# Patient Record
Sex: Female | Born: 1960 | Race: White | Hispanic: No | Marital: Married | Smoking: Never smoker
Health system: Southern US, Community
[De-identification: ages and names within clinical notes are randomized; demographics above are authoritative.]

## PROBLEM LIST (undated history)

## (undated) DIAGNOSIS — Z800 Family history of malignant neoplasm of digestive organs: Secondary | ICD-10-CM

## (undated) DIAGNOSIS — Z8042 Family history of malignant neoplasm of prostate: Secondary | ICD-10-CM

## (undated) DIAGNOSIS — S82899A Other fracture of unspecified lower leg, initial encounter for closed fracture: Secondary | ICD-10-CM

## (undated) DIAGNOSIS — E785 Hyperlipidemia, unspecified: Secondary | ICD-10-CM

## (undated) DIAGNOSIS — I517 Cardiomegaly: Secondary | ICD-10-CM

## (undated) DIAGNOSIS — I700 Atherosclerosis of aorta: Secondary | ICD-10-CM

## (undated) DIAGNOSIS — Z923 Personal history of irradiation: Secondary | ICD-10-CM

## (undated) DIAGNOSIS — K429 Umbilical hernia without obstruction or gangrene: Secondary | ICD-10-CM

## (undated) DIAGNOSIS — E669 Obesity, unspecified: Secondary | ICD-10-CM

## (undated) DIAGNOSIS — Z808 Family history of malignant neoplasm of other organs or systems: Secondary | ICD-10-CM

## (undated) DIAGNOSIS — K4090 Unilateral inguinal hernia, without obstruction or gangrene, not specified as recurrent: Secondary | ICD-10-CM

## (undated) DIAGNOSIS — G709 Myoneural disorder, unspecified: Secondary | ICD-10-CM

## (undated) DIAGNOSIS — Z803 Family history of malignant neoplasm of breast: Secondary | ICD-10-CM

## (undated) DIAGNOSIS — Z9989 Dependence on other enabling machines and devices: Secondary | ICD-10-CM

## (undated) DIAGNOSIS — Z8371 Family history of colonic polyps: Secondary | ICD-10-CM

## (undated) DIAGNOSIS — F419 Anxiety disorder, unspecified: Secondary | ICD-10-CM

## (undated) DIAGNOSIS — G629 Polyneuropathy, unspecified: Secondary | ICD-10-CM

## (undated) DIAGNOSIS — Z9221 Personal history of antineoplastic chemotherapy: Secondary | ICD-10-CM

## (undated) DIAGNOSIS — I10 Essential (primary) hypertension: Secondary | ICD-10-CM

## (undated) DIAGNOSIS — C541 Malignant neoplasm of endometrium: Secondary | ICD-10-CM

## (undated) DIAGNOSIS — D649 Anemia, unspecified: Secondary | ICD-10-CM

## (undated) DIAGNOSIS — Z8719 Personal history of other diseases of the digestive system: Secondary | ICD-10-CM

## (undated) DIAGNOSIS — Z83719 Family history of colon polyps, unspecified: Secondary | ICD-10-CM

## (undated) HISTORY — DX: Family history of malignant neoplasm of other organs or systems: Z80.8

## (undated) HISTORY — DX: Obesity, unspecified: E66.9

## (undated) HISTORY — DX: Personal history of irradiation: Z92.3

## (undated) HISTORY — DX: Family history of colon polyps, unspecified: Z83.719

## (undated) HISTORY — PX: CHOLECYSTECTOMY: SHX55

## (undated) HISTORY — PX: ANKLE FRACTURE SURGERY: SHX122

## (undated) HISTORY — DX: Family history of malignant neoplasm of breast: Z80.3

## (undated) HISTORY — DX: Hyperlipidemia, unspecified: E78.5

## (undated) HISTORY — DX: Family history of malignant neoplasm of digestive organs: Z80.0

## (undated) HISTORY — DX: Myoneural disorder, unspecified: G70.9

## (undated) HISTORY — DX: Family history of malignant neoplasm of prostate: Z80.42

## (undated) HISTORY — PX: REDUCTION MAMMAPLASTY: SUR839

## (undated) HISTORY — PX: TONSILLECTOMY: SUR1361

## (undated) HISTORY — DX: Family history of colonic polyps: Z83.71

---

## 1997-11-02 ENCOUNTER — Encounter: Payer: Self-pay | Admitting: Specialist

## 1997-11-02 ENCOUNTER — Inpatient Hospital Stay (HOSPITAL_COMMUNITY): Admission: EM | Admit: 1997-11-02 | Discharge: 1997-11-03 | Payer: Self-pay | Admitting: Emergency Medicine

## 1997-11-02 ENCOUNTER — Encounter: Payer: Self-pay | Admitting: Emergency Medicine

## 1998-06-07 ENCOUNTER — Emergency Department (HOSPITAL_COMMUNITY): Admission: EM | Admit: 1998-06-07 | Discharge: 1998-06-07 | Payer: Self-pay | Admitting: Emergency Medicine

## 2002-07-08 ENCOUNTER — Emergency Department (HOSPITAL_COMMUNITY): Admission: EM | Admit: 2002-07-08 | Discharge: 2002-07-09 | Payer: Self-pay | Admitting: Emergency Medicine

## 2004-05-07 ENCOUNTER — Other Ambulatory Visit: Admission: RE | Admit: 2004-05-07 | Discharge: 2004-05-07 | Payer: Self-pay | Admitting: Gynecology

## 2005-08-07 ENCOUNTER — Other Ambulatory Visit: Admission: RE | Admit: 2005-08-07 | Discharge: 2005-08-07 | Payer: Self-pay | Admitting: Gynecology

## 2007-02-21 ENCOUNTER — Encounter: Admission: RE | Admit: 2007-02-21 | Discharge: 2007-02-21 | Payer: Self-pay | Admitting: Gynecology

## 2009-04-23 ENCOUNTER — Ambulatory Visit: Payer: Self-pay | Admitting: Oncology

## 2009-04-24 ENCOUNTER — Ambulatory Visit (HOSPITAL_COMMUNITY): Admission: RE | Admit: 2009-04-24 | Discharge: 2009-04-24 | Payer: Self-pay | Admitting: Oncology

## 2009-04-24 LAB — COMPREHENSIVE METABOLIC PANEL
AST: 21 U/L (ref 0–37)
Albumin: 3.8 g/dL (ref 3.5–5.2)
Alkaline Phosphatase: 56 U/L (ref 39–117)
Calcium: 9.8 mg/dL (ref 8.4–10.5)
Chloride: 102 mEq/L (ref 96–112)
Potassium: 4.3 mEq/L (ref 3.5–5.3)
Sodium: 138 mEq/L (ref 135–145)
Total Protein: 7.3 g/dL (ref 6.0–8.3)

## 2009-04-24 LAB — CBC WITH DIFFERENTIAL/PLATELET
Basophils Absolute: 0 10*3/uL (ref 0.0–0.1)
EOS%: 0.6 % (ref 0.0–7.0)
Eosinophils Absolute: 0.1 10*3/uL (ref 0.0–0.5)
HCT: 43.2 % (ref 34.8–46.6)
HGB: 14.3 g/dL (ref 11.6–15.9)
MCH: 28.6 pg (ref 25.1–34.0)
MCV: 86.6 fL (ref 79.5–101.0)
MONO%: 7.3 % (ref 0.0–14.0)
NEUT#: 10 10*3/uL — ABNORMAL HIGH (ref 1.5–6.5)
NEUT%: 76.1 % (ref 38.4–76.8)
RDW: 16.6 % — ABNORMAL HIGH (ref 11.2–14.5)
lymph#: 2.1 10*3/uL (ref 0.9–3.3)

## 2009-04-24 LAB — MORPHOLOGY: PLT EST: ADEQUATE

## 2009-06-20 ENCOUNTER — Ambulatory Visit: Payer: Self-pay | Admitting: Oncology

## 2009-06-24 LAB — CBC WITH DIFFERENTIAL/PLATELET
BASO%: 0.5 % (ref 0.0–2.0)
HCT: 41.3 % (ref 34.8–46.6)
LYMPH%: 17.4 % (ref 14.0–49.7)
MCH: 28.8 pg (ref 25.1–34.0)
MCHC: 33.6 g/dL (ref 31.5–36.0)
MCV: 85.8 fL (ref 79.5–101.0)
MONO%: 7.9 % (ref 0.0–14.0)
NEUT%: 73.1 % (ref 38.4–76.8)
Platelets: 301 10*3/uL (ref 145–400)
RBC: 4.81 10*6/uL (ref 3.70–5.45)
WBC: 13 10*3/uL — ABNORMAL HIGH (ref 3.9–10.3)

## 2009-06-24 LAB — MORPHOLOGY: RBC Comments: NORMAL

## 2009-06-24 LAB — CHCC SMEAR

## 2009-08-26 ENCOUNTER — Ambulatory Visit: Payer: Self-pay | Admitting: Oncology

## 2009-08-28 LAB — CBC WITH DIFFERENTIAL/PLATELET
BASO%: 0.3 % (ref 0.0–2.0)
Basophils Absolute: 0 10*3/uL (ref 0.0–0.1)
EOS%: 1.1 % (ref 0.0–7.0)
Eosinophils Absolute: 0.1 10*3/uL (ref 0.0–0.5)
HCT: 43.3 % (ref 34.8–46.6)
HGB: 14.4 g/dL (ref 11.6–15.9)
LYMPH%: 15.5 % (ref 14.0–49.7)
MCH: 28.9 pg (ref 25.1–34.0)
MCHC: 33.3 g/dL (ref 31.5–36.0)
MCV: 86.9 fL (ref 79.5–101.0)
MONO#: 1.1 10*3/uL — ABNORMAL HIGH (ref 0.1–0.9)
MONO%: 8.9 % (ref 0.0–14.0)
NEUT#: 9 10*3/uL — ABNORMAL HIGH (ref 1.5–6.5)
NEUT%: 74.2 % (ref 38.4–76.8)
Platelets: 278 10*3/uL (ref 145–400)
RBC: 4.98 10*6/uL (ref 3.70–5.45)
RDW: 17.2 % — ABNORMAL HIGH (ref 11.2–14.5)
WBC: 12.2 10*3/uL — ABNORMAL HIGH (ref 3.9–10.3)
lymph#: 1.9 10*3/uL (ref 0.9–3.3)

## 2009-08-28 LAB — COMPREHENSIVE METABOLIC PANEL
ALT: 16 U/L (ref 0–35)
AST: 15 U/L (ref 0–37)
Albumin: 3.5 g/dL (ref 3.5–5.2)
Alkaline Phosphatase: 55 U/L (ref 39–117)
BUN: 13 mg/dL (ref 6–23)
CO2: 31 mEq/L (ref 19–32)
Calcium: 9.1 mg/dL (ref 8.4–10.5)
Chloride: 102 mEq/L (ref 96–112)
Creatinine, Ser: 0.8 mg/dL (ref 0.40–1.20)
Glucose, Bld: 107 mg/dL — ABNORMAL HIGH (ref 70–99)
Potassium: 4.3 mEq/L (ref 3.5–5.3)
Sodium: 140 mEq/L (ref 135–145)
Total Bilirubin: 0.6 mg/dL (ref 0.3–1.2)
Total Protein: 7.1 g/dL (ref 6.0–8.3)

## 2009-08-28 LAB — CHCC SMEAR

## 2013-09-12 ENCOUNTER — Encounter (HOSPITAL_COMMUNITY): Payer: Self-pay | Admitting: Emergency Medicine

## 2013-09-12 ENCOUNTER — Emergency Department (HOSPITAL_COMMUNITY)
Admission: EM | Admit: 2013-09-12 | Discharge: 2013-09-12 | Disposition: A | Discharge status: Home / Self Care | Payer: Commercial Indemnity | Attending: Emergency Medicine | Admitting: Emergency Medicine

## 2013-09-12 ENCOUNTER — Emergency Department (HOSPITAL_COMMUNITY): Payer: Commercial Indemnity

## 2013-09-12 DIAGNOSIS — R5381 Other malaise: Secondary | ICD-10-CM | POA: Insufficient documentation

## 2013-09-12 DIAGNOSIS — R404 Transient alteration of awareness: Secondary | ICD-10-CM | POA: Insufficient documentation

## 2013-09-12 DIAGNOSIS — I10 Essential (primary) hypertension: Secondary | ICD-10-CM | POA: Diagnosis not present

## 2013-09-12 DIAGNOSIS — R55 Syncope and collapse: Secondary | ICD-10-CM | POA: Diagnosis not present

## 2013-09-12 DIAGNOSIS — Z79899 Other long term (current) drug therapy: Secondary | ICD-10-CM | POA: Insufficient documentation

## 2013-09-12 DIAGNOSIS — R5383 Other fatigue: Secondary | ICD-10-CM | POA: Diagnosis not present

## 2013-09-12 DIAGNOSIS — R110 Nausea: Secondary | ICD-10-CM | POA: Insufficient documentation

## 2013-09-12 DIAGNOSIS — F411 Generalized anxiety disorder: Secondary | ICD-10-CM | POA: Diagnosis not present

## 2013-09-12 HISTORY — DX: Anxiety disorder, unspecified: F41.9

## 2013-09-12 HISTORY — DX: Essential (primary) hypertension: I10

## 2013-09-12 LAB — CBC WITH DIFFERENTIAL/PLATELET
BASOS ABS: 0 10*3/uL (ref 0.0–0.1)
BASOS PCT: 0 % (ref 0–1)
EOS PCT: 1 % (ref 0–5)
Eosinophils Absolute: 0.1 10*3/uL (ref 0.0–0.7)
HCT: 50.9 % — ABNORMAL HIGH (ref 36.0–46.0)
Hemoglobin: 17.2 g/dL — ABNORMAL HIGH (ref 12.0–15.0)
LYMPHS PCT: 12 % (ref 12–46)
Lymphs Abs: 1.7 10*3/uL (ref 0.7–4.0)
MCH: 32.8 pg (ref 26.0–34.0)
MCHC: 33.8 g/dL (ref 30.0–36.0)
MCV: 97.1 fL (ref 78.0–100.0)
MONO ABS: 0.9 10*3/uL (ref 0.1–1.0)
Monocytes Relative: 6 % (ref 3–12)
Neutro Abs: 11.3 10*3/uL — ABNORMAL HIGH (ref 1.7–7.7)
Neutrophils Relative %: 81 % — ABNORMAL HIGH (ref 43–77)
PLATELETS: 274 10*3/uL (ref 150–400)
RBC: 5.24 MIL/uL — ABNORMAL HIGH (ref 3.87–5.11)
RDW: 13.8 % (ref 11.5–15.5)
WBC: 13.9 10*3/uL — ABNORMAL HIGH (ref 4.0–10.5)

## 2013-09-12 LAB — COMPREHENSIVE METABOLIC PANEL
ALBUMIN: 3.9 g/dL (ref 3.5–5.2)
ALT: 23 U/L (ref 0–35)
AST: 22 U/L (ref 0–37)
Alkaline Phosphatase: 73 U/L (ref 39–117)
Anion gap: 17 — ABNORMAL HIGH (ref 5–15)
BUN: 13 mg/dL (ref 6–23)
CO2: 23 meq/L (ref 19–32)
CREATININE: 0.77 mg/dL (ref 0.50–1.10)
Calcium: 9.5 mg/dL (ref 8.4–10.5)
Chloride: 101 mEq/L (ref 96–112)
GFR calc Af Amer: >90 mL/min (ref >90)
GFR calc non Af Amer: >90 mL/min (ref >90)
Glucose, Bld: 83 mg/dL (ref 70–99)
Potassium: 4.3 mEq/L (ref 3.7–5.3)
SODIUM: 141 meq/L (ref 137–147)
Total Bilirubin: 0.7 mg/dL (ref 0.3–1.2)
Total Protein: 7.5 g/dL (ref 6.0–8.3)

## 2013-09-12 MED ORDER — SODIUM CHLORIDE 0.9 % IV BOLUS (SEPSIS)
1000.0000 mL | Freq: Once | INTRAVENOUS | Status: AC
  Administered 2013-09-12: 1000 mL via INTRAVENOUS

## 2013-09-12 NOTE — ED Notes (Signed)
MD at bedside. 

## 2013-09-12 NOTE — ED Notes (Signed)
Pt presents to department for evaluation of hypertension, dizziness, fatigue and syncope. States she felt bad all weekend and passed out in shower yesterday. Also reports severe anxiety. Pt is alert and oriented x4. Denies pain at the time. Respirations unlabored.

## 2013-09-12 NOTE — Discharge Instructions (Signed)
Syncope °Syncope is a medical term for fainting or passing out. This means you lose consciousness and drop to the ground. People are generally unconscious for less than 5 minutes. You may have some muscle twitches for up to 15 seconds before waking up and returning to normal. Syncope occurs more often in older adults, but it can happen to anyone. While most causes of syncope are not dangerous, syncope can be a sign of a serious medical problem. It is important to seek medical care.  °CAUSES  °Syncope is caused by a sudden drop in blood flow to the brain. The specific cause is often not determined. Factors that can bring on syncope include: °· Taking medicines that lower blood pressure. °· Sudden changes in posture, such as standing up quickly. °· Taking more medicine than prescribed. °· Standing in one place for too long. °· Seizure disorders. °· Dehydration and excessive exposure to heat. °· Low blood sugar (hypoglycemia). °· Straining to have a bowel movement. °· Heart disease, irregular heartbeat, or other circulatory problems. °· Fear, emotional distress, seeing blood, or severe pain. °SYMPTOMS  °Right before fainting, you may: °· Feel dizzy or light-headed. °· Feel nauseous. °· See all white or all black in your field of vision. °· Have cold, clammy skin. °DIAGNOSIS  °Your health care provider will ask about your symptoms, perform a physical exam, and perform an electrocardiogram (ECG) to record the electrical activity of your heart. Your health care provider may also perform other heart or blood tests to determine the cause of your syncope which may include: °· Transthoracic echocardiogram (TTE). During echocardiography, sound waves are used to evaluate how blood flows through your heart. °· Transesophageal echocardiogram (TEE). °· Cardiac monitoring. This allows your health care provider to monitor your heart rate and rhythm in real time. °· Holter monitor. This is a portable device that records your  heartbeat and can help diagnose heart arrhythmias. It allows your health care provider to track your heart activity for several days, if needed. °· Stress tests by exercise or by giving medicine that makes the heart beat faster. °TREATMENT  °In most cases, no treatment is needed. Depending on the cause of your syncope, your health care provider may recommend changing or stopping some of your medicines. °HOME CARE INSTRUCTIONS °· Have someone stay with you until you feel stable. °· Do not drive, use machinery, or play sports until your health care provider says it is okay. °· Keep all follow-up appointments as directed by your health care provider. °· Lie down right away if you start feeling like you might faint. Breathe deeply and steadily. Wait until all the symptoms have passed. °· Drink enough fluids to keep your urine clear or pale yellow. °· If you are taking blood pressure or heart medicine, get up slowly and take several minutes to sit and then stand. This can reduce dizziness. °SEEK IMMEDIATE MEDICAL CARE IF:  °· You have a severe headache. °· You have unusual pain in the chest, abdomen, or back. °· You are bleeding from your mouth or rectum, or you have black or tarry stool. °· You have an irregular or very fast heartbeat. °· You have pain with breathing. °· You have repeated fainting or seizure-like jerking during an episode. °· You faint when sitting or lying down. °· You have confusion. °· You have trouble walking. °· You have severe weakness. °· You have vision problems. °If you fainted, call your local emergency services (911 in [place] Do not drive   yourself to the hospital.  MAKE SURE YOU:  Understand these instructions.  Will watch your condition.  Will get help right away if you are not doing well or get worse. Document Released: 12/29/2004 Document Revised: 01/03/2013 Document Reviewed: 02/27/2011 [facility] Patient Information 2015 [place], [place]. This information is not intended to replace  advice given to you by your health care provider. Make sure you discuss any questions you have with your health care provider.   Emergency Department Resource Guide 1) Find a Doctor and Pay Out of Pocket Although you won't have to find out who is covered by your insurance plan, it is a good idea to ask around and get recommendations. You will then need to call the office and see if the doctor you have chosen will accept you as a new patient and what types of options they offer for patients who are self-pay. Some doctors offer discounts or will set up payment plans for their patients who do not have insurance, but you will need to ask so you aren't surprised when you get to your appointment.  2) Contact Your Local Health Department Not all health departments have doctors that can see patients for sick visits, but many do, so it is worth a call to see if yours does. If you don't know where your local health department is, you can check in your phone book. The CDC also has a tool to help you locate your state's health department, and many state websites also have listings of all of their local health departments.  3) Find a [facility] If your illness is not likely to be very severe or complicated, you may want to try a walk in clinic. These are popping up all over the country in pharmacies, drugstores, and shopping centers. They're usually staffed by nurse practitioners or physician assistants that have been trained to treat common illnesses and complaints. They're usually fairly quick and inexpensive. However, if you have serious medical issues or chronic medical problems, these are probably not your best option.  No Primary Care Doctor: - Call Health Connect at  [phone] - they can help you locate a primary care doctor that  accepts your insurance, provides certain services, etc. - Physician Referral Service- [phone]  Chronic Pain Problems: Organization         Address  Phone    Notes  [facility]  [phone] Patients need to be referred by their primary care doctor.   Medication Assistance: Organization         Address  Phone   Notes  [facility] Medication [facility] [address] [place], [place] [address] [phone] --Must be a resident of [facility] -- Must have NO insurance coverage whatsoever (no Medicaid/ Medicare, etc.) -- The pt. MUST have a primary care doctor that directs their care regularly and follows them in the community   [facility]  [phone]   [employer]  [phone]    Agencies that provide inexpensive medical care: Organization         Address  Phone   Notes  [place]  [phone]   [facility]    [phone]   [facility] [place], [place] [address] [phone]   [place] [address] [place] [phone]   [facility]    [phone]   [facility]    [phone])  [place]  [place] [address], [place] Phone:  [phone], Fax:  [phone] Hours of Operation:  9 am - 6 pm, M-F.  Also accepts Medicaid/Medicare and self-pay.  [facility] [place], [address], [place] Phone: [phone], Fax: [phone]. Hours of Operation:  8:30 am - 5:30 pm, M-F.  Also accepts Medicaid and self-pay.  [facility] [place] [address], [place] Phone: [phone]   [place], [place], [place] [phone], Ext. [phone] Mondays & Thursdays: 7-9 AM.  First 15 patients are seen on a first come, first serve basis.    [place] Providers:  Organization         Address  Phone   Notes  [facility] [address], [place] [phone] Also accepts self-pay patients.  [facility]  [address], [place]  [phone]   [place], [address], [place] [phone]   [facility] [address] [place] [phone]   Lucianne Lei [address], [place]   [phone] Only accepts [place] [facility] [place] patients after they have their name applied to their card.   Self-Pay (no insurance) in [facility]:  Organization         Address  Phone   Notes  Sickle Cell Patients, [facility] [place] [phone]   [facility] [place] [phone]   [facility] [place]  [place], [place], [place] [phone]   [facility]/Dr. Osei-Bonsu  [address] [place] or [address], [place] [phone] Phone number for both [place] and [employer] locations is the same.  Urgent [facility] and [facility] [address] [place] [phone]   [facility] [address] [place] or [address] [phone]   [facility] [address] [place] [phone], phone; [phone], fax Sees patients 1st and 3rd Saturday of every month.  Must not qualify for public or private insurance (i.e. Medicaid, Medicare, [place] [employer] Choice, Veterans' Benefits)  Household income should be no more than 200% of the poverty level The clinic cannot treat you if you are pregnant or think you are pregnant  Sexually transmitted diseases are not treated at the clinic.

## 2013-09-12 NOTE — ED Provider Notes (Signed)
CSN: [id number]     Arrival date & time 09/12/13  0906 History   First MD Initiated Contact with Patient 09/12/13 [phone]     Chief Complaint  Patient presents with  . Hypertension  . Loss of Consciousness     (Consider location/radiation/quality/duration/timing/severity/associated sxs/prior Treatment) Patient is a 53 y.o. female presenting with syncope.  Loss of Consciousness Episode history:  Single Most recent episode:  Yesterday Duration: very brief. Timing:  Constant Progression:  Unchanged Chronicity:  New Context comment:  Showering Witnessed: no   Relieved by:  Nothing Worsened by:  Nothing tried Ineffective treatments:  None tried Associated symptoms: anxiety and nausea   Associated symptoms: no chest pain, no difficulty breathing, no dizziness, no fever, no focal weakness, no headaches, no seizures, no shortness of breath and no vomiting     Past Medical History  Diagnosis Date  . Anxiety   . Hypertension    History reviewed. No pertinent past surgical history. No family history on file. History  Substance Use Topics  . Smoking status: Never Smoker   . Smokeless tobacco: Not on file  . Alcohol Use: No   OB History   Grav Para Term Preterm Abortions TAB SAB Ect Mult Living                 Review of Systems  Constitutional: Negative for fever and chills.  HENT: Negative for congestion, rhinorrhea and sore throat.   Eyes: Negative for photophobia and visual disturbance.  Respiratory: Negative for cough and shortness of breath.   Cardiovascular: Positive for syncope. Negative for chest pain and leg swelling.  Gastrointestinal: Positive for nausea. Negative for vomiting, abdominal pain, diarrhea and constipation.  Endocrine: Negative for polyphagia and polyuria.  Genitourinary: Negative for dysuria, flank pain, vaginal bleeding, vaginal discharge and enuresis.  Musculoskeletal: Negative for back pain and gait problem.  Skin: Negative for color change and rash.   Neurological: Negative for dizziness, focal weakness, seizures, syncope, light-headedness, numbness and headaches.  Hematological: Negative for adenopathy. Does not bruise/bleed easily.  All other systems reviewed and are negative.     Allergies  Review of patient's allergies indicates no known allergies.  Home Medications   Prior to Admission medications   Medication Sig Start Date End Date Taking? Authorizing Provider  ALPRAZolam Duanne Moron) 0.5 MG tablet Take 0.25 mg by mouth daily as needed for anxiety.   Yes Historical Provider, MD  citalopram (CELEXA) 40 MG tablet Take 20 mg by mouth 2 (two) times daily.   Yes Historical Provider, MD   BP 142/99  Pulse 77  Temp(Src) 99.1 F (37.3 C) (Oral)  Resp 20  SpO2 95%  LMP 08/29/2013 Physical Exam  Vitals reviewed. Constitutional: She is oriented to person, place, and time. She appears well-developed and well-nourished.  HENT:  Head: Normocephalic and atraumatic.  Right Ear: External ear normal.  Left Ear: External ear normal.  Eyes: Conjunctivae and EOM are normal. Pupils are equal, round, and reactive to light.  Neck: Normal range of motion. Neck supple.  Cardiovascular: Normal rate, regular rhythm, normal heart sounds and intact distal pulses.   Pulmonary/Chest: Effort normal and breath sounds normal.  Abdominal: Soft. Bowel sounds are normal. There is no tenderness.  Musculoskeletal: Normal range of motion.  Neurological: She is alert and oriented to person, place, and time. She has normal strength and normal reflexes. No cranial nerve deficit or sensory deficit. Coordination normal. GCS eye subscore is 4. GCS verbal subscore is 5. GCS motor subscore is  6.  Skin: Skin is warm and dry.    ED Course  Procedures (including critical care time) Labs Review Labs Reviewed  CBC WITH DIFFERENTIAL - Abnormal; Notable for the following:    WBC 13.9 (*)    RBC 5.24 (*)    Hemoglobin 17.2 (*)    HCT 50.9 (*)    Neutrophils Relative  % 81 (*)    Neutro Abs 11.3 (*)    All other components within normal limits  COMPREHENSIVE METABOLIC PANEL - Abnormal; Notable for the following:    Anion gap 17 (*)    All other components within normal limits    Imaging Review Dg Chest 2 View  09/12/2013   CLINICAL DATA:  Hypertension.  Loss of consciousness.  EXAM: CHEST  2 VIEW  COMPARISON:  None.  FINDINGS: Heart size and mediastinal contours are within normal limits. Both lungs are clear. Visualized skeletal structures are unremarkable.  IMPRESSION: Negative exam.   Electronically Signed   By: Inge Rise M.D.   On: 09/12/2013 10:41     EKG Interpretation   Date/Time:  Tuesday September 12 2013 09:22:49 EDT Ventricular Rate:  90 PR Interval:  124 QRS Duration: 82 QT Interval:  366 QTC Calculation: 447 R Axis:   67 Text Interpretation:  Normal sinus rhythm Possible Left atrial enlargement  Borderline ECG No old tracing to compare Confirmed by Debby Freiberg  [phone]) on 09/12/2013 10:56:34 AM      MDM   Final diagnoses:  Malaise  Syncope and collapse    53 y.o. female  with pertinent PMH of HTN, anxiety presents with syncopal episode yesterday and malaise x 1 week.  Syncope brief, associated with antecedent lightheadedness in shower, and pt lowered herself to the ground, no fall.  No fevers, vomiting, however pt had one episode of diarrhea last week.  No family history of sudden death.    Labs and imaging as above reviewed. CXR unremarkable, ECG unremarkable.  Given history and exam without orthostatic symptoms, likely vasovagal syncope. Doubt cardiogenic or neurogenic etiology given history and physical exam. Patient was given referrals to both her primary care and a cardiologist for Holter monitoring. She's given standard syncope return precautions, voiced understanding and agreed to followup  1. Malaise   2. Syncope and collapse         Debby Freiberg, MD 09/12/13 1058

## 2016-05-19 ENCOUNTER — Inpatient Hospital Stay (HOSPITAL_COMMUNITY)
Admission: AD | Admit: 2016-05-19 | Discharge: 2016-05-19 | Disposition: A | Discharge status: Home / Self Care | Payer: Commercial Indemnity | Source: Ambulatory Visit | Attending: Family Medicine | Admitting: Family Medicine

## 2016-05-19 ENCOUNTER — Encounter (HOSPITAL_COMMUNITY): Payer: Self-pay

## 2016-05-19 DIAGNOSIS — I10 Essential (primary) hypertension: Secondary | ICD-10-CM | POA: Insufficient documentation

## 2016-05-19 DIAGNOSIS — N939 Abnormal uterine and vaginal bleeding, unspecified: Secondary | ICD-10-CM

## 2016-05-19 DIAGNOSIS — D649 Anemia, unspecified: Secondary | ICD-10-CM | POA: Insufficient documentation

## 2016-05-19 DIAGNOSIS — N938 Other specified abnormal uterine and vaginal bleeding: Secondary | ICD-10-CM | POA: Diagnosis not present

## 2016-05-19 DIAGNOSIS — Z9049 Acquired absence of other specified parts of digestive tract: Secondary | ICD-10-CM | POA: Insufficient documentation

## 2016-05-19 DIAGNOSIS — F419 Anxiety disorder, unspecified: Secondary | ICD-10-CM | POA: Diagnosis not present

## 2016-05-19 LAB — WET PREP, GENITAL
Clue Cells Wet Prep HPF POC: NONE SEEN
Sperm: NONE SEEN
Trich, Wet Prep: NONE SEEN
Yeast Wet Prep HPF POC: NONE SEEN

## 2016-05-19 LAB — CBC
HCT: 24.6 % — ABNORMAL LOW (ref 36.0–46.0)
HEMOGLOBIN: 7.2 g/dL — AB (ref 12.0–15.0)
MCH: 19.9 pg — ABNORMAL LOW (ref 26.0–34.0)
MCHC: 29.3 g/dL — ABNORMAL LOW (ref 30.0–36.0)
MCV: 68.1 fL — AB (ref 78.0–100.0)
Platelets: 331 10*3/uL (ref 150–400)
RBC: 3.61 MIL/uL — AB (ref 3.87–5.11)
RDW: 21.1 % — AB (ref 11.5–15.5)
WBC: 10.3 10*3/uL (ref 4.0–10.5)

## 2016-05-19 MED ORDER — SODIUM CHLORIDE 0.9 % IV SOLN
INTRAVENOUS | Status: DC
  Administered 2016-05-19: 75 mL/h via INTRAVENOUS

## 2016-05-19 MED ORDER — SODIUM CHLORIDE 0.9 % IV SOLN
510.0000 mg | Freq: Once | INTRAVENOUS | Status: AC
  Administered 2016-05-19: 510 mg via INTRAVENOUS
  Filled 2016-05-19: qty 17

## 2016-05-19 MED ORDER — MEGESTROL ACETATE 40 MG PO TABS: ORAL_TABLET | ORAL | 0 refills | Status: DC

## 2016-05-19 NOTE — Discharge Instructions (Signed)

## 2016-05-19 NOTE — Progress Notes (Signed)
56 y/o presents to triage for dizziness, some nausea, and vaginal bleeding. Recently seen in the ER in [place] Sunday for syncopy episode and was given IV bolus infusion.

## 2016-05-19 NOTE — MAU Provider Note (Signed)
History     CSN: [id number]  Arrival date and time: 05/19/16 1924   First Provider Initiated Contact with Patient 05/19/16 2014      Chief Complaint  Patient presents with  . Vaginal Bleeding   Ms. Misty [name] is a 56 yo G2P2002 non-pregnant GYN presenting with complaints of heavy vaginal bleeding with clots since Sunday.  She had a syncopal episode Sunday.  She was seen at an ED in [place].  She was told she was dehydrated, given IVFs and sent home.  She has soaked "multiple washcloths" every hour since Sunday. She also reports light-headedness and dizziness today. She reports having irregular cycles that are just as heavy as this one, but Sunday was the heaviest she has ever had.  She states the ED MD told her to get a pelvic u/s scheduled once she returned to [place]. She is establishing care at [facility] and has an appt scheduled for 05/27/2016.  She experiences occasional nausea and decreased appetite since Sunday.  She reports having "high BPs when she goes to the doctor, but her BP always comes back down to normal." L:ast IC was 3 wks ago.   Vaginal Bleeding  The patient's primary symptoms include vaginal bleeding. This is a chronic problem. The current episode started in the past 7 days. Episode frequency: every hour. The problem has been unchanged. The patient is experiencing no pain. She is not pregnant. Associated symptoms include nausea. The vaginal discharge was bloody. The vaginal bleeding is heavier than menses. She has been passing clots. She has not been passing tissue. Nothing aggravates the symptoms. She has tried nothing for the symptoms. She is sexually active (Last IC 3 wks ago). No, her partner does not have an STD. She uses nothing for contraception. Her menstrual history has been irregular. Her past medical history is significant for an abdominal surgery (cholecystectomy "decades ago"). (Tonsillectomy "decades ago")     Past Medical History:  Diagnosis Date  . Anxiety   .  Hypertension     History reviewed. No pertinent surgical history.  History reviewed. No pertinent family history.  Social History  Substance Use Topics  . Smoking status: Never Smoker  . Smokeless tobacco: Never Used  . Alcohol use No    Allergies: No Known Allergies  Prescriptions Prior to Admission  Medication Sig Dispense Refill Last Dose  . ALPRAZolam (XANAX) 0.5 MG tablet Take 0.25 mg by mouth daily as needed for anxiety.   09/12/2013 at Unknown time  . citalopram (CELEXA) 40 MG tablet Take 20 mg by mouth 2 (two) times daily.   09/12/2013 at Unknown time    Review of Systems  Constitutional: Positive for appetite change (decreased since Sunday).  HENT: Negative.   Eyes: Negative.   Respiratory: Negative.   Cardiovascular: Negative.   Gastrointestinal: Positive for nausea.  Endocrine: Negative.   Genitourinary: Positive for vaginal bleeding (heavy and passing clots).  Musculoskeletal: Negative.   Skin: Negative.   Allergic/Immunologic: Negative.   Neurological: Positive for syncope (Sunday, none now).  Psychiatric/Behavioral: Negative.    Physical Exam   Blood pressure (!) 157/98, pulse 82, temperature 98.5 F (36.9 C), temperature source Oral, resp. rate 20, last menstrual period 05/16/2016, SpO2 100 %.  Physical Exam  Constitutional: She is oriented to person, place, and time. She appears well-developed and well-nourished.  obesity  HENT:  Head: Normocephalic.  Eyes: Pupils are equal, round, and reactive to light.  Neck: Normal range of motion.  Cardiovascular: Normal rate, regular  rhythm, normal heart sounds and intact distal pulses.   Respiratory: Effort normal and breath sounds normal.  GI: Soft. Bowel sounds are normal.  Genitourinary:  Genitourinary Comments: Difficult assessment of uterus d/t body habitus, no adnexal tenderness, no CMT or friability  Musculoskeletal: Normal range of motion.  Neurological: She is alert and oriented to person, place, and  time. She has normal reflexes.  Skin: Skin is warm and dry.  Psychiatric: She has a normal mood and affect. Her behavior is normal. Judgment and thought content normal.   Results for orders placed or performed during the hospital encounter of 05/19/16 (from the past 24 hour(s))  CBC     Status: Abnormal   Collection Time: 05/19/16  8:17 PM  Result Value Ref Range   WBC 10.3 4.0 - 10.5 K/uL   RBC 3.61 (L) 3.87 - 5.11 MIL/uL   Hemoglobin 7.2 (L) 12.0 - 15.0 g/dL   HCT 24.6 (L) 36.0 - 46.0 %   MCV 68.1 (L) 78.0 - 100.0 fL   MCH 19.9 (L) 26.0 - 34.0 pg   MCHC 29.3 (L) 30.0 - 36.0 g/dL   RDW 21.1 (H) 11.5 - 15.5 %   Platelets 331 150 - 400 K/uL  Wet prep, genital     Status: Abnormal   Collection Time: 05/19/16  8:35 PM  Result Value Ref Range   Yeast Wet Prep HPF POC NONE SEEN NONE SEEN   Trich, Wet Prep NONE SEEN NONE SEEN   Clue Cells Wet Prep HPF POC NONE SEEN NONE SEEN   WBC, Wet Prep HPF POC FEW (A) NONE SEEN   Sperm NONE SEEN    MAU Course  Procedures  MDM CBC Wet prep GC/CT Feraheme 510 mg IVPB NS 0.9% at 75 ml/hr until iron infusion complete  Assessment and Plan  56 yo G2P2002 AUB Anemia  * Care assumed by Donaciano Eva, MD  Laury Deep MSN, CNM 05/19/2016, 9:09 PM   Patient tolerated feraheme infusion well.   OK to D/C home- follow up in office  [place] until seen in office.  Jacquiline Doe, MD 05/20/16 1:03 PM

## 2016-05-19 NOTE — MAU Note (Signed)
Pt states that she started having some vaginal bleeding about 3 days ago. States she was recently seen in an ER in [place] on Sunday for a syncopal episode. States she was told she was dehydrated and that was the reasoning for passing out. States today she feels lightheaded and dizzy. States she has soaked through multiple washcloths today. Pt denies pain, occasional nausea.

## 2016-05-19 NOTE — Progress Notes (Addendum)
Provider at bs assessing pt. Wet prep, GC and pelvic exam done.   2112: Provider aware of resulted labs.   2130: IV insertion attempted twice. Both times vein blew. Another nurse notified for assistance.   2240: Discharge instructions given with pt understanding. Pt left via wheelchair to car with husband.

## 2016-05-20 LAB — GC/CHLAMYDIA PROBE AMP (~~LOC~~) NOT AT ARMC
CHLAMYDIA, DNA PROBE: NEGATIVE
NEISSERIA GONORRHEA: NEGATIVE

## 2016-06-17 ENCOUNTER — Encounter: Payer: Self-pay | Admitting: Gynecologic Oncology

## 2016-06-17 ENCOUNTER — Ambulatory Visit: Payer: Managed Care, Other (non HMO) | Attending: Gynecologic Oncology | Admitting: Gynecologic Oncology

## 2016-06-17 VITALS — BP 155/85 | HR 66 | Temp 98.5°F | Resp 20 | Ht 64.06 in | Wt 298.3 lb

## 2016-06-17 DIAGNOSIS — C541 Malignant neoplasm of endometrium: Secondary | ICD-10-CM | POA: Diagnosis present

## 2016-06-17 DIAGNOSIS — F419 Anxiety disorder, unspecified: Secondary | ICD-10-CM | POA: Insufficient documentation

## 2016-06-17 DIAGNOSIS — D500 Iron deficiency anemia secondary to blood loss (chronic): Secondary | ICD-10-CM | POA: Diagnosis not present

## 2016-06-17 DIAGNOSIS — Z79899 Other long term (current) drug therapy: Secondary | ICD-10-CM | POA: Insufficient documentation

## 2016-06-17 DIAGNOSIS — I10 Essential (primary) hypertension: Secondary | ICD-10-CM | POA: Insufficient documentation

## 2016-06-17 DIAGNOSIS — Z803 Family history of malignant neoplasm of breast: Secondary | ICD-10-CM | POA: Insufficient documentation

## 2016-06-17 DIAGNOSIS — Z6841 Body Mass Index (BMI) 40.0 and over, adult: Secondary | ICD-10-CM | POA: Diagnosis not present

## 2016-06-17 NOTE — Progress Notes (Signed)
Consult Note: Gyn-Onc  Consult was requested by Dr. Ulanda Edison for the evaluation of Misty [name] 56 y.o. female  CC:  Chief Complaint  Patient presents with  . Endometrial Cancer    Assessment/Plan:  Misty [name]  is a 56 y.o.  year old with grade 2 endometrial cancer and morbid obesity (BMI 52).   A detailed discussion was held with the patient and her family with regard to to her endometrial cancer diagnosis. We discussed the standard management options for uterine cancer which includes surgery followed possibly by adjuvant therapy depending on the results of surgery. The options for surgical management include a hysterectomy and removal of the tubes and ovaries possibly with removal of pelvic and para-aortic lymph nodes.If feasible, a minimally invasive approach including a robotic hysterectomy or laparoscopic hysterectomy have benefits including shorter hospital stay, recovery time and better wound healing than with open surgery. The patient has been counseled about these surgical options and the risks of surgery in general including infection, bleeding, damage to surrounding structures (including bowel, bladder, ureters, nerves or vessels), and the postoperative risks of PE/ DVT, and lymphedema. I extensively reviewed the additional risks of robotic hysterectomy including possible need for conversion to open laparotomy.  I discussed positioning during surgery of trendelenberg and risks of minor facial swelling and care we take in preoperative positioning.  After counseling and consideration of her options, she desires to proceed with robotic assisted total hysterectomy with bilateral sapingo-oophorectomy and SLN biopsy. I discussed that risks are higher in patients who have extreme morbid obesity.  She will be seen by anesthesia for preoperative clearance and discussion of postoperative pain management.  She was given the opportunity to ask questions, which were answered to her  satisfaction, and she is agreement with the above mentioned plan of care.  She has chronic anemia from persistent blood loss. Recommend continuing iron replacement until the time of surgery. Now that her bleeding has improved she should have improved Hx prior to surgery.   HPI: Misty [name] is a 56 year old woman who is seen in consultation at the request of Dr Ulanda Edison for grade 2 endometrial cancer.  The patient reports abnormal uterine bleeding for approximately 6 years (2-4 bleeding episodes per year). She had a particularly heavy bleeding episode on 05/21/16 and was seen in the ED. Hb was 7.2g/dL. She was started on Megace. Her bleeding improved.  [place] on 05/29/16 showed a uterus with 9x6x5.7cm dimensions and an endometrial stripe of 21mm.  Endometrial pipelle biopsy on 06/05/16 showed a grade 2 endometrial cancer.  Current Meds:  Outpatient Encounter Prescriptions as of 06/17/2016  Medication Sig  . ALPRAZolam (XANAX) 0.5 MG tablet Take 0.25 mg by mouth daily as needed for anxiety.  . busPIRone (BUSPAR) 15 MG tablet Take 15 mg by mouth 2 (two) times daily.  Marland Kitchen escitalopram (LEXAPRO) 20 MG tablet Take 20 mg by mouth daily.  . IRON PO Take 65 mg by mouth daily.  . medroxyPROGESTERone (PROVERA) 10 MG tablet Take 10 mg by mouth daily.  . megestrol (MEGACE) 40 MG tablet Take 80 mg by mouth twice daily x 3 days, then 40 mg by mouth twice daily until seen on 05/27/2016  . [DISCONTINUED] citalopram (CELEXA) 40 MG tablet Take 20 mg by mouth 2 (two) times daily.   No facility-administered encounter medications on file as of 06/17/2016.     Allergy: No Known Allergies  Social Hx:   Social History   Social History  .  Marital status: Married    Spouse name: N/A  . Number of children: N/A  . Years of education: N/A   Occupational History  . Not on file.   Social History Main Topics  . Smoking status: Never Smoker  . Smokeless tobacco: Never Used  . Alcohol use No  . Drug use: No  .  Sexual activity: Not on file   Other Topics Concern  . Not on file   Social History Narrative  . No narrative on file    Past Surgical Hx: History reviewed. No pertinent surgical history.  Past Medical Hx:  Past Medical History:  Diagnosis Date  . Anxiety   . Hypertension     Past Gynecological History:  SVD x 2 No LMP recorded.  Family Hx: History reviewed. No pertinent family history.  Patient's oldest and youngest sister have history of premenopausal breast cancer (she is one of 8 siblings).  Review of Systems:  Constitutional  Feels well,    ENT Normal appearing ears and nares bilaterally Skin/Breast  No rash, sores, jaundice, itching, dryness Cardiovascular  No chest pain, shortness of breath, or edema  Pulmonary  No cough or wheeze.  Gastro Intestinal  No nausea, vomitting, or diarrhoea. No bright red blood per rectum, no abdominal pain, change in bowel movement, or constipation.  Genito Urinary  No frequency, urgency, dysuria, + heavy vaginal bleeding. Musculo Skeletal  No myalgia, arthralgia, joint swelling or pain  Neurologic  No weakness, numbness, change in gait,  Psychology  No depression, anxiety, insomnia.   labratory assessment: CBC    Component Value Date/Time   WBC 10.3 05/19/2016 2017   RBC 3.61 (L) 05/19/2016 2017   HGB 7.2 (L) 05/19/2016 2017   HGB 14.4 08/28/2009 1342   HCT 24.6 (L) 05/19/2016 2017   HCT 43.3 08/28/2009 1342   PLT 331 05/19/2016 2017   PLT 278 08/28/2009 1342   MCV 68.1 (L) 05/19/2016 2017   MCV 86.9 08/28/2009 1342   MCH 19.9 (L) 05/19/2016 2017   MCHC 29.3 (L) 05/19/2016 2017   RDW 21.1 (H) 05/19/2016 2017   RDW 17.2 (H) 08/28/2009 1342   LYMPHSABS 1.7 09/12/2013 0940   LYMPHSABS 1.9 08/28/2009 1342   MONOABS 0.9 09/12/2013 0940   MONOABS 1.1 (H) 08/28/2009 1342   EOSABS 0.1 09/12/2013 0940   EOSABS 0.1 08/28/2009 1342   BASOSABS 0.0 09/12/2013 0940   BASOSABS 0.0 08/28/2009 1342    Vitals:  Blood  pressure (!) 155/85, pulse 66, temperature 98.5 F (36.9 C), resp. rate 20, height 5' 4.06" (1.627 m), weight 298 lb 4.8 oz (135.3 kg).  Physical Exam: WD in NAD Neck  Supple NROM, without any enlargements.  Lymph Node Survey No cervical supraclavicular or inguinal adenopathy Cardiovascular  Pulse normal rate, regularity and rhythm. S1 and S2 normal.  Lungs  Clear to auscultation bilateraly, without wheezes/crackles/rhonchi. Good air movement.  Skin  No rash/lesions/breakdown  Psychiatry  Alert and oriented to person, place, and time  Abdomen  Normoactive bowel sounds, abdomen soft, non-tender and obese with pannus without vidence of hernia.  Back No CVA tenderness Genito Urinary  Vulva/vagina: Normal external female genitalia.  No lesions. No discharge or bleeding.  Bladder/urethra:  No lesions or masses, well supported bladder  Vagina: normal, some prolaps  Cervix: Normal appearing, no lesions.  Uterus:  Small, mobile, no parametrial involvement or nodularity.  Adnexa: no discretely palpable masses. Rectal  deferred Extremities  No bilateral cyanosis, clubbing or edema.   Everitt Amber  Chrys Racer, MD  06/17/2016, 12:26 PM

## 2016-06-17 NOTE — Patient Instructions (Signed)
Preparing for your Surgery  Plan for surgery on June 25, 2016 with Dr. Everitt Amber at [facility] will be scheduled for a robotic assisted total hysterectomy, bilateral salpingo-oophorectomy, sentinel lymph node biopsy.  Pre-operative Testing -You will receive a phone call from presurgical testing at [facility] to arrange for a pre-operative testing appointment before your surgery.  This appointment normally occurs one to two weeks before your scheduled surgery.   -Bring your insurance card, copy of an advanced directive if applicable, medication list  -At that visit, you will be asked to sign a consent for a possible blood transfusion in case a transfusion becomes necessary during surgery.  The need for a blood transfusion is rare but having consent is a necessary part of your care.     -You should not be taking blood thinners or aspirin at least ten days prior to surgery unless instructed by your surgeon.  Day Before Surgery at [place] will be asked to take in a light diet the day before surgery.  Avoid carbonated beverages.  You will be advised to have nothing to eat or drink after midnight the evening before.     Eat a light diet the day before surgery.  Examples including soups, broths, toast, yogurt, mashed potatoes.  Things to avoid include carbonated beverages (fizzy beverages), raw fruits and raw vegetables, or beans.    If your bowels are filled with gas, your surgeon will have difficulty visualizing your pelvic organs which increases your surgical risks.  Your role in recovery Your role is to become active as soon as directed by your doctor, while still giving yourself time to heal.  Rest when you feel tired. You will be asked to do the following in order to speed your recovery:  - Cough and breathe deeply. This helps toclear and expand your lungs and can prevent pneumonia. You may be given a spirometer to practice deep breathing. A staff  member will show you how to use the spirometer. - Do mild physical activity. Walking or moving your legs help your circulation and body functions return to normal. A staff member will help you when you try to walk and will provide you with simple exercises. Do not try to get up or walk alone the first time. - Actively manage your pain. Managing your pain lets you move in comfort. We will ask you to rate your pain on a scale of zero to 10. It is your responsibility to tell your doctor or nurse where and how much you hurt so your pain can be treated.  Special Considerations -If you are diabetic, you may be placed on insulin after surgery to have closer control over your blood sugars to promote healing and recovery.  This does not mean that you will be discharged on insulin.  If applicable, your oral antidiabetics will be resumed when you are tolerating a solid diet.  -Your final pathology results from surgery should be available by the Friday after surgery and the results will be relayed to you when available.   Blood Transfusion Information WHAT IS A BLOOD TRANSFUSION? A transfusion is the replacement of blood or some of its parts. Blood is made up of multiple cells which provide different functions.  Red blood cells carry oxygen and are used for blood loss replacement.  White blood cells fight against infection.  Platelets control bleeding.  Plasma helps clot blood.  Other blood products are available for specialized needs, such as hemophilia  or other clotting disorders. BEFORE THE TRANSFUSION  Who gives blood for transfusions?   You may be able to donate blood to be used at a later date on yourself (autologous donation).  Relatives can be asked to donate blood. This is generally not any safer than if you have received blood from a stranger. The same precautions are taken to ensure safety when a relative's blood is donated.  Healthy volunteers who are fully evaluated to make sure their  blood is safe. This is blood bank blood. Transfusion therapy is the safest it has ever been in the practice of medicine. Before blood is taken from a donor, a complete history is taken to make sure that person has no history of diseases nor engages in risky social behavior (examples are intravenous drug use or sexual activity with multiple partners). The donor's travel history is screened to minimize risk of transmitting infections, such as malaria. The donated blood is tested for signs of infectious diseases, such as HIV and hepatitis. The blood is then tested to be sure it is compatible with you in order to minimize the chance of a transfusion reaction. If you or a relative donates blood, this is often done in anticipation of surgery and is not appropriate for emergency situations. It takes many days to process the donated blood. RISKS AND COMPLICATIONS Although transfusion therapy is very safe and saves many lives, the main dangers of transfusion include:   Getting an infectious disease.  Developing a transfusion reaction. This is an allergic reaction to something in the blood you were given. Every precaution is taken to prevent this. The decision to have a blood transfusion has been considered carefully by your caregiver before blood is given. Blood is not given unless the benefits outweigh the risks.

## 2016-06-22 NOTE — Patient Instructions (Addendum)
Misty [name]  06/22/2016   Your procedure is scheduled on: 06-25-16  Report to [facility] Main  Entrance Take [place]  Elevators to 3rd floor to  [place] at 12;15 PM.    Call this number if you have problems the morning of surgery [phone]    Remember: ONLY 1 PERSON MAY GO WITH YOU TO SHORT STAY TO GET  READY MORNING OF [place].  Do not eat food or drink liquids :After Midnight. You may have a Clear Liquid Diet from Midnight until 8:15 AM. After 8:15 AM, nothing by mouth.     CLEAR LIQUID DIET   Foods Allowed                                                                     Foods Excluded  Coffee and tea, regular and decaf                             liquids that you cannot  Plain Jell-O in any flavor                                             see through such as: Fruit ices (not with fruit pulp)                                     milk, soups, orange juice  Iced Popsicles                                    All solid food Carbonated beverages, regular and diet                                    Cranberry, grape and apple juices Sports drinks like Gatorade Lightly seasoned clear broth or consume(fat free) Sugar, honey syrup  Sample Menu Breakfast                                Lunch                                     Supper Cranberry juice                    Beef broth                            Chicken broth Jell-O                                     Grape juice  Apple juice Coffee or tea                        Jell-O                                      Popsicle                                                Coffee or tea                        Coffee or tea  _____________________________________________________________________     Take these medicines the morning of surgery with A SIP OF WATER: Buspirone (Buspar, Escitalopram (Lexapro) and Xanax as needed.                                You may not  have any metal on your body including hair pins and              piercings  Do not wear jewelry, make-up, lotions, powders or perfumes, deodorant             Do not wear nail polish.  Do not shave  48 hours prior to surgery.  .   Do not bring valuables to the hospital. [place].  Contacts, dentures or bridgework may not be worn into surgery.  Leave suitcase in the car. After surgery it may be brought to your room.      Please read over the following fact sheets you were given: _____________________________________________________________________             [facility] - Preparing for Surgery Before surgery, you can play an important role.  Because skin is not sterile, your skin needs to be as free of germs as possible.  You can reduce the number of germs on your skin by washing with CHG (chlorahexidine gluconate) soap before surgery.  CHG is an antiseptic cleaner which kills germs and bonds with the skin to continue killing germs even after washing. Please DO NOT use if you have an allergy to CHG or antibacterial soaps.  If your skin becomes reddened/irritated stop using the CHG and inform your nurse when you arrive at Short Stay. Do not shave (including legs and underarms) for at least 48 hours prior to the first CHG shower.  You may shave your face/neck. Please follow these instructions carefully:  1.  Shower with CHG Soap the night before surgery and the  morning of Surgery.  2.  If you choose to wash your hair, wash your hair first as usual with your  normal  shampoo.  3.  After you shampoo, rinse your hair and body thoroughly to remove the  shampoo.                           4.  Use CHG as you would any other liquid soap.  You can apply chg directly  to the skin and wash  Gently with a scrungie or clean washcloth.  5.  Apply the CHG Soap to your body ONLY FROM THE NECK DOWN.   Do not use on face/ open                            Wound or open sores. Avoid contact with eyes, ears mouth and genitals (private parts).                       Wash face,  Genitals (private parts) with your normal soap.             6.  Wash thoroughly, paying special attention to the area where your surgery  will be performed.  7.  Thoroughly rinse your body with warm water from the neck down.  8.  DO NOT shower/wash with your normal soap after using and rinsing off  the CHG Soap.                9.  Pat yourself dry with a clean towel.            10.  Wear clean pajamas.            11.  Place clean sheets on your bed the night of your first shower and do not  sleep with pets. Day of Surgery : Do not apply any lotions/deodorants the morning of surgery.  Please wear clean clothes to the hospital/surgery center.  FAILURE TO FOLLOW THESE INSTRUCTIONS MAY RESULT IN THE CANCELLATION OF YOUR SURGERY PATIENT SIGNATURE_________________________________  NURSE SIGNATURE__________________________________  ________________________________________________________________________  WHAT IS A BLOOD TRANSFUSION? Blood Transfusion Information  A transfusion is the replacement of blood or some of its parts. Blood is made up of multiple cells which provide different functions.  Red blood cells carry oxygen and are used for blood loss replacement.  White blood cells fight against infection.  Platelets control bleeding.  Plasma helps clot blood.  Other blood products are available for specialized needs, such as hemophilia or other clotting disorders. BEFORE THE TRANSFUSION  Who gives blood for transfusions?   Healthy volunteers who are fully evaluated to make sure their blood is safe. This is blood bank blood. Transfusion therapy is the safest it has ever been in the practice of medicine. Before blood is taken from a donor, a complete history is taken to make sure that person has no history of diseases nor engages in risky social behavior  (examples are intravenous drug use or sexual activity with multiple partners). The donor's travel history is screened to minimize risk of transmitting infections, such as malaria. The donated blood is tested for signs of infectious diseases, such as HIV and hepatitis. The blood is then tested to be sure it is compatible with you in order to minimize the chance of a transfusion reaction. If you or a relative donates blood, this is often done in anticipation of surgery and is not appropriate for emergency situations. It takes many days to process the donated blood. RISKS AND COMPLICATIONS Although transfusion therapy is very safe and saves many lives, the main dangers of transfusion include:   Getting an infectious disease.  Developing a transfusion reaction. This is an allergic reaction to something in the blood you were given. Every precaution is taken to prevent this. The decision to have a blood transfusion has been considered carefully by your caregiver before blood is given. Blood is not given unless the benefits  outweigh the risks. AFTER THE TRANSFUSION  Right after receiving a blood transfusion, you will usually feel much better and more energetic. This is especially true if your red blood cells have gotten low (anemic). The transfusion raises the level of the red blood cells which carry oxygen, and this usually causes an energy increase.  The nurse administering the transfusion will monitor you carefully for complications. HOME CARE INSTRUCTIONS  No special instructions are needed after a transfusion. You may find your energy is better. Speak with your caregiver about any limitations on activity for underlying diseases you may have. SEEK MEDICAL CARE IF:   Your condition is not improving after your transfusion.  You develop redness or irritation at the intravenous (IV) site. SEEK IMMEDIATE MEDICAL CARE IF:  Any of the following symptoms occur over the next 12 hours:  Shaking  chills.  You have a temperature by mouth above 102 F (38.9 C), not controlled by medicine.  Chest, back, or muscle pain.  People around you feel you are not acting correctly or are confused.  Shortness of breath or difficulty breathing.  Dizziness and fainting.  You get a rash or develop hives.  You have a decrease in urine output.  Your urine turns a dark color or changes to pink, red, or brown. Any of the following symptoms occur over the next 10 days:  You have a temperature by mouth above 102 F (38.9 C), not controlled by medicine.  Shortness of breath.  Weakness after normal activity.  The white part of the eye turns yellow (jaundice).  You have a decrease in the amount of urine or are urinating less often.  Your urine turns a dark color or changes to pink, red, or brown. Document Released: 12/27/1999 Document Revised: 03/23/2011 Document Reviewed: 08/15/2007 ExitCare Patient Information 2014 [place].  _______________________________________________________________________   Incentive Spirometer  An incentive spirometer is a tool that can help keep your lungs clear and active. This tool measures how well you are filling your lungs with each breath. Taking long deep breaths may help reverse or decrease the chance of developing breathing (pulmonary) problems (especially infection) following:  A long period of time when you are unable to move or be active. BEFORE THE PROCEDURE   If the spirometer includes an indicator to show your best effort, your nurse or respiratory therapist will set it to a desired goal.  If possible, sit up straight or lean slightly forward. Try not to slouch.  Hold the incentive spirometer in an upright position. INSTRUCTIONS FOR USE  1. Sit on the edge of your bed if possible, or sit up as far as you can in bed or on a chair. 2. Hold the incentive spirometer in an upright position. 3. Breathe out normally. 4. Place the  mouthpiece in your mouth and seal your lips tightly around it. 5. Breathe in slowly and as deeply as possible, raising the piston or the ball toward the top of the column. 6. Hold your breath for 3-5 seconds or for as long as possible. Allow the piston or ball to fall to the bottom of the column. 7. Remove the mouthpiece from your mouth and breathe out normally. 8. Rest for a few seconds and repeat Steps 1 through 7 at least 10 times every 1-2 hours when you are awake. Take your time and take a few normal breaths between deep breaths. 9. The spirometer may include an indicator to show your best effort. Use the indicator as a goal  to work toward during each repetition. 10. After each set of 10 deep breaths, practice coughing to be sure your lungs are clear. If you have an incision (the cut made at the time of surgery), support your incision when coughing by placing a pillow or rolled up towels firmly against it. Once you are able to get out of bed, walk around indoors and cough well. You may stop using the incentive spirometer when instructed by your caregiver.  RISKS AND COMPLICATIONS  Take your time so you do not get dizzy or light-headed.  If you are in pain, you may need to take or ask for pain medication before doing incentive spirometry. It is harder to take a deep breath if you are having pain. AFTER USE  Rest and breathe slowly and easily.  It can be helpful to keep track of a log of your progress. Your caregiver can provide you with a simple table to help with this. If you are using the spirometer at home, follow these instructions: [place] IF:   You are having difficultly using the spirometer.  You have trouble using the spirometer as often as instructed.  Your pain medication is not giving enough relief while using the spirometer.  You develop fever of 100.5 F (38.1 C) or higher. SEEK IMMEDIATE MEDICAL CARE IF:   You cough up bloody sputum that had not been present  before.  You develop fever of 102 F (38.9 C) or greater.  You develop worsening pain at or near the incision site. MAKE SURE YOU:   Understand these instructions.  Will watch your condition.  Will get help right away if you are not doing well or get worse. Document Released: 05/11/2006 Document Revised: 03/23/2011 Document Reviewed: 07/12/2006 [facility] Patient Information 2014 [place], [place].   ________________________________________________________________________

## 2016-06-23 ENCOUNTER — Encounter (HOSPITAL_COMMUNITY)
Admission: RE | Admit: 2016-06-23 | Discharge: 2016-06-23 | Disposition: A | Discharge status: Home / Self Care | Payer: Managed Care, Other (non HMO) | Source: Ambulatory Visit | Attending: Gynecologic Oncology | Admitting: Gynecologic Oncology

## 2016-06-23 ENCOUNTER — Encounter (HOSPITAL_COMMUNITY): Payer: Self-pay

## 2016-06-23 ENCOUNTER — Ambulatory Visit (HOSPITAL_COMMUNITY)
Admission: RE | Admit: 2016-06-23 | Discharge: 2016-06-23 | Disposition: A | Discharge status: Home / Self Care | Payer: Managed Care, Other (non HMO) | Source: Ambulatory Visit | Attending: Gynecologic Oncology | Admitting: Gynecologic Oncology

## 2016-06-23 ENCOUNTER — Encounter (INDEPENDENT_AMBULATORY_CARE_PROVIDER_SITE_OTHER): Payer: Self-pay

## 2016-06-23 DIAGNOSIS — Z01818 Encounter for other preprocedural examination: Secondary | ICD-10-CM | POA: Diagnosis present

## 2016-06-23 DIAGNOSIS — Z01810 Encounter for preprocedural cardiovascular examination: Secondary | ICD-10-CM | POA: Diagnosis present

## 2016-06-23 DIAGNOSIS — C541 Malignant neoplasm of endometrium: Secondary | ICD-10-CM | POA: Diagnosis not present

## 2016-06-23 DIAGNOSIS — I517 Cardiomegaly: Secondary | ICD-10-CM | POA: Diagnosis not present

## 2016-06-23 DIAGNOSIS — I10 Essential (primary) hypertension: Secondary | ICD-10-CM | POA: Insufficient documentation

## 2016-06-23 DIAGNOSIS — R001 Bradycardia, unspecified: Secondary | ICD-10-CM | POA: Insufficient documentation

## 2016-06-23 DIAGNOSIS — Z01812 Encounter for preprocedural laboratory examination: Secondary | ICD-10-CM | POA: Insufficient documentation

## 2016-06-23 LAB — URINALYSIS, ROUTINE W REFLEX MICROSCOPIC
Bilirubin Urine: NEGATIVE
GLUCOSE, UA: NEGATIVE mg/dL
Hgb urine dipstick: NEGATIVE
Ketones, ur: NEGATIVE mg/dL
Nitrite: NEGATIVE
PH: 6 (ref 5.0–8.0)
Protein, ur: NEGATIVE mg/dL
SPECIFIC GRAVITY, URINE: 1.01 (ref 1.005–1.030)
SQUAMOUS EPITHELIAL / LPF: NONE SEEN

## 2016-06-23 LAB — CBC WITH DIFFERENTIAL/PLATELET
BASOS ABS: 0 10*3/uL (ref 0.0–0.1)
Basophils Relative: 0 %
EOS PCT: 1 %
Eosinophils Absolute: 0.1 10*3/uL (ref 0.0–0.7)
HEMATOCRIT: 39.1 % (ref 36.0–46.0)
Hemoglobin: 11.6 g/dL — ABNORMAL LOW (ref 12.0–15.0)
Lymphocytes Relative: 21 %
Lymphs Abs: 2.3 10*3/uL (ref 0.7–4.0)
MCH: 25.4 pg — ABNORMAL LOW (ref 26.0–34.0)
MCHC: 29.7 g/dL — AB (ref 30.0–36.0)
MCV: 85.6 fL (ref 78.0–100.0)
MONO ABS: 0.8 10*3/uL (ref 0.1–1.0)
MONOS PCT: 7 %
NEUTROS ABS: 7.6 10*3/uL (ref 1.7–7.7)
Neutrophils Relative %: 71 %
PLATELETS: 302 10*3/uL (ref 150–400)
RBC: 4.57 MIL/uL (ref 3.87–5.11)
RDW: 27.5 % — ABNORMAL HIGH (ref 11.5–15.5)
WBC: 10.8 10*3/uL — AB (ref 4.0–10.5)

## 2016-06-23 LAB — COMPREHENSIVE METABOLIC PANEL
ALT: 16 U/L (ref 14–54)
ANION GAP: 4 — AB (ref 5–15)
AST: 16 U/L (ref 15–41)
Albumin: 3.8 g/dL (ref 3.5–5.0)
Alkaline Phosphatase: 53 U/L (ref 38–126)
BILIRUBIN TOTAL: 0.2 mg/dL — AB (ref 0.3–1.2)
BUN: 11 mg/dL (ref 6–20)
CHLORIDE: 111 mmol/L (ref 101–111)
CO2: 27 mmol/L (ref 22–32)
Calcium: 9.4 mg/dL (ref 8.9–10.3)
Creatinine, Ser: 0.63 mg/dL (ref 0.44–1.00)
GFR calc Af Amer: >60 mL/min (ref >60)
Glucose, Bld: 104 mg/dL — ABNORMAL HIGH (ref 65–99)
Potassium: 5.6 mmol/L — ABNORMAL HIGH (ref 3.5–5.1)
Sodium: 142 mmol/L (ref 135–145)
TOTAL PROTEIN: 6.7 g/dL (ref 6.5–8.1)

## 2016-06-23 LAB — PREGNANCY, URINE: Preg Test, Ur: NEGATIVE

## 2016-06-23 NOTE — Progress Notes (Signed)
06-23-16 CMP and UA results routed to Dr. Denman George for review.

## 2016-06-24 LAB — ABO/RH: ABO/RH(D): B POS

## 2016-06-24 MED ORDER — DEXTROSE 5 % IV SOLN
3.0000 g | INTRAVENOUS | Status: AC
  Filled 2016-06-24: qty 3000

## 2016-06-24 NOTE — Progress Notes (Signed)
CXR routed via epic to [facility] MD

## 2016-06-25 ENCOUNTER — Ambulatory Visit (HOSPITAL_COMMUNITY)
Admission: RE | Admit: 2016-06-25 | Discharge: 2016-06-26 | Disposition: A | Discharge status: Home / Self Care | Payer: Managed Care, Other (non HMO) | Source: Ambulatory Visit | Attending: Gynecologic Oncology | Admitting: Gynecologic Oncology

## 2016-06-25 ENCOUNTER — Ambulatory Visit (HOSPITAL_COMMUNITY): Payer: Managed Care, Other (non HMO) | Admitting: Anesthesiology

## 2016-06-25 ENCOUNTER — Telehealth: Payer: Self-pay | Admitting: *Deleted

## 2016-06-25 ENCOUNTER — Encounter (HOSPITAL_COMMUNITY): Payer: Self-pay | Admitting: *Deleted

## 2016-06-25 ENCOUNTER — Encounter (HOSPITAL_COMMUNITY)
Admission: RE | Disposition: A | Discharge status: Home / Self Care | Payer: Self-pay | Source: Ambulatory Visit | Attending: Gynecologic Oncology

## 2016-06-25 DIAGNOSIS — Z803 Family history of malignant neoplasm of breast: Secondary | ICD-10-CM | POA: Diagnosis not present

## 2016-06-25 DIAGNOSIS — C541 Malignant neoplasm of endometrium: Secondary | ICD-10-CM | POA: Diagnosis present

## 2016-06-25 DIAGNOSIS — I10 Essential (primary) hypertension: Secondary | ICD-10-CM | POA: Insufficient documentation

## 2016-06-25 DIAGNOSIS — C775 Secondary and unspecified malignant neoplasm of intrapelvic lymph nodes: Secondary | ICD-10-CM | POA: Insufficient documentation

## 2016-06-25 DIAGNOSIS — N8502 Endometrial intraepithelial neoplasia [EIN]: Secondary | ICD-10-CM | POA: Diagnosis present

## 2016-06-25 DIAGNOSIS — D500 Iron deficiency anemia secondary to blood loss (chronic): Secondary | ICD-10-CM | POA: Diagnosis present

## 2016-06-25 DIAGNOSIS — F419 Anxiety disorder, unspecified: Secondary | ICD-10-CM | POA: Insufficient documentation

## 2016-06-25 DIAGNOSIS — Z79899 Other long term (current) drug therapy: Secondary | ICD-10-CM | POA: Insufficient documentation

## 2016-06-25 DIAGNOSIS — Z6841 Body Mass Index (BMI) 40.0 and over, adult: Secondary | ICD-10-CM | POA: Diagnosis not present

## 2016-06-25 HISTORY — PX: ROBOTIC ASSISTED TOTAL HYSTERECTOMY WITH BILATERAL SALPINGO OOPHERECTOMY: SHX6086

## 2016-06-25 HISTORY — PX: LYMPH NODE BIOPSY: SHX201

## 2016-06-25 LAB — TYPE AND SCREEN
ABO/RH(D): B POS
ANTIBODY SCREEN: NEGATIVE

## 2016-06-25 SURGERY — HYSTERECTOMY, TOTAL, ROBOT-ASSISTED, LAPAROSCOPIC, WITH BILATERAL SALPINGO-OOPHORECTOMY
Anesthesia: General | Site: Abdomen

## 2016-06-25 MED ORDER — STERILE WATER FOR IRRIGATION IR SOLN
Status: DC | PRN
  Administered 2016-06-25: 1000 mL

## 2016-06-25 MED ORDER — LABETALOL HCL 5 MG/ML IV SOLN
10.0000 mg | Freq: Once | INTRAVENOUS | Status: AC
  Administered 2016-06-25: 10 mg via INTRAVENOUS

## 2016-06-25 MED ORDER — ONDANSETRON HCL 4 MG/2ML IJ SOLN: 4.0000 mg | Freq: Four times a day (QID) | INTRAMUSCULAR | Status: DC | PRN

## 2016-06-25 MED ORDER — HYDROMORPHONE HCL 1 MG/ML IJ SOLN
0.2500 mg | INTRAMUSCULAR | Status: DC | PRN
  Administered 2016-06-25 (×4): 0.5 mg via INTRAVENOUS

## 2016-06-25 MED ORDER — HYDROMORPHONE HCL 1 MG/ML IJ SOLN
0.2000 mg | INTRAMUSCULAR | Status: DC | PRN
  Administered 2016-06-25: 0.5 mg via INTRAVENOUS
  Filled 2016-06-25: qty 1

## 2016-06-25 MED ORDER — PROMETHAZINE HCL 25 MG/ML IJ SOLN: 6.2500 mg | INTRAMUSCULAR | Status: DC | PRN

## 2016-06-25 MED ORDER — ROCURONIUM BROMIDE 50 MG/5ML IV SOSY
PREFILLED_SYRINGE | INTRAVENOUS | Status: AC
  Filled 2016-06-25: qty 10

## 2016-06-25 MED ORDER — FENTANYL CITRATE (PF) 100 MCG/2ML IJ SOLN
INTRAMUSCULAR | Status: AC
  Filled 2016-06-25: qty 2

## 2016-06-25 MED ORDER — ONDANSETRON HCL 4 MG/2ML IJ SOLN
INTRAMUSCULAR | Status: AC
  Filled 2016-06-25: qty 2

## 2016-06-25 MED ORDER — DEXAMETHASONE SODIUM PHOSPHATE 10 MG/ML IJ SOLN
INTRAMUSCULAR | Status: AC
  Filled 2016-06-25: qty 1

## 2016-06-25 MED ORDER — HYDROMORPHONE HCL 1 MG/ML IJ SOLN
INTRAMUSCULAR | Status: AC
  Filled 2016-06-25: qty 1

## 2016-06-25 MED ORDER — ESCITALOPRAM OXALATE 10 MG PO TABS
10.0000 mg | ORAL_TABLET | Freq: Every day | ORAL | Status: DC
  Administered 2016-06-26: 10 mg via ORAL
  Filled 2016-06-25: qty 1

## 2016-06-25 MED ORDER — ONDANSETRON HCL 4 MG/2ML IJ SOLN
INTRAMUSCULAR | Status: DC | PRN
  Administered 2016-06-25: 4 mg via INTRAVENOUS

## 2016-06-25 MED ORDER — LIDOCAINE 2% (20 MG/ML) 5 ML SYRINGE
INTRAMUSCULAR | Status: AC
  Filled 2016-06-25: qty 5

## 2016-06-25 MED ORDER — BUSPIRONE HCL 5 MG PO TABS
15.0000 mg | ORAL_TABLET | Freq: Every day | ORAL | Status: DC
  Administered 2016-06-26: 10:00:00 15 mg via ORAL
  Filled 2016-06-25: qty 1

## 2016-06-25 MED ORDER — DEXTROSE 5 % IV SOLN
3.0000 g | INTRAVENOUS | Status: AC
  Administered 2016-06-25: 3 g via INTRAVENOUS
  Filled 2016-06-25: qty 3

## 2016-06-25 MED ORDER — ENOXAPARIN SODIUM 40 MG/0.4ML ~~LOC~~ SOLN
40.0000 mg | SUBCUTANEOUS | Status: AC
  Administered 2016-06-25: 40 mg via SUBCUTANEOUS
  Filled 2016-06-25: qty 0.4

## 2016-06-25 MED ORDER — HYDROMORPHONE HCL 1 MG/ML IJ SOLN
INTRAMUSCULAR | Status: AC
  Filled 2016-06-25: qty 0.5

## 2016-06-25 MED ORDER — DICLOFENAC SODIUM 50 MG PO TBEC
50.0000 mg | DELAYED_RELEASE_TABLET | Freq: Three times a day (TID) | ORAL | Status: DC
  Administered 2016-06-25 – 2016-06-26 (×2): 50 mg via ORAL
  Filled 2016-06-25 (×3): qty 1

## 2016-06-25 MED ORDER — HYDRALAZINE HCL 20 MG/ML IJ SOLN
10.0000 mg | Freq: Once | INTRAMUSCULAR | Status: AC
  Administered 2016-06-25: 10 mg via INTRAVENOUS

## 2016-06-25 MED ORDER — FENTANYL CITRATE (PF) 250 MCG/5ML IJ SOLN
INTRAMUSCULAR | Status: AC
  Filled 2016-06-25: qty 5

## 2016-06-25 MED ORDER — FENTANYL CITRATE (PF) 250 MCG/5ML IJ SOLN
INTRAMUSCULAR | Status: DC | PRN
  Administered 2016-06-25 (×2): 50 ug via INTRAVENOUS
  Administered 2016-06-25: 150 ug via INTRAVENOUS

## 2016-06-25 MED ORDER — LIDOCAINE 2% (20 MG/ML) 5 ML SYRINGE
INTRAMUSCULAR | Status: DC | PRN
  Administered 2016-06-25: 100 mg via INTRAVENOUS

## 2016-06-25 MED ORDER — SUGAMMADEX SODIUM 500 MG/5ML IV SOLN
INTRAVENOUS | Status: AC
  Filled 2016-06-25: qty 5

## 2016-06-25 MED ORDER — SUCCINYLCHOLINE CHLORIDE 200 MG/10ML IV SOSY
PREFILLED_SYRINGE | INTRAVENOUS | Status: DC | PRN
  Administered 2016-06-25: 100 mg via INTRAVENOUS

## 2016-06-25 MED ORDER — ONDANSETRON HCL 4 MG PO TABS: 4.0000 mg | ORAL_TABLET | Freq: Four times a day (QID) | ORAL | Status: DC | PRN

## 2016-06-25 MED ORDER — SUCCINYLCHOLINE CHLORIDE 200 MG/10ML IV SOSY
PREFILLED_SYRINGE | INTRAVENOUS | Status: AC
  Filled 2016-06-25: qty 10

## 2016-06-25 MED ORDER — DEXAMETHASONE SODIUM PHOSPHATE 10 MG/ML IJ SOLN
INTRAMUSCULAR | Status: DC | PRN
  Administered 2016-06-25: 10 mg via INTRAVENOUS

## 2016-06-25 MED ORDER — MIDAZOLAM HCL 5 MG/5ML IJ SOLN
INTRAMUSCULAR | Status: DC | PRN
  Administered 2016-06-25: 2 mg via INTRAVENOUS

## 2016-06-25 MED ORDER — KCL IN DEXTROSE-NACL 20-5-0.45 MEQ/L-%-% IV SOLN
INTRAVENOUS | Status: DC
  Administered 2016-06-25: 21:00:00 via INTRAVENOUS
  Filled 2016-06-25: qty 1000

## 2016-06-25 MED ORDER — ALPRAZOLAM 0.25 MG PO TABS
0.2500 mg | ORAL_TABLET | Freq: Every day | ORAL | Status: DC | PRN
  Filled 2016-06-25: qty 1

## 2016-06-25 MED ORDER — ROCURONIUM BROMIDE 50 MG/5ML IV SOSY
PREFILLED_SYRINGE | INTRAVENOUS | Status: AC
  Filled 2016-06-25: qty 5

## 2016-06-25 MED ORDER — LABETALOL HCL 5 MG/ML IV SOLN
20.0000 mg | INTRAVENOUS | Status: DC | PRN
  Filled 2016-06-25: qty 4

## 2016-06-25 MED ORDER — OXYCODONE-ACETAMINOPHEN 5-325 MG PO TABS: 1.0000 | ORAL_TABLET | ORAL | Status: DC | PRN

## 2016-06-25 MED ORDER — PROPOFOL 10 MG/ML IV BOLUS
INTRAVENOUS | Status: DC | PRN
  Administered 2016-06-25: 50 mg via INTRAVENOUS
  Administered 2016-06-25: 150 mg via INTRAVENOUS
  Administered 2016-06-25: 100 mg via INTRAVENOUS

## 2016-06-25 MED ORDER — LACTATED RINGERS IR SOLN
Status: DC | PRN
  Administered 2016-06-25: 1000 mL

## 2016-06-25 MED ORDER — SCOPOLAMINE 1 MG/3DAYS TD PT72
1.0000 | MEDICATED_PATCH | TRANSDERMAL | Status: DC
  Administered 2016-06-25: 1.5 mg via TRANSDERMAL

## 2016-06-25 MED ORDER — HYDRALAZINE HCL 20 MG/ML IJ SOLN
INTRAMUSCULAR | Status: AC
  Filled 2016-06-25: qty 1

## 2016-06-25 MED ORDER — GABAPENTIN 300 MG PO CAPS
600.0000 mg | ORAL_CAPSULE | Freq: Every day | ORAL | Status: AC
  Administered 2016-06-25: 600 mg via ORAL
  Filled 2016-06-25: qty 2

## 2016-06-25 MED ORDER — LABETALOL HCL 5 MG/ML IV SOLN
INTRAVENOUS | Status: AC
  Filled 2016-06-25: qty 4

## 2016-06-25 MED ORDER — ENOXAPARIN SODIUM 40 MG/0.4ML ~~LOC~~ SOLN
40.0000 mg | SUBCUTANEOUS | Status: DC
  Administered 2016-06-26: 40 mg via SUBCUTANEOUS
  Filled 2016-06-25: qty 0.4

## 2016-06-25 MED ORDER — SUGAMMADEX SODIUM 200 MG/2ML IV SOLN
INTRAVENOUS | Status: DC | PRN
  Administered 2016-06-25: 500 mg via INTRAVENOUS

## 2016-06-25 MED ORDER — PROPOFOL 10 MG/ML IV BOLUS
INTRAVENOUS | Status: AC
Start: 2016-06-25 — End: 2016-06-25
  Filled 2016-06-25: qty 20

## 2016-06-25 MED ORDER — SCOPOLAMINE 1 MG/3DAYS TD PT72
MEDICATED_PATCH | TRANSDERMAL | Status: AC
  Filled 2016-06-25: qty 1

## 2016-06-25 MED ORDER — MEPERIDINE HCL 50 MG/ML IJ SOLN: 6.2500 mg | INTRAMUSCULAR | Status: DC | PRN

## 2016-06-25 MED ORDER — ROCURONIUM BROMIDE 10 MG/ML (PF) SYRINGE
PREFILLED_SYRINGE | INTRAVENOUS | Status: DC | PRN
  Administered 2016-06-25: 50 mg via INTRAVENOUS
  Administered 2016-06-25: 20 mg via INTRAVENOUS

## 2016-06-25 MED ORDER — MIDAZOLAM HCL 2 MG/2ML IJ SOLN
INTRAMUSCULAR | Status: AC
  Filled 2016-06-25: qty 2

## 2016-06-25 MED ORDER — LACTATED RINGERS IV SOLN
INTRAVENOUS | Status: DC
  Administered 2016-06-25 (×2): via INTRAVENOUS

## 2016-06-25 SURGICAL SUPPLY — 59 items
APPLICATOR SURGIFLO ENDO (HEMOSTASIS) IMPLANT
BAG LAPAROSCOPIC 12 15 PORT 16 (BASKET) IMPLANT
BAG RETRIEVAL 12/15 (BASKET)
CHLORAPREP W/TINT 26ML (MISCELLANEOUS) ×3 IMPLANT
COVER BACK TABLE 60X90IN (DRAPES) ×3 IMPLANT
COVER SURGICAL LIGHT HANDLE (MISCELLANEOUS) ×3 IMPLANT
COVER TIP SHEARS 8 DVNC (MISCELLANEOUS) ×2 IMPLANT
COVER TIP SHEARS 8MM DA VINCI (MISCELLANEOUS) ×1
DERMABOND ADVANCED (GAUZE/BANDAGES/DRESSINGS) ×1
DERMABOND ADVANCED .7 DNX12 (GAUZE/BANDAGES/DRESSINGS) ×2 IMPLANT
DRAPE ARM DVNC X/XI (DISPOSABLE) ×8 IMPLANT
DRAPE COLUMN DVNC XI (DISPOSABLE) ×2 IMPLANT
DRAPE DA VINCI XI ARM (DISPOSABLE) ×4
DRAPE DA VINCI XI COLUMN (DISPOSABLE) ×1
DRAPE SHEET LG 3/4 BI-LAMINATE (DRAPES) ×6 IMPLANT
DRAPE SURG IRRIG POUCH 19X23 (DRAPES) ×3 IMPLANT
ELECT REM PT RETURN 15FT ADLT (MISCELLANEOUS) ×3 IMPLANT
GLOVE BIO SURGEON STRL SZ 6 (GLOVE) ×12 IMPLANT
GLOVE BIO SURGEON STRL SZ 6.5 (GLOVE) ×6 IMPLANT
GOWN STRL REUS W/ TWL LRG LVL3 (GOWN DISPOSABLE) ×4 IMPLANT
GOWN STRL REUS W/TWL LRG LVL3 (GOWN DISPOSABLE) ×2
HOLDER FOLEY CATH W/STRAP (MISCELLANEOUS) ×3 IMPLANT
IRRIG SUCT STRYKERFLOW 2 WTIP (MISCELLANEOUS) ×3
IRRIGATION SUCT STRKRFLW 2 WTP (MISCELLANEOUS) ×2 IMPLANT
KIT BASIN OR (CUSTOM PROCEDURE TRAY) ×3 IMPLANT
KIT PROCEDURE DA VINCI SI (MISCELLANEOUS) ×1
KIT PROCEDURE DVNC SI (MISCELLANEOUS) ×2 IMPLANT
MANIPULATOR UTERINE 4.5 ZUMI (MISCELLANEOUS) ×3 IMPLANT
MARKER SKIN DUAL TIP RULER LAB (MISCELLANEOUS) ×3 IMPLANT
NDL SAFETY ECLIPSE 18X1.5 (NEEDLE) ×2 IMPLANT
NEEDLE HYPO 18GX1.5 SHARP (NEEDLE) ×1
NEEDLE SPNL 18GX3.5 QUINCKE PK (NEEDLE) ×3 IMPLANT
OBTURATOR OPTICAL STANDARD 8MM (TROCAR) ×1
OBTURATOR OPTICAL STND 8 DVNC (TROCAR) ×2
OBTURATOR OPTICALSTD 8 DVNC (TROCAR) ×2 IMPLANT
OCCLUDER COLPOPNEUMO (BALLOONS) ×3 IMPLANT
PAD POSITIONING PINK XL (MISCELLANEOUS) ×3 IMPLANT
PORT ACCESS TROCAR AIRSEAL 12 (TROCAR) ×2 IMPLANT
PORT ACCESS TROCAR AIRSEAL 5M (TROCAR) ×1
POUCH SPECIMEN RETRIEVAL 10MM (ENDOMECHANICALS) IMPLANT
SEAL CANN UNIV 5-8 DVNC XI (MISCELLANEOUS) ×8 IMPLANT
SEAL XI 5MM-8MM UNIVERSAL (MISCELLANEOUS) ×4
SET TRI-LUMEN FLTR TB AIRSEAL (TUBING) ×3 IMPLANT
SHEET LAVH (DRAPES) ×3 IMPLANT
SOLUTION ELECTROLUBE (MISCELLANEOUS) ×3 IMPLANT
SURGIFLO W/THROMBIN 8M KIT (HEMOSTASIS) IMPLANT
SUT MNCRL AB 4-0 PS2 18 (SUTURE) ×6 IMPLANT
SUT VIC AB 0 CT1 27 (SUTURE) ×1
SUT VIC AB 0 CT1 27XBRD ANTBC (SUTURE) ×2 IMPLANT
SYR 10ML LL (SYRINGE) ×3 IMPLANT
SYR 50ML LL SCALE MARK (SYRINGE) ×3 IMPLANT
TOWEL OR 17X26 10 PK STRL BLUE (TOWEL DISPOSABLE) ×6 IMPLANT
TOWEL OR NON WOVEN STRL DISP B (DISPOSABLE) ×3 IMPLANT
TRAP SPECIMEN MUCOUS 40CC (MISCELLANEOUS) IMPLANT
TRAY FOLEY W/METER SILVER 16FR (SET/KITS/TRAYS/PACK) ×3 IMPLANT
TRAY LAPAROSCOPIC (CUSTOM PROCEDURE TRAY) ×3 IMPLANT
TROCAR BLADELESS OPT 5 100 (ENDOMECHANICALS) ×3 IMPLANT
UNDERPAD 30X30 (UNDERPADS AND DIAPERS) ×3 IMPLANT
WATER STERILE IRR 1000ML POUR (IV SOLUTION) ×6 IMPLANT

## 2016-06-25 NOTE — Progress Notes (Signed)
Dr. Gifford Shave aware of blood pressures and heart rates- Labetalol 10 mg IVP given as ordered

## 2016-06-25 NOTE — Telephone Encounter (Signed)
Scheduled post op appt and patient will receive appt with discharge summary

## 2016-06-25 NOTE — Anesthesia Preprocedure Evaluation (Addendum)
Anesthesia Evaluation  Patient identified by MRN, date of birth, ID band Patient awake    Reviewed: Allergy & Precautions, NPO status , Patient's Chart, lab work & pertinent test results  Airway Mallampati: III  TM Distance: >3 FB Neck ROM: Full    Dental no notable dental hx. (+) Teeth Intact   Pulmonary neg pulmonary ROS,    Pulmonary exam normal breath sounds clear to auscultation       Cardiovascular hypertension, Normal cardiovascular exam Rhythm:Regular Rate:Normal     Neuro/Psych PSYCHIATRIC DISORDERS Anxiety negative neurological ROS     GI/Hepatic negative GI ROS, Neg liver ROS,   Endo/Other  Morbid obesity  Renal/GU negative Renal ROS  negative genitourinary   Musculoskeletal negative musculoskeletal ROS (+)   Abdominal (+) + obese,   Peds  Hematology  (+) Blood dyscrasia, anemia ,   Anesthesia Other Findings   Reproductive/Obstetrics Endometrial Ca                            Anesthesia Physical Anesthesia Plan  ASA: III  Anesthesia Plan: General   Post-op Pain Management:    Induction: Intravenous  PONV Risk Score and Plan: 4 or greater and Ondansetron, Dexamethasone, Propofol, Midazolam, Scopolamine patch - Pre-op and Treatment may vary due to age or medical condition  Airway Management Planned: Oral ETT  Additional Equipment:   Intra-op Plan:   Post-operative Plan: Extubation in OR  Informed Consent: I have reviewed the patients History and Physical, chart, labs and discussed the procedure including the risks, benefits and alternatives for the proposed anesthesia with the patient or authorized representative who has indicated his/her understanding and acceptance.   Dental advisory given  Plan Discussed with: CRNA, Anesthesiologist and Surgeon  Anesthesia Plan Comments:         Anesthesia Quick Evaluation

## 2016-06-25 NOTE — Anesthesia Procedure Notes (Signed)
Procedure Name: Intubation Date/Time: 06/25/2016 2:10 PM Performed by: Talbot Grumbling Pre-anesthesia Checklist: Patient identified, Emergency Drugs available, Suction available and Patient being monitored Patient Re-evaluated:Patient Re-evaluated prior to inductionOxygen Delivery Method: Circle system utilized Preoxygenation: Pre-oxygenation with 100% oxygen Intubation Type: IV induction Ventilation: Mask ventilation without difficulty Laryngoscope Size: Miller and 2 Grade View: Grade I Tube type: Oral Tube size: 7.5 mm Number of attempts: 1 Airway Equipment and Method: Stylet Placement Confirmation: ETT inserted through vocal cords under direct vision,  positive ETCO2 and breath sounds checked- equal and bilateral Secured at: 22 cm Tube secured with: Tape Dental Injury: Teeth and Oropharynx as per pre-operative assessment

## 2016-06-25 NOTE — Progress Notes (Signed)
Dr. Gifford Shave in- aware of patient's blood pressures and heart rates-Labetalol 10 mg IVP repeated as ordered.

## 2016-06-25 NOTE — H&P (View-Only) (Signed)
Consult Note: Gyn-Onc  Consult was requested by Dr. Ulanda Edison for the evaluation of Misty [name] 55 y.o. female  CC:  Chief Complaint  Patient presents with  . Endometrial Cancer    Assessment/Plan:  Misty [name]  is a 56 y.o.  year old with grade 2 endometrial cancer and morbid obesity (BMI 52).   A detailed discussion was held with the patient and her family with regard to to her endometrial cancer diagnosis. We discussed the standard management options for uterine cancer which includes surgery followed possibly by adjuvant therapy depending on the results of surgery. The options for surgical management include a hysterectomy and removal of the tubes and ovaries possibly with removal of pelvic and para-aortic lymph nodes.If feasible, a minimally invasive approach including a robotic hysterectomy or laparoscopic hysterectomy have benefits including shorter hospital stay, recovery time and better wound healing than with open surgery. The patient has been counseled about these surgical options and the risks of surgery in general including infection, bleeding, damage to surrounding structures (including bowel, bladder, ureters, nerves or vessels), and the postoperative risks of PE/ DVT, and lymphedema. I extensively reviewed the additional risks of robotic hysterectomy including possible need for conversion to open laparotomy.  I discussed positioning during surgery of trendelenberg and risks of minor facial swelling and care we take in preoperative positioning.  After counseling and consideration of her options, she desires to proceed with robotic assisted total hysterectomy with bilateral sapingo-oophorectomy and SLN biopsy. I discussed that risks are higher in patients who have extreme morbid obesity.  She will be seen by anesthesia for preoperative clearance and discussion of postoperative pain management.  She was given the opportunity to ask questions, which were answered to her  satisfaction, and she is agreement with the above mentioned plan of care.  She has chronic anemia from persistent blood loss. Recommend continuing iron replacement until the time of surgery. Now that her bleeding has improved she should have improved Hx prior to surgery.   HPI: Misty [name] is a 55 year old woman who is seen in consultation at the request of Dr Ulanda Edison for grade 2 endometrial cancer.  The patient reports abnormal uterine bleeding for approximately 6 years (2-4 bleeding episodes per year). She had a particularly heavy bleeding episode on 05/21/16 and was seen in the ED. Hb was 7.2g/dL. She was started on Megace. Her bleeding improved.  [place] on 05/29/16 showed a uterus with 9x6x5.7cm dimensions and an endometrial stripe of 51mm.  Endometrial pipelle biopsy on 06/05/16 showed a grade 2 endometrial cancer.  Current Meds:  Outpatient Encounter Prescriptions as of 06/17/2016  Medication Sig  . ALPRAZolam (XANAX) 0.5 MG tablet Take 0.25 mg by mouth daily as needed for anxiety.  . busPIRone (BUSPAR) 15 MG tablet Take 15 mg by mouth 2 (two) times daily.  Marland Kitchen escitalopram (LEXAPRO) 20 MG tablet Take 20 mg by mouth daily.  . IRON PO Take 65 mg by mouth daily.  . medroxyPROGESTERone (PROVERA) 10 MG tablet Take 10 mg by mouth daily.  . megestrol (MEGACE) 40 MG tablet Take 80 mg by mouth twice daily x 3 days, then 40 mg by mouth twice daily until seen on 05/27/2016  . [DISCONTINUED] citalopram (CELEXA) 40 MG tablet Take 20 mg by mouth 2 (two) times daily.   No facility-administered encounter medications on file as of 06/17/2016.     Allergy: No Known Allergies  Social Hx:   Social History   Social History  .  Marital status: Married    Spouse name: N/A  . Number of children: N/A  . Years of education: N/A   Occupational History  . Not on file.   Social History Main Topics  . Smoking status: Never Smoker  . Smokeless tobacco: Never Used  . Alcohol use No  . Drug use: No  .  Sexual activity: Not on file   Other Topics Concern  . Not on file   Social History Narrative  . No narrative on file    Past Surgical Hx: History reviewed. No pertinent surgical history.  Past Medical Hx:  Past Medical History:  Diagnosis Date  . Anxiety   . Hypertension     Past Gynecological History:  SVD x 2 No LMP recorded.  Family Hx: History reviewed. No pertinent family history.  Patient's oldest and youngest sister have history of premenopausal breast cancer (she is one of 8 siblings).  Review of Systems:  Constitutional  Feels well,    ENT Normal appearing ears and nares bilaterally Skin/Breast  No rash, sores, jaundice, itching, dryness Cardiovascular  No chest pain, shortness of breath, or edema  Pulmonary  No cough or wheeze.  Gastro Intestinal  No nausea, vomitting, or diarrhoea. No bright red blood per rectum, no abdominal pain, change in bowel movement, or constipation.  Genito Urinary  No frequency, urgency, dysuria, + heavy vaginal bleeding. Musculo Skeletal  No myalgia, arthralgia, joint swelling or pain  Neurologic  No weakness, numbness, change in gait,  Psychology  No depression, anxiety, insomnia.   labratory assessment: CBC    Component Value Date/Time   WBC 10.3 05/19/2016 2017   RBC 3.61 (L) 05/19/2016 2017   HGB 7.2 (L) 05/19/2016 2017   HGB 14.4 08/28/2009 1342   HCT 24.6 (L) 05/19/2016 2017   HCT 43.3 08/28/2009 1342   PLT 331 05/19/2016 2017   PLT 278 08/28/2009 1342   MCV 68.1 (L) 05/19/2016 2017   MCV 86.9 08/28/2009 1342   MCH 19.9 (L) 05/19/2016 2017   MCHC 29.3 (L) 05/19/2016 2017   RDW 21.1 (H) 05/19/2016 2017   RDW 17.2 (H) 08/28/2009 1342   LYMPHSABS 1.7 09/12/2013 0940   LYMPHSABS 1.9 08/28/2009 1342   MONOABS 0.9 09/12/2013 0940   MONOABS 1.1 (H) 08/28/2009 1342   EOSABS 0.1 09/12/2013 0940   EOSABS 0.1 08/28/2009 1342   BASOSABS 0.0 09/12/2013 0940   BASOSABS 0.0 08/28/2009 1342    Vitals:  Blood  pressure (!) 155/85, pulse 66, temperature 98.5 F (36.9 C), resp. rate 20, height 5' 4.06" (1.627 m), weight 298 lb 4.8 oz (135.3 kg).  Physical Exam: WD in NAD Neck  Supple NROM, without any enlargements.  Lymph Node Survey No cervical supraclavicular or inguinal adenopathy Cardiovascular  Pulse normal rate, regularity and rhythm. S1 and S2 normal.  Lungs  Clear to auscultation bilateraly, without wheezes/crackles/rhonchi. Good air movement.  Skin  No rash/lesions/breakdown  Psychiatry  Alert and oriented to person, place, and time  Abdomen  Normoactive bowel sounds, abdomen soft, non-tender and obese with pannus without vidence of hernia.  Back No CVA tenderness Genito Urinary  Vulva/vagina: Normal external female genitalia.  No lesions. No discharge or bleeding.  Bladder/urethra:  No lesions or masses, well supported bladder  Vagina: normal, some prolaps  Cervix: Normal appearing, no lesions.  Uterus:  Small, mobile, no parametrial involvement or nodularity.  Adnexa: no discretely palpable masses. Rectal  deferred Extremities  No bilateral cyanosis, clubbing or edema.   Everitt Amber  Chrys Racer, MD  06/17/2016, 12:26 PM

## 2016-06-25 NOTE — Transfer of Care (Signed)
Immediate Anesthesia Transfer of Care Note  Patient: Misty [name]  Procedure(s) Performed: Procedure(s): XI ROBOTIC ASSISTED TOTAL HYSTERECTOMY WITH BILATERAL SALPINGO OOPHORECTOMY for uterus greater 250 grams  (Bilateral) LYMPH NODE BIOPSY (N/A)  Patient Location: PACU  Anesthesia Type:General  Level of Consciousness:  sedated, patient cooperative and responds to stimulation  Airway & Oxygen Therapy:Patient Spontanous Breathing and Patient connected to face mask oxgen  Post-op Assessment:  Report given to PACU RN and Post -op Vital signs reviewed and stable  Post vital signs:  Reviewed and stable  Last Vitals:  Vitals:   06/25/16 1228  BP: (!) 150/90  Pulse: 77  Resp: 18  Temp: 13.6 C    Complications: No apparent anesthesia complications

## 2016-06-25 NOTE — Progress Notes (Signed)
Dr. Gifford Shave in-made aware of patient's blood pressure and heart rate

## 2016-06-25 NOTE — Progress Notes (Signed)
Dr. Gifford Shave made aware of patient's blood pressures and heart rates-medication patient received-Apresoline 10 mg IVP given as ordered.

## 2016-06-25 NOTE — Anesthesia Postprocedure Evaluation (Signed)
Anesthesia Post Note  Patient: Misty [name]  Procedure(s) Performed: Procedure(s) (LRB): XI ROBOTIC ASSISTED TOTAL HYSTERECTOMY WITH BILATERAL SALPINGO OOPHORECTOMY for uterus greater 250 grams  (Bilateral) LYMPH NODE BIOPSY (N/A)     Patient location during evaluation: PACU Anesthesia Type: General Level of consciousness: awake and alert Pain management: pain level controlled Vital Signs Assessment: post-procedure vital signs reviewed and stable Respiratory status: spontaneous breathing, nonlabored ventilation, respiratory function stable and patient connected to nasal cannula oxygen Cardiovascular status: blood pressure returned to baseline and stable Postop Assessment: no signs of nausea or vomiting Anesthetic complications: no    Last Vitals:  Vitals:   06/25/16 1723 06/25/16 1725  BP: (!) 164/95 (!) 157/97  Pulse: 72 75  Resp: 18 15  Temp:      Last Pain:  Vitals:   06/25/16 1720  TempSrc:   PainSc: [place]

## 2016-06-25 NOTE — Op Note (Signed)
OPERATIVE NOTE 06/25/16  Surgeon: Donaciano Eva   Assistants: Dr Lahoma Crocker (an MD assistant was necessary for tissue manipulation, management of robotic instrumentation, retraction and positioning due to the complexity of the case and hospital policies).   Anesthesia: General endotracheal anesthesia  ASA Class: 3   Pre-operative Diagnosis: endometrial cancer grade 2.  Post-operative Diagnosis: same  Operation: Robotic-assisted laparoscopic total hysterectomy uterus >250gm with bilateral salpingoophorectomy, SLN biopsy  Surgeon: Donaciano Eva  Assistant Surgeon: Lahoma Crocker MD  Anesthesia: GET  Urine Output: 300cc  Operative Findings:  : 12cm uterus, normal tubes and ovaries. Large volume visceral adiposity limiting exposure and visualization and increasing the duration of the procedure.   Estimated Blood Loss:  less than 50 mL      Total IV Fluids: 900 ml         Specimens: uterus, tubes and ovaries, right external iliac SLN, right obturator SLN.         Complications:  None; patient tolerated the procedure well.         Disposition: PACU - hemodynamically stable.  Procedure Details  The patient was seen in the Holding Room. The risks, benefits, complications, treatment options, and expected outcomes were discussed with the patient.  The patient concurred with the proposed plan, giving informed consent.  The site of surgery properly noted/marked. The patient was identified as Misty [name], Misty [name] and the procedure verified as a Robotic-assisted hysterectomy with bilateral salpingo oophorectomy. A Time Out was held and the above information confirmed.  After induction of anesthesia, the patient was draped and prepped in the usual sterile manner. Pt was placed in supine position after anesthesia and draped and prepped in the usual sterile manner. The abdominal drape was placed after the CholoraPrep had been allowed to dry for 3 minutes.  Her arms were  tucked to her side with all appropriate precautions.  The shoulders were stabilized with padded shoulder blocks applied to the acromium processes.  The patient was placed in the semi-lithotomy position in [place].  The perineum was prepped with Betadine. The patient was then prepped. Foley catheter was placed.  A sterile speculum was placed in the vagina.  The cervix was grasped with a single-tooth tenaculum and dilated with Kennon Rounds dilators. 1mg  total of ICG was injected into the cervical stroma at 2 and 9 o'clock at a 90mm depth (concentration 0..5mg /ml).  The ZUMI uterine manipulator with a medium colpotomizer ring was placed without difficulty.  A pneum occluder balloon was placed over the manipulator.  OG tube placement was confirmed and to suction.   Next, a 5 mm skin incision was made 1 cm below the subcostal margin in the midclavicular line.  The 5 mm Optiview port and scope was used for direct entry.  Opening pressure was under 10 mm CO2.  The abdomen was insufflated and the findings were noted as above.   At this point and all points during the procedure, the patient's intra-abdominal pressure did not exceed 15 mmHg. Next, a 10 mm skin incision was made 2cm above the umbilicus and a right and left port was placed about 10 cm lateral to the robot port on the right and left side.  A fourth arm was placed in the left lower quadrant 2 cm above and superior and medial to the anterior superior iliac spine.  All ports were placed under direct visualization.  The patient was placed in steep Trendelenburg.  Bowel was folded away into the upper abdomen.  The  robot was docked in the normal manner.  The right and left peritoneum were opened parallel to the IP ligament to open the retroperitoneal spaces bilaterally. The SLN mapping was performed in bilateral pelvic basins. The para rectal and paravesical spaces were opened up. Lymphatic channels were identified travelling to the following visualized sentinel  lymph node's: right external ilac SLN right obturator SLN. These SLN's were separated from their surrounding lymphatic tissue, removed and sent for permanent pathology. There was failed mapping on the left, however due to extreme obesity and retroperitoneal fat, it was not technically possible for a completion lymphadenectomy.  The hysterectomy was started after the round ligament on the right side was incised and the retroperitoneum was entered and the pararectal space was developed.  The ureter was noted to be on the medial leaf of the broad ligament.  The peritoneum above the ureter was incised and stretched and the infundibulopelvic ligament was skeletonized, cauterized and cut.  The posterior peritoneum was taken down to the level of the KOH ring.  The anterior peritoneum was also taken down.  The bladder flap was created to the level of the KOH ring.  The uterine artery on the right side was skeletonized, cauterized and cut in the normal manner.  A similar procedure was performed on the left.  The colpotomy was made and the uterus, cervix, bilateral ovaries and tubes were amputated and delivered through the vagina.  Pedicles were inspected and excellent hemostasis was achieved.    The colpotomy at the vaginal cuff was closed with Vicryl on a CT1 needle in a running manner.  Irrigation was used and excellent hemostasis was achieved.  At this point in the procedure was completed.  Robotic instruments were removed under direct visulaization.  The robot was undocked. The 10 mm ports were closed with Vicryl on a UR-5 needle and the fascia was closed with 0 Vicryl on a UR-5 needle.  The skin was closed with 4-0 Vicryl in a subcuticular manner.  Dermabond was applied.  Sponge, lap and needle counts correct x 2.  The patient was taken to the recovery room in stable condition.  The vagina was swabbed with  minimal bleeding noted.   All instrument and needle counts were correct x  3.   The patient was  transferred to the recovery room in a stable condition.  Donaciano Eva, MD

## 2016-06-25 NOTE — Interval H&P Note (Signed)
History and Physical Interval Note:  06/25/2016 1:45 PM  Misty [name]  has presented today for surgery, with the diagnosis of ENDOMETRIAL CANCER  The various methods of treatment have been discussed with the patient and family. After consideration of risks, benefits and other options for treatment, the patient has consented to  Procedure(s): XI ROBOTIC ASSISTED TOTAL HYSTERECTOMY WITH BILATERAL SALPINGO OOPHORECTOMY (Bilateral) LYMPH NODE BIOPSY (N/A) as a surgical intervention .  The patient's history has been reviewed, patient examined, no change in status, stable for surgery.  I have reviewed the patient's chart and labs.  Questions were answered to the patient's satisfaction.     Donaciano Eva

## 2016-06-26 ENCOUNTER — Encounter (HOSPITAL_COMMUNITY): Payer: Self-pay | Admitting: Gynecologic Oncology

## 2016-06-26 DIAGNOSIS — C541 Malignant neoplasm of endometrium: Secondary | ICD-10-CM | POA: Diagnosis not present

## 2016-06-26 LAB — CBC
HCT: 36.2 % (ref 36.0–46.0)
HEMOGLOBIN: 10.8 g/dL — AB (ref 12.0–15.0)
MCH: 25.9 pg — AB (ref 26.0–34.0)
MCHC: 29.8 g/dL — AB (ref 30.0–36.0)
MCV: 86.8 fL (ref 78.0–100.0)
PLATELETS: 285 10*3/uL (ref 150–400)
RBC: 4.17 MIL/uL (ref 3.87–5.11)
RDW: 27 % — ABNORMAL HIGH (ref 11.5–15.5)
WBC: 10.6 10*3/uL — ABNORMAL HIGH (ref 4.0–10.5)

## 2016-06-26 LAB — BASIC METABOLIC PANEL
Anion gap: 7 (ref 5–15)
BUN: 10 mg/dL (ref 6–20)
CALCIUM: 8.5 mg/dL — AB (ref 8.9–10.3)
CHLORIDE: 105 mmol/L (ref 101–111)
CO2: 28 mmol/L (ref 22–32)
CREATININE: 0.73 mg/dL (ref 0.44–1.00)
GFR calc Af Amer: >60 mL/min (ref >60)
GLUCOSE: 146 mg/dL — AB (ref 65–99)
POTASSIUM: 4.8 mmol/L (ref 3.5–5.1)
Sodium: 140 mmol/L (ref 135–145)

## 2016-06-26 MED ORDER — OXYCODONE-ACETAMINOPHEN 5-325 MG PO TABS: 1.0000 | ORAL_TABLET | ORAL | 0 refills | Status: DC | PRN

## 2016-06-26 NOTE — Progress Notes (Signed)
Patient Discharged Home. Discharge instructions  provided to the patient which included instructions that addressed activity level, diet, discharge medications, follow-up appointments, weight monitoring and what to do if symptoms worsen. All patients Questions answered.   

## 2016-06-26 NOTE — Discharge Instructions (Addendum)
06/25/2016  Return to work: 4 weeks  Activity: 1. Be up and out of the bed during the day.  Take a nap if needed.  You may walk up steps but be careful and use the hand rail.  Stair climbing will tire you more than you think, you may need to stop part way and rest.   2. No lifting or straining for 6 weeks.  3. No driving for 2 weeks.  Do Not drive if you are taking narcotic pain medicine.  4. Shower daily.  Use soap and water on your incision and pat dry; don't rub.   5. No sexual activity and nothing in the vagina for 8 weeks.  Medications:  - Take ibuprofen and tylenol first line for pain control. Take these regularly (every 6 hours) to decrease the build up of pain.  - If necessary, for severe pain not relieved by ibuprofen, take percocet.  - While taking percocet you should take sennakot every night to reduce the likelihood of constipation. If this causes diarrhea, stop its use.  Diet: 1. Low sodium Heart Healthy Diet is recommended.  2. It is safe to use a laxative if you have difficulty moving your bowels.   Wound Care: 1. Keep clean and dry.  Shower daily.  Reasons to call the Doctor:   Fever - Oral temperature greater than 100.4 degrees Fahrenheit  Foul-smelling vaginal discharge  Difficulty urinating  Nausea and vomiting  Increased pain at the site of the incision that is unrelieved with pain medicine.  Difficulty breathing with or without chest pain  New calf pain especially if only on one side  Sudden, continuing increased vaginal bleeding with or without clots.   Follow-up: 1. See Everitt Amber in 3 weeks.  Contacts: For questions or concerns you should contact:  Dr. Everitt Amber at [phone] After hours and on week-ends call [phone] and ask to speak to the physician on call for Gynecologic Oncology   Acetaminophen; Oxycodone tablets What is this medicine? ACETAMINOPHEN; OXYCODONE (a set a MEE noe fen; ox i KOE done) is a pain reliever. It is  used to treat moderate to severe pain. This medicine may be used for other purposes; ask your health care provider or pharmacist if you have questions. COMMON BRAND NAME(S): Endocet, Magnacet, Narvox, Percocet, Perloxx, Primalev, Primlev, Roxicet, Xolox What should I tell my health care provider before I take this medicine? They need to know if you have any of these conditions: -brain tumor -Crohn's disease, inflammatory bowel disease, or ulcerative colitis -drug abuse or addiction -head injury -heart or circulation problems -if you often drink alcohol -kidney disease or problems going to the bathroom -liver disease -lung disease, asthma, or breathing problems -an unusual or allergic reaction to acetaminophen, oxycodone, other opioid analgesics, other medicines, foods, dyes, or preservatives -pregnant or trying to get pregnant -breast-feeding How should I use this medicine? Take this medicine by mouth with a full glass of water. Follow the directions on the prescription label. You can take it with or without food. If it upsets your stomach, take it with food. Take your medicine at regular intervals. Do not take it more often than directed. A special MedGuide will be given to you by the pharmacist with each prescription and refill. Be sure to read this information carefully each time. Talk to your pediatrician regarding the use of this medicine in children. Special care may be needed. Overdosage: If you think you have taken too much of this medicine  contact a poison control center or emergency room at once. NOTE: This medicine is only for you. Do not share this medicine with others. What if I miss a dose? If you miss a dose, take it as soon as you can. If it is almost time for your next dose, take only that dose. Do not take double or extra doses. What may interact with this medicine? This medicine may interact with the following medications: -alcohol -antihistamines for allergy, cough and  cold -antiviral medicines for HIV or AIDS -atropine -certain antibiotics like clarithromycin, erythromycin, linezolid, rifampin -certain medicines for anxiety or sleep -certain medicines for bladder problems like oxybutynin, tolterodine -certain medicines for depression like amitriptyline, fluoxetine, sertraline -certain medicines for fungal infections like ketoconazole, itraconazole, voriconazole -certain medicines for migraine headache like almotriptan, eletriptan, frovatriptan, naratriptan, rizatriptan, sumatriptan, zolmitriptan -certain medicines for nausea or vomiting like dolasetron, ondansetron, palonosetron -certain medicines for Parkinson's disease like benztropine, trihexyphenidyl -certain medicines for seizures like phenobarbital, phenytoin, primidone -certain medicines for stomach problems like dicyclomine, hyoscyamine -certain medicines for travel sickness like scopolamine -diuretics -general anesthetics like halothane, isoflurane, methoxyflurane, propofol -ipratropium -local anesthetics like lidocaine, pramoxine, tetracaine -MAOIs like Carbex, Eldepryl, Marplan, Nardil, and Parnate -medicines that relax muscles for surgery -methylene blue -nilotinib -other medicines with acetaminophen -other narcotic medicines for pain or cough -phenothiazines like chlorpromazine, mesoridazine, prochlorperazine, thioridazine This list may not describe all possible interactions. Give your health care provider a list of all the medicines, herbs, non-prescription drugs, or dietary supplements you use. Also tell them if you smoke, drink alcohol, or use illegal drugs. Some items may interact with your medicine. What should I watch for while using this medicine? Tell your doctor or health care professional if your pain does not go away, if it gets worse, or if you have new or a different type of pain. You may develop tolerance to the medicine. Tolerance means that you will need a higher dose of the  medication for pain relief. Tolerance is normal and is expected if you take this medicine for a long time. Do not suddenly stop taking your medicine because you may develop a severe reaction. Your body becomes used to the medicine. This does NOT mean you are addicted. Addiction is a behavior related to getting and using a drug for a non-medical reason. If you have pain, you have a medical reason to take pain medicine. Your doctor will tell you how much medicine to take. If your doctor wants you to stop the medicine, the dose will be slowly lowered over time to avoid any side effects. There are different types of narcotic medicines (opiates). If you take more than one type at the same time or if you are taking another medicine that also causes drowsiness, you may have more side effects. Give your health care provider a list of all medicines you use. Your doctor will tell you how much medicine to take. Do not take more medicine than directed. Call emergency for help if you have problems breathing or unusual sleepiness. Do not take other medicines that contain acetaminophen with this medicine. Always read labels carefully. If you have questions, ask your doctor or pharmacist. If you take too much acetaminophen get medical help right away. Too much acetaminophen can be very dangerous and cause liver damage. Even if you do not have symptoms, it is important to get help right away. You may get drowsy or dizzy. Do not drive, use machinery, or do anything that needs mental alertness until you  know how this medicine affects you. Do not stand or sit up quickly, especially if you are an older patient. This reduces the risk of dizzy or fainting spells. Alcohol may interfere with the effect of this medicine. Avoid alcoholic drinks. The medicine will cause constipation. Try to have a bowel movement at least every 2 to 3 days. If you do not have a bowel movement for 3 days, call your doctor or health care professional. Your  mouth may get dry. Chewing sugarless gum or sucking hard candy, and drinking plenty or water may help. Contact your doctor if the problem does not go away or is severe. What side effects may I notice from receiving this medicine? Side effects that you should report to your doctor or health care professional as soon as possible: -allergic reactions like skin rash, itching or hives, swelling of the face, lips, or tongue -breathing problems -confusion -redness, blistering, peeling or loosening of the skin, including inside the mouth -signs and symptoms of liver injury like dark yellow or brown urine; general ill feeling or flu-like symptoms; light-colored stools; loss of appetite; nausea; right upper belly pain; unusually weak or tired; yellowing of the eyes or skin -signs and symptoms of low blood pressure like dizziness; feeling faint or lightheaded, falls; unusually weak or tired -trouble passing urine or change in the amount of urine Side effects that usually do not require medical attention (report to your doctor or health care professional if they continue or are bothersome): -constipation -dry mouth -nausea, vomiting -tiredness This list may not describe all possible side effects. Call your doctor for medical advice about side effects. You may report side effects to FDA at 1-800-FDA-1088. Where should I keep my medicine? Keep out of the reach of children. This medicine can be abused. Keep your medicine in a safe place to protect it from theft. Do not share this medicine with anyone. Selling or giving away this medicine is dangerous and against the law. This medicine may cause accidental overdose and death if it taken by other adults, children, or pets. Mix any unused medicine with a substance like cat litter or coffee grounds. Then throw the medicine away in a sealed container like a sealed bag or a coffee can with a lid. Do not use the medicine after the expiration date. Store at room  temperature between 20 and 25 degrees C (68 and 77 degrees F). NOTE: This sheet is a summary. It may not cover all possible information. If you have questions about this medicine, talk to your doctor, pharmacist, or health care provider.  2018 Elsevier/Gold Standard (2014-09-24 21:48:12)

## 2016-06-26 NOTE — Discharge Summary (Signed)
Physician Discharge Summary  Patient ID: Misty [name] MRN: [id number] DOB/AGE: 1960-01-23 56 y.o.  Admit date: 06/25/2016 Discharge date: 06/26/2016  Admission Diagnoses: Endometrial cancer [facility])  Discharge Diagnoses:  Principal Problem:   Endometrial cancer ([place]) Active Problems:   Iron deficiency anemia due to chronic blood loss   Morbid obesity with BMI of 50.0-59.9, adult ([place])   Complex atypical endometrial hyperplasia   Discharged Condition:  The patient is in good condition and stable for discharge.    Hospital Course: On 06/25/2016, the patient underwent the following: Procedure(s): XI ROBOTIC ASSISTED TOTAL HYSTERECTOMY WITH BILATERAL SALPINGO OOPHORECTOMY for uterus greater 250 grams  LYMPH NODE BIOPSY.   The postoperative course was uneventful.  She was discharged to home on postoperative day 1 tolerating a regular diet, passing flatus, ambulating, voiding.  Consults: None  Significant Diagnostic Studies: None  Treatments: surgery: None  Discharge Exam: Blood pressure 127/69, pulse (!) 51, temperature 98.2 F (36.8 C), temperature source Oral, resp. rate 18, height 5\' 4"  (1.626 m), weight 300 lb (136.1 kg), last menstrual period 05/31/2016, SpO2 100 %. General appearance: alert, cooperative, appears stated age and no distress Resp: clear to auscultation bilaterally Cardio: regular rate and rhythm, S1, S2 normal, no murmur, click, rub or gallop GI: soft, non-tender; bowel sounds normal; no masses,  no organomegaly Extremities: extremities normal, atraumatic, no cyanosis or edema Incision/Wound: Lap sites to the abdomen without erythema or drainage  Disposition: 01-Home or Self Care  Discharge Instructions    Call MD for:  difficulty breathing, headache or visual disturbances    Complete by:  As directed    Call MD for:  extreme fatigue    Complete by:  As directed    Call MD for:  hives    Complete by:  As directed    Call MD for:  persistant dizziness or  light-headedness    Complete by:  As directed    Call MD for:  persistant nausea and vomiting    Complete by:  As directed    Call MD for:  redness, tenderness, or signs of infection (pain, swelling, redness, odor or green/yellow discharge around incision site)    Complete by:  As directed    Call MD for:  severe uncontrolled pain    Complete by:  As directed    Call MD for:  temperature >100.4    Complete by:  As directed    Diet - low sodium heart healthy    Complete by:  As directed    Driving Restrictions    Complete by:  As directed    No driving for 1 week.  Do not take narcotics and drive.   Increase activity slowly    Complete by:  As directed    Lifting restrictions    Complete by:  As directed    No lifting greater than 10 lbs.   Sexual Activity Restrictions    Complete by:  As directed    No sexual activity, nothing in the vagina, for 8 weeks.     Allergies as of 06/26/2016   No Known Allergies     Medication List    STOP taking these medications   medroxyPROGESTERone 10 MG tablet Commonly known as:  PROVERA   megestrol 40 MG tablet Commonly known as:  MEGACE     TAKE these medications   ALPRAZolam 0.5 MG tablet Commonly known as:  XANAX Take 0.25-0.5 mg by mouth daily as needed for anxiety (FOR PANIC/ANXIETY).   busPIRone  15 MG tablet Commonly known as:  BUSPAR Take 15 mg by mouth daily.   escitalopram 20 MG tablet Commonly known as:  LEXAPRO Take 10 mg by mouth daily.   ferrous sulfate 325 (65 FE) MG tablet Take 325 mg by mouth 2 (two) times daily.   oxyCODONE-acetaminophen 5-325 MG tablet Commonly known as:  PERCOCET/ROXICET Take 1-2 tablets by mouth every 4 (four) hours as needed (moderate to severe pain).      Follow-up Information    Everitt Amber, MD In 3 weeks.   Specialty:  Obstetrics and Gynecology Contact information: [address] [place] [address] [phone]           Greater than thirty minutes were spend for  face to face discharge instructions and discharge orders/summary in EPIC.   Signed: Terrence Pizana DEAL 06/26/2016, 10:40 AM

## 2016-07-02 ENCOUNTER — Telehealth: Payer: Self-pay | Admitting: *Deleted

## 2016-07-02 ENCOUNTER — Telehealth: Payer: Self-pay | Admitting: Gynecologic Oncology

## 2016-07-02 DIAGNOSIS — C541 Malignant neoplasm of endometrium: Secondary | ICD-10-CM

## 2016-07-02 DIAGNOSIS — C775 Secondary and unspecified malignant neoplasm of intrapelvic lymph nodes: Secondary | ICD-10-CM | POA: Insufficient documentation

## 2016-07-02 NOTE — Telephone Encounter (Signed)
Informed patient of results of stage IIIC endometrial cancer. Recommendation is for adjuvant carb/tax x 6 cycles and intercollated vaginal brachytherapy.  I will order a CT abd/pelvis/chest pre-treatment for staging purposes.  All questions answered.

## 2016-07-02 NOTE — Telephone Encounter (Deleted)
Per staff message communication patient is set up for both the rad onc and med onc referrals. Med onc referral scheduled for July 11th at 2pm with Dr. Alvy Bimler and the rad onc with Dr. Sondra Come on July 11th at 10:30am. The CT scan also scheduled for July 9th at 10:30am. Patient will receive appts at the appt with Dr. Denman George July 6th, when the interpreter.

## 2016-07-02 NOTE — Telephone Encounter (Signed)
Per staff message communication the following appts have been scheduled. Med onc on July 1th at 1pm with Dr. Alvy Bimler and rad onc on July 1th at 9:30am with Dr. Sondra Come. Scheduled CT scan for June 27th at 9:30am at [facility]  Patient given intstructions of nothing to eat/drink after 5am; to drink the first bottle at 7:30am and the second bottle at 8:30am. Patient verbalized understanding and read the instructions/appts back to me.

## 2016-07-03 ENCOUNTER — Encounter: Payer: Self-pay | Admitting: Radiation Oncology

## 2016-07-07 ENCOUNTER — Encounter: Payer: Self-pay | Admitting: Gynecologic Oncology

## 2016-07-07 NOTE — Progress Notes (Signed)
Peer to peer performed for CT chest.  Scan approved.  Auth# [id number].  Aviva Signs in Authorization department notified.

## 2016-07-08 ENCOUNTER — Encounter (HOSPITAL_COMMUNITY): Payer: Self-pay

## 2016-07-08 ENCOUNTER — Ambulatory Visit (HOSPITAL_COMMUNITY)
Admission: RE | Admit: 2016-07-08 | Discharge: 2016-07-08 | Disposition: A | Discharge status: Home / Self Care | Payer: Managed Care, Other (non HMO) | Source: Ambulatory Visit | Attending: Gynecologic Oncology | Admitting: Gynecologic Oncology

## 2016-07-08 DIAGNOSIS — I708 Atherosclerosis of other arteries: Secondary | ICD-10-CM | POA: Insufficient documentation

## 2016-07-08 DIAGNOSIS — K449 Diaphragmatic hernia without obstruction or gangrene: Secondary | ICD-10-CM | POA: Insufficient documentation

## 2016-07-08 DIAGNOSIS — C541 Malignant neoplasm of endometrium: Secondary | ICD-10-CM | POA: Diagnosis not present

## 2016-07-08 DIAGNOSIS — C775 Secondary and unspecified malignant neoplasm of intrapelvic lymph nodes: Secondary | ICD-10-CM | POA: Diagnosis not present

## 2016-07-08 DIAGNOSIS — Z90710 Acquired absence of both cervix and uterus: Secondary | ICD-10-CM | POA: Insufficient documentation

## 2016-07-08 DIAGNOSIS — I700 Atherosclerosis of aorta: Secondary | ICD-10-CM | POA: Diagnosis not present

## 2016-07-08 MED ORDER — IOPAMIDOL (ISOVUE-300) INJECTION 61%
INTRAVENOUS | Status: AC
  Administered 2016-07-08: 100 mL
  Filled 2016-07-08: qty 100

## 2016-07-09 ENCOUNTER — Encounter: Payer: Self-pay | Admitting: Radiation Oncology

## 2016-07-09 NOTE — Progress Notes (Signed)
GYN Location of Tumor / Histology: stage IIIC endometrial cancer.  Misty [name] presented with symptoms of: abnormal uterine bleeding for approximately 6 years (2-4 bleeding episodes per year).   Biopsies revealed:   06/25/16 Diagnosis 1. Lymph node, sentinel, biopsy, right external iliac METASTATIC ADENOCARCINOMA IN ONE OF ONE LYMPH NODE (1/1) 2. Lymph node, sentinel, biopsy, right obturator METASTATIC ADENOCARCINOMA IN FOUR OF FOUR LYMPH NODES (4/4) 3. Uterus +/- tubes/ovaries, neoplastic, cervix ENDOMETRIOID ADENOCARCINOMA, FIGO GRADE 3, THE TUMOR INVADES OUTER HALF OF THE MYOMETRIUM (PT1B) ALL MARGINS OF RESECTION ARE NEGATIVE FOR TUMOR BENIGN BILATERAL OVARIES AND FALLOPIAN TUBES  Past/Anticipated interventions by Gyn/Onc surgery, if any: 06/25/16 - Procedure: XI ROBOTIC ASSISTED TOTAL HYSTERECTOMY WITH BILATERAL SALPINGO OOPHORECTOMY for uterus greater 250 grams ;  Surgeon: Everitt Amber, MD  Past/Anticipated interventions by medical oncology, if any: apt with Dr. Alvy Bimler on 07/22/16.  Weight changes, if any: no  Bowel/Bladder complaints, if any: reports that her bowels are moving slower since surgery.  She denies having any bladder issues.  Nausea/Vomiting, if any: no  Pain issues, if any:  no  SAFETY ISSUES:  Prior radiation? no  Pacemaker/ICD? no  Possible current pregnancy? no  Is the patient on methotrexate? no  Current Complaints / other details:  Dr. Denman George is recommending adjuvant carb/tax x 6 cycles and intercollated vaginal brachytherapy.  Patient is here with her husband.  Her bp was elevated today at 148/105 which she said is due to panic attacts.  BP (!) 148/105 (BP Location: Left Wrist, Patient Position: Sitting)   Pulse 71   Temp 98.2 F (36.8 C) (Oral)   Ht 5\' 4"  (1.626 m)   Wt 297 lb 12.8 oz (135.1 kg)   LMP 05/31/2016 (Approximate)   SpO2 98%   BMI 51.12 kg/m    Wt Readings from Last 3 Encounters:  07/22/16 297 lb 12.8 oz (135.1 kg)  07/17/16  294 lb (133.4 kg)  06/25/16 300 lb (136.1 kg)

## 2016-07-10 ENCOUNTER — Telehealth: Payer: Self-pay

## 2016-07-10 NOTE — Telephone Encounter (Signed)
-----   Message from Everitt Amber, MD sent at 07/09/2016  3:52 PM EDT ----- No other tumor sites visible on scan.  Continue with previous recommendation for chemotherapy and radiation.

## 2016-07-10 NOTE — Telephone Encounter (Signed)
Told Ms Hedgecock the results of the CT scan as noted below by Dr. Denman George.

## 2016-07-10 NOTE — Telephone Encounter (Signed)
LM for pt. To call back to discuss the results of her CT scan.

## 2016-07-17 ENCOUNTER — Encounter: Payer: Self-pay | Admitting: Gynecologic Oncology

## 2016-07-17 ENCOUNTER — Ambulatory Visit: Payer: Managed Care, Other (non HMO) | Attending: Gynecologic Oncology | Admitting: Gynecologic Oncology

## 2016-07-17 VITALS — BP 180/94 | HR 78 | Temp 98.2°F | Resp 20 | Wt 294.0 lb

## 2016-07-17 DIAGNOSIS — Z90722 Acquired absence of ovaries, bilateral: Secondary | ICD-10-CM | POA: Insufficient documentation

## 2016-07-17 DIAGNOSIS — Z6841 Body Mass Index (BMI) 40.0 and over, adult: Secondary | ICD-10-CM

## 2016-07-17 DIAGNOSIS — I10 Essential (primary) hypertension: Secondary | ICD-10-CM | POA: Diagnosis not present

## 2016-07-17 DIAGNOSIS — C541 Malignant neoplasm of endometrium: Secondary | ICD-10-CM | POA: Insufficient documentation

## 2016-07-17 DIAGNOSIS — Z90710 Acquired absence of both cervix and uterus: Secondary | ICD-10-CM | POA: Diagnosis not present

## 2016-07-17 DIAGNOSIS — Z7189 Other specified counseling: Secondary | ICD-10-CM | POA: Diagnosis not present

## 2016-07-17 DIAGNOSIS — Z79899 Other long term (current) drug therapy: Secondary | ICD-10-CM | POA: Diagnosis not present

## 2016-07-17 DIAGNOSIS — F419 Anxiety disorder, unspecified: Secondary | ICD-10-CM | POA: Diagnosis not present

## 2016-07-17 DIAGNOSIS — Z803 Family history of malignant neoplasm of breast: Secondary | ICD-10-CM | POA: Insufficient documentation

## 2016-07-17 NOTE — Progress Notes (Signed)
Consult Note: Gyn-Onc  Consult was requested by Dr. Ulanda Edison for the evaluation of Misty [name] 56 y.o. female  CC:  Chief Complaint  Patient presents with  . Endometrial Cancer    Assessment/Plan:  Ms. Deztiny Sarra  is a 56 y.o.  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   I had a lengthy discussion with Ms Vanvranken in which I recommended adjuvant therapy with carboplatin and paclitaxel x 6 cycles with vaginal brachytherapy in accordance with NCCN guidelines. She expressed great concern about this and reluctance to have treatment. When asked why she thought I was recommendign this she replied "because that's what doctors in [place] do. They go ahead and give treatments."  I asked what her understanding of the natural history of her cancer would be if untreated. She believed she would have approximately 8 years of life with good quality of life. She expressed greatest concern about development of lymphedema which her sister developed after treatment with surgery ,chemo and radiation for breast cancer. Her sister had informed her that lymphedema was highest risk for abdominal lymph node surgeries and was caused by surgery and chemotherapy.   I explained to Ms Rinehimer that it was a combination of completed lymphadenectomy and pelvic radiation (both of which are not planned for her) that are associated with highest risk for LE lymphedema. I explained that LE lymphedema from endometrial cancer treatment occurred at a much lower incidence than following treatment for breast cancer (quite different data from that which she had received from her sister). I explained that her risk of lymphedema from compression of the major vessels from untreated tumor recurrence was, in fact, her greatest risk. Therefore, if her goal was to avoid lymphedema, she would be best served by aggressively avoiding pelvic sidewall recurrence with chemotherapy.  I  explained that we recommend chemotherapy and vaginal brachytherapy and have made referrals to Dr's Kinard and Alvy Bimler.  I explained that if she elects for no further treatment, she will need to develop a palliative care and hospice plan including advanced care directives regarding DNR plans as any treatment goals will be palliative if no adjuvant therapy is given, and recurrence and death from cancer will be a certainty.   HPI: Ms Misty [name] is a 56 year old woman who is seen in consultation at the request of Dr Ulanda Edison for grade 2 endometrial cancer.  The patient reports abnormal uterine bleeding for approximately 6 years (2-4 bleeding episodes per year). She had a particularly heavy bleeding episode on 05/21/16 and was seen in the ED. Hb was 7.2g/dL. She was started on Megace. Her bleeding improved.  [place] on 05/29/16 showed a uterus with 9x6x5.7cm dimensions and an endometrial stripe of 24mm.  Endometrial pipelle biopsy on 06/05/16 showed a grade 2 endometrial cancer.  Interval Hx:   On 06/25/16 she underwent robotic assisted total hysterectomy, BSO SLN biopsy. Final pathology showed a stage IIIC 1 grade 3 endometrioid endometrial cancer with deep myometrial invasion, + LVSI, and both right external iliac and right obturator SLN's (total of 5 in aggregate) positive for metastatic disease.   CT chest/abdo/pelvis on 07/08/16 showed no gross residual disease.  Postoperatively she has done well with no complaints.   She expressed deep concern about adjuvant therapies particularly with a concern that they may induce lymphedema that was seen in her sister after treatment for breast cancer. She was considering no further treatment with the hope that she would have an  enduring disease free interval (8+ years) with no side effects and good quality of life.  Current Meds:  Outpatient Encounter Prescriptions as of 07/17/2016  Medication Sig  . ALPRAZolam (XANAX) 0.5 MG tablet Take 0.25-0.5 mg by mouth  daily as needed for anxiety (FOR PANIC/ANXIETY).   . busPIRone (BUSPAR) 15 MG tablet Take 15 mg by mouth daily.   Marland Kitchen escitalopram (LEXAPRO) 20 MG tablet Take 10 mg by mouth daily.   . ferrous sulfate 325 (65 FE) MG tablet Take 325 mg by mouth 2 (two) times daily.  . [DISCONTINUED] oxyCODONE-acetaminophen (PERCOCET/ROXICET) 5-325 MG tablet Take 1-2 tablets by mouth every 4 (four) hours as needed (moderate to severe pain).   No facility-administered encounter medications on file as of 07/17/2016.     Allergy: No Known Allergies  Social Hx:   Social History   Social History  . Marital status: Married    Spouse name: N/A  . Number of children: N/A  . Years of education: N/A   Occupational History  . Not on file.   Social History Main Topics  . Smoking status: Never Smoker  . Smokeless tobacco: Never Used  . Alcohol use 0.6 oz/week    1 Glasses of wine per week     Comment: Occas  . Drug use: No  . Sexual activity: Not on file   Other Topics Concern  . Not on file   Social History Narrative  . No narrative on file    Past Surgical Hx:  Past Surgical History:  Procedure Laterality Date  . CHOLECYSTECTOMY    . LYMPH NODE BIOPSY N/A 06/25/2016   Procedure: LYMPH NODE BIOPSY;  Surgeon: Everitt Amber, MD;  Location: [facility];  Service: Gynecology;  Laterality: N/A;  . ROBOTIC ASSISTED TOTAL HYSTERECTOMY WITH BILATERAL SALPINGO OOPHERECTOMY Bilateral 06/25/2016   Procedure: XI ROBOTIC ASSISTED TOTAL HYSTERECTOMY WITH BILATERAL SALPINGO OOPHORECTOMY for uterus greater 250 grams ;  Surgeon: Everitt Amber, MD;  Location: [facility];  Service: Gynecology;  Laterality: Bilateral;  . TONSILLECTOMY      Past Medical Hx:  Past Medical History:  Diagnosis Date  . Anxiety   . Endometrial cancer ([place])   . Hypertension     Past Gynecological History:  SVD x 2 Patient's last menstrual period was 05/31/2016 (approximate).  Family Hx: History reviewed. No pertinent family history.  Patient's  oldest and youngest sister have history of premenopausal breast cancer (she is one of 8 siblings).  Review of Systems:  Constitutional  Feels well,    ENT Normal appearing ears and nares bilaterally Skin/Breast  No rash, sores, jaundice, itching, dryness Cardiovascular  No chest pain, shortness of breath, or edema  Pulmonary  No cough or wheeze.  Gastro Intestinal  No nausea, vomitting, or diarrhoea. No bright red blood per rectum, no abdominal pain, change in bowel movement, or constipation.  Genito Urinary  No frequency, urgency, dysuria, no vaginal bleeding. Musculo Skeletal  No myalgia, arthralgia, joint swelling or pain  Neurologic  No weakness, numbness, change in gait,  Psychology  No depression, anxiety, insomnia.   labratory assessment: CBC    Component Value Date/Time   WBC 10.6 (H) 06/26/2016 0500   RBC 4.17 06/26/2016 0500   HGB 10.8 (L) 06/26/2016 0500   HGB 14.4 08/28/2009 1342   HCT 36.2 06/26/2016 0500   HCT 43.3 08/28/2009 1342   PLT 285 06/26/2016 0500   PLT 278 08/28/2009 1342   MCV 86.8 06/26/2016 0500   MCV 86.9 08/28/2009 1342  MCH 25.9 (L) 06/26/2016 0500   MCHC 29.8 (L) 06/26/2016 0500   RDW 27.0 (H) 06/26/2016 0500   RDW 17.2 (H) 08/28/2009 1342   LYMPHSABS 2.3 06/23/2016 1422   LYMPHSABS 1.9 08/28/2009 1342   MONOABS 0.8 06/23/2016 1422   MONOABS 1.1 (H) 08/28/2009 1342   EOSABS 0.1 06/23/2016 1422   EOSABS 0.1 08/28/2009 1342   BASOSABS 0.0 06/23/2016 1422   BASOSABS 0.0 08/28/2009 1342    Vitals:  Blood pressure (!) 180/94, pulse 78, temperature 98.2 F (36.8 C), resp. rate 20, weight 294 lb (133.4 kg), last menstrual period 05/31/2016, SpO2 100 %.  Physical Exam: WD in NAD Neck  Supple NROM, without any enlargements.  Lymph Node Survey No cervical supraclavicular or inguinal adenopathy Cardiovascular  Pulse normal rate, regularity and rhythm. S1 and S2 normal.  Lungs  Clear to auscultation bilateraly, without  wheezes/crackles/rhonchi. Good air movement.  Skin  No rash/lesions/breakdown  Psychiatry  Alert and oriented to person, place, and time  Abdomen  Normoactive bowel sounds, abdomen soft, non-tender and obese with pannus without vidence of hernia. Incisions well healed. Back No CVA tenderness Genito Urinary  Vulva/vagina: Normal external female genitalia.  No lesions. No discharge or bleeding.  Bladder/urethra:  No lesions or masses, well supported bladder  Vagina: normal, some prolapse. No masses. Vaginal cuff well healed. No blood.  Adnexa: no discretely palpable masses. Rectal  deferred Extremities  No bilateral cyanosis, clubbing or edema.   30 minutes of direct face to face counseling time was spent with the patient. This included discussion about prognosis, therapy recommendations and postoperative side effects and are beyond the scope of routine postoperative care.  Donaciano Eva, MD  07/17/2016, 4:27 PM

## 2016-07-17 NOTE — Patient Instructions (Signed)
Please let us know what you decide to do in regards to treatment and a follow up schedule will be arranged.  Please call for any questions or concerns.

## 2016-07-22 ENCOUNTER — Ambulatory Visit
Admission: RE | Admit: 2016-07-22 | Discharge: 2016-07-22 | Disposition: A | Discharge status: Home / Self Care | Payer: Managed Care, Other (non HMO) | Source: Ambulatory Visit | Attending: Radiation Oncology | Admitting: Radiation Oncology

## 2016-07-22 ENCOUNTER — Encounter: Payer: Self-pay | Admitting: Radiation Oncology

## 2016-07-22 ENCOUNTER — Ambulatory Visit (HOSPITAL_BASED_OUTPATIENT_CLINIC_OR_DEPARTMENT_OTHER): Payer: Managed Care, Other (non HMO) | Admitting: Hematology and Oncology

## 2016-07-22 ENCOUNTER — Encounter: Payer: Self-pay | Admitting: Hematology and Oncology

## 2016-07-22 ENCOUNTER — Telehealth: Payer: Self-pay | Admitting: Hematology and Oncology

## 2016-07-22 VITALS — BP 160/66 | HR 92 | Temp 98.7°F | Resp 18 | Ht 64.0 in | Wt 299.3 lb

## 2016-07-22 VITALS — BP 148/105 | HR 71 | Temp 98.2°F | Ht 64.0 in | Wt 297.8 lb

## 2016-07-22 DIAGNOSIS — Z79899 Other long term (current) drug therapy: Secondary | ICD-10-CM | POA: Insufficient documentation

## 2016-07-22 DIAGNOSIS — Z808 Family history of malignant neoplasm of other organs or systems: Secondary | ICD-10-CM | POA: Diagnosis not present

## 2016-07-22 DIAGNOSIS — Z803 Family history of malignant neoplasm of breast: Secondary | ICD-10-CM | POA: Diagnosis not present

## 2016-07-22 DIAGNOSIS — C541 Malignant neoplasm of endometrium: Secondary | ICD-10-CM

## 2016-07-22 DIAGNOSIS — Z90722 Acquired absence of ovaries, bilateral: Secondary | ICD-10-CM | POA: Insufficient documentation

## 2016-07-22 DIAGNOSIS — D500 Iron deficiency anemia secondary to blood loss (chronic): Secondary | ICD-10-CM

## 2016-07-22 DIAGNOSIS — Z809 Family history of malignant neoplasm, unspecified: Secondary | ICD-10-CM

## 2016-07-22 DIAGNOSIS — C775 Secondary and unspecified malignant neoplasm of intrapelvic lymph nodes: Secondary | ICD-10-CM

## 2016-07-22 DIAGNOSIS — Z6841 Body Mass Index (BMI) 40.0 and over, adult: Secondary | ICD-10-CM | POA: Diagnosis not present

## 2016-07-22 DIAGNOSIS — F419 Anxiety disorder, unspecified: Secondary | ICD-10-CM | POA: Insufficient documentation

## 2016-07-22 DIAGNOSIS — I10 Essential (primary) hypertension: Secondary | ICD-10-CM | POA: Diagnosis not present

## 2016-07-22 DIAGNOSIS — Z90710 Acquired absence of both cervix and uterus: Secondary | ICD-10-CM | POA: Diagnosis not present

## 2016-07-22 HISTORY — DX: Malignant neoplasm of endometrium: C54.1

## 2016-07-22 NOTE — Progress Notes (Signed)
START ON PATHWAY REGIMEN - Uterine     A cycle is every 21 days:     Paclitaxel      Carboplatin   **Always confirm dose/schedule in your pharmacy ordering system**    Patient Characteristics: Endometrioid Histology, First Line - Newly Diagnosed, Medically Operable, Stage IIIC1/IIIC2 - Grade 1, 2, or 3 AJCC T Category: T1 AJCC N Category: N1 AJCC M Category: M0 AJCC 8 Stage Grouping: IIIC1 Line of therapy: First Line - Newly Diagnosed Would you be surprised if this patient died  in the next year<= I would be surprised if this patient died in the next year Patient Status: Medically Operable Intent of Therapy: Curative Intent, Discussed with Patient 

## 2016-07-22 NOTE — Progress Notes (Signed)
Please see the Nurse Progress Note in the MD Initial Consult Encounter for this patient. 

## 2016-07-22 NOTE — Telephone Encounter (Signed)
Gv pt appts for 7/23 + 7/24.

## 2016-07-22 NOTE — Progress Notes (Signed)
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 female who initially presented with abnormal uterine bleeding for approximately 6 years, with 2-4 bleeding episodes each year. She did not experience pelvic pain.  She had a particularly heavy bleeding episode on 05/21/16 and was seen in the ED. Hb was 7.2g/dL. She was started on Megace. Her bleeding improved.  [place] on 05/29/16 showed a uterus with 9x6x5.7cm dimensions and an endometrial stripe of 72mm.  Endometrial pipelle biopsy on 06/05/16 showed a grade 2 endometrial cancer. Patient was seen by Dr. Denman George who recommended surgery.  Sentinel lymph node biopsy on 06/25/16 revealed metastatic adenocarcinoma in 5 out of 5 sampled lymph nodes. On this day the patient also underwent total hysterectomy with bilateral salpingo oophorectomy. Surgical pathology showed endometrioid adenocarcinoma, FIGO grade 3. The tumor invades half of the myometrium. All margins negative. Benign bilateral ovaries and fallopian tubes.  On 07/08/16 the patient underwent CT CAP, which showed no postoperative complicating features. Small bilateral pelvic lymph nodes are indeterminate. No findings for metastatic disease involving the chest or abdomen.  Per Dr. Serita Grit consult note from 07/17/16, Dr. Denman George recommends 6 cycles of adjuvant carboplatin/taxol and intercalated vaginal brachytherapy.  The patient presents to the clinic today to discuss the role that radiation therapy may play in the treatment of her disease. She is joined today by her husband.  On review of systems, the patient denies any recent weight changes. She denies pain. She  denies headaches or dizziness. She denies nausea or vomiting. She reports her bowels are moving slower since surgery. She denies bladder concerns. Nursing notes the patient's BP was elevated today at 148/105, which the patient attributes to panic attacks.  Of note, the patient has 2 sisters with history of breast cancer.  The patient is scheduled to consult with Dr. Alvy Bimler today following this encounter.  PREVIOUS RADIATION THERAPY: No  PAST MEDICAL HISTORY:  has a past medical history of Anxiety; Endometrial cancer ([place]); and Hypertension.    PAST SURGICAL HISTORY: Past Surgical History:  Procedure Laterality Date  . CHOLECYSTECTOMY    . LYMPH NODE BIOPSY N/A 06/25/2016   Procedure: LYMPH NODE BIOPSY;  Surgeon: Everitt Amber, MD;  Location: [facility];  Service: Gynecology;  Laterality: N/A;  . ROBOTIC ASSISTED TOTAL HYSTERECTOMY WITH BILATERAL SALPINGO OOPHERECTOMY Bilateral 06/25/2016   Procedure: XI ROBOTIC ASSISTED TOTAL HYSTERECTOMY WITH BILATERAL SALPINGO OOPHORECTOMY for uterus greater 250 grams ;  Surgeon: Everitt Amber, MD;  Location: [facility];  Service: Gynecology;  Laterality: Bilateral;  . TONSILLECTOMY      FAMILY HISTORY: family history includes Breast cancer in her sister and sister; Cancer in her paternal grandfather; Thyroid cancer in her sister.  SOCIAL HISTORY:  reports that she has never smoked. She has never used smokeless tobacco. She reports that she drinks about 0.6 oz of alcohol per week . She reports that she does not use drugs. The patient lives with her husband in [place].  ALLERGIES: Patient has no known allergies.  MEDICATIONS:  Current Outpatient Prescriptions  Medication Sig Dispense Refill  . ALPRAZolam (XANAX) 0.5 MG tablet Take 0.25-0.5 mg by mouth daily as needed for anxiety (FOR PANIC/ANXIETY).     Marland Kitchen  busPIRone (BUSPAR) 15 MG tablet Take 15 mg by mouth daily.     Marland Kitchen escitalopram (LEXAPRO) 20 MG tablet Take 10 mg by mouth daily.      No current  facility-administered medications for this encounter.     REVIEW OF SYSTEMS:  A 10+ POINT REVIEW OF SYSTEMS WAS OBTAINED including neurology, dermatology, psychiatry, cardiac, respiratory, lymph, extremities, GI, GU, musculoskeletal, constitutional, reproductive, HEENT. All pertinent positives are noted in the HPI. All others are negative.   PHYSICAL EXAM:  height is 5\' 4"  (1.626 m) and weight is 297 lb 12.8 oz (135.1 kg). Her oral temperature is 98.2 F (36.8 C). Her blood pressure is 148/105 (abnormal) and her pulse is 71. Her oxygen saturation is 98%.   General: Alert and oriented, in no acute distress. HEENT: Head is normocephalic. Extraocular movements are intact. PERRL. Oropharynx is clear. Neck: Neck is supple, no palpable cervical or supraclavicular lymphadenopathy. Heart: Regular in rate and rhythm with no murmurs. Chest: Clear to auscultation bilaterally. Abdomen: Soft, non tender, non distended. Small scars from her laparoscopic procedure, all healing well without signs of drainage or infection. Extremities: Distal pulses intact. No edema. Lymphatics: see Neck Exam Skin: No concerning lesions. Musculoskeletal: symmetric strength and muscle tone throughout. Neurologic: Cranial nerves II through XII are grossly intact. No obvious focalities. Speech is fluent. Coordination is intact. Psychiatric: Judgment and insight are intact. Affect is appropriate.  Pelvic exam deferred in light of recent surgery.  ECOG = 1   LABORATORY DATA:  Lab Results  Component Value Date   WBC 10.6 (H) 06/26/2016   HGB 10.8 (L) 06/26/2016   HCT 36.2 06/26/2016   MCV 86.8 06/26/2016   PLT 285 06/26/2016   NEUTROABS 7.6 06/23/2016   Lab Results  Component Value Date   NA 140 06/26/2016   K 4.8 06/26/2016   CL 105 06/26/2016   CO2 28 06/26/2016   GLUCOSE 146 (H) 06/26/2016   CREATININE 0.73 06/26/2016   CALCIUM 8.5 (L) 06/26/2016    RADIOGRAPHY: Dg Chest 2 View  Result Date:  06/23/2016 CLINICAL DATA:  Pre op hysterectomy, endometrial cancer Never a smoker - pt states no other chest hx EXAM: CHEST  2 VIEW COMPARISON:  09/12/2013 FINDINGS: Lateral view degraded by patient arm position. Midline trachea. Mild cardiomegaly with a tortuous descending thoracic aorta. No pleural effusion or pneumothorax. Clear lungs. IMPRESSION: Mild cardiomegaly, without acute disease or evidence of metastatic disease. Electronically Signed   By: Abigail Miyamoto M.D.   On: 06/23/2016 16:43   Ct Chest W Contrast  Result Date: 07/08/2016 CLINICAL DATA:  Staging endometrial cancer. Recent hysterectomy and bilateral oophorectomy. EXAM: CT CHEST, ABDOMEN, AND PELVIS WITH CONTRAST TECHNIQUE: Multidetector CT imaging of the chest, abdomen and pelvis was performed following the standard protocol during bolus administration of intravenous contrast. CONTRAST:  129mL ISOVUE-300 IOPAMIDOL (ISOVUE-300) INJECTION 61% COMPARISON:  None. FINDINGS: CT CHEST FINDINGS Cardiovascular: The heart is normal in size. No pericardial effusion. Minimal scattered atherosclerotic calcifications involving the aorta but no aneurysm or dissection. Scattered coronary artery calcifications are noted. Mediastinum/Nodes: No mediastinal or hilar mass or lymphadenopathy. The esophagus is grossly normal. There is a moderate to large hiatal hernia. Lungs/Pleura: No acute pulmonary findings. No worrisome pulmonary nodules to suggest metastatic disease. Minimal areas of subpleural atelectasis. No pleural effusion or pleural lesions. Chest wall/ Musculoskeletal: No breast masses, supraclavicular or axillary lymphadenopathy. The thyroid gland appears normal. No significant bony findings. Moderate degenerative changes involving the lower thoracic spine. CT ABDOMEN PELVIS  FINDINGS Hepatobiliary: No focal hepatic lesions or intrahepatic biliary dilatation. The gallbladder surgically absent. No common bile duct dilatation. Pancreas: No mass, inflammation  or ductal dilatation. Spleen: Normal size.  No focal lesions. Adrenals/Urinary Tract: The adrenal glands and kidneys are unremarkable. No renal lesions, renal calculi or obstructing ureteral calculi. The bladder is decompressed but appears normal. Stomach/Bowel: The stomach, duodenum, small bowel and colon are unremarkable. No acute inflammatory changes, mass lesions or obstructive findings. The terminal ileum is normal. The appendix is normal. Vascular/Lymphatic: Moderate atherosclerotic calcifications involving the aorta and iliac arteries. No aneurysm or dissection. The branch vessels are patent. Calcifications noted at the renal artery ostia. A few small scattered mesenteric and retroperitoneal lymph nodes but no mass or overt adenopathy. In the pelvis there are small left-sided external iliac region lymph nodes, none measuring more than 6 mm but still somewhat worrisome. Some of these appear to be enhancing. There is a 6 mm node on image 107, 6 mm node on image 110 and 5 mm node on image 115. On the right side there is a 6 mm obturator lymph node on image number 117. Reproductive: Surgically absent. Other: Expected postoperative changes in the pelvis. No worrisome postop fluid collections or hematoma. No abscess. There is a large right inguinal hernia containing fat, fluid and calcifications. Small periumbilical abdominal wall hernia containing fat Musculoskeletal: No significant bony findings. IMPRESSION: 1. Status post hysterectomy and bilateral salpingo-oophorectomy. No postoperative complicating features such as abnormal fluid collection or hematoma. 2. Small bilateral pelvic lymph nodes are indeterminate but recommend surveillance. 3. No findings for metastatic disease involving the chest or abdomen. 4. Moderate to large hiatal hernia. 5. Age advanced atherosclerotic calcifications involving the aorta and iliac arteries. Electronically Signed   By: Marijo Sanes M.D.   On: 07/08/2016 12:03   Ct Abdomen  Pelvis W Contrast  Result Date: 07/08/2016 CLINICAL DATA:  Staging endometrial cancer. Recent hysterectomy and bilateral oophorectomy. EXAM: CT CHEST, ABDOMEN, AND PELVIS WITH CONTRAST TECHNIQUE: Multidetector CT imaging of the chest, abdomen and pelvis was performed following the standard protocol during bolus administration of intravenous contrast. CONTRAST:  169mL ISOVUE-300 IOPAMIDOL (ISOVUE-300) INJECTION 61% COMPARISON:  None. FINDINGS: CT CHEST FINDINGS Cardiovascular: The heart is normal in size. No pericardial effusion. Minimal scattered atherosclerotic calcifications involving the aorta but no aneurysm or dissection. Scattered coronary artery calcifications are noted. Mediastinum/Nodes: No mediastinal or hilar mass or lymphadenopathy. The esophagus is grossly normal. There is a moderate to large hiatal hernia. Lungs/Pleura: No acute pulmonary findings. No worrisome pulmonary nodules to suggest metastatic disease. Minimal areas of subpleural atelectasis. No pleural effusion or pleural lesions. Chest wall/ Musculoskeletal: No breast masses, supraclavicular or axillary lymphadenopathy. The thyroid gland appears normal. No significant bony findings. Moderate degenerative changes involving the lower thoracic spine. CT ABDOMEN PELVIS FINDINGS Hepatobiliary: No focal hepatic lesions or intrahepatic biliary dilatation. The gallbladder surgically absent. No common bile duct dilatation. Pancreas: No mass, inflammation or ductal dilatation. Spleen: Normal size.  No focal lesions. Adrenals/Urinary Tract: The adrenal glands and kidneys are unremarkable. No renal lesions, renal calculi or obstructing ureteral calculi. The bladder is decompressed but appears normal. Stomach/Bowel: The stomach, duodenum, small bowel and colon are unremarkable. No acute inflammatory changes, mass lesions or obstructive findings. The terminal ileum is normal. The appendix is normal. Vascular/Lymphatic: Moderate atherosclerotic  calcifications involving the aorta and iliac arteries. No aneurysm or dissection. The branch vessels are patent. Calcifications noted at the renal artery ostia. A few small scattered mesenteric and  retroperitoneal lymph nodes but no mass or overt adenopathy. In the pelvis there are small left-sided external iliac region lymph nodes, none measuring more than 6 mm but still somewhat worrisome. Some of these appear to be enhancing. There is a 6 mm node on image 107, 6 mm node on image 110 and 5 mm node on image 115. On the right side there is a 6 mm obturator lymph node on image number 117. Reproductive: Surgically absent. Other: Expected postoperative changes in the pelvis. No worrisome postop fluid collections or hematoma. No abscess. There is a large right inguinal hernia containing fat, fluid and calcifications. Small periumbilical abdominal wall hernia containing fat Musculoskeletal: No significant bony findings. IMPRESSION: 1. Status post hysterectomy and bilateral salpingo-oophorectomy. No postoperative complicating features such as abnormal fluid collection or hematoma. 2. Small bilateral pelvic lymph nodes are indeterminate but recommend surveillance. 3. No findings for metastatic disease involving the chest or abdomen. 4. Moderate to large hiatal hernia. 5. Age advanced atherosclerotic calcifications involving the aorta and iliac arteries. Electronically Signed   By: Marijo Sanes M.D.   On: 07/08/2016 12:03      IMPRESSION: Misty [name] is a 56 y.o. 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 She would be at risk for vaginal cuff recurrence and would agree with Dr. Serita Grit recommendations for vaginal brachytherapy.  Today, I talked to the patient and family about the findings and work-up thus far.  We discussed the natural history of endometrial cancer and general treatment, highlighting the role of radiotherapy in the management.  We discussed the available radiation techniques,  and focused on the details of logistics and delivery.  We reviewed the anticipated acute and late sequelae associated with radiation in this setting. This patient is a good candidate for 5 fractions of vaginal brachytherapy. The patient was encouraged to ask questions that I answered to the best of my ability.   The patient would like to proceed with brachytherapy and will be scheduled for CT simulation. A consent form was discussed, signed, and placed in the patient's chart.   PLAN: The patient would like to proceed with vaginal brachytherapy. She will be scheduled for CT Simulation in the near future, allowing at least 6 weeks to recover from her prior surgery. She will likely begin radiation therapy during her second or third cycle of chemotherapy. This plan may change as the patient's candidacy for chemotherapy is determined. She will meet with Dr. Alvy Bimler later this afternoon to discuss this.     ------------------------------------------------  Blair Promise, PhD, MD  This document serves as a record of services personally performed by Gery Pray, MD. It was created on his behalf by Maryla Morrow, a trained medical scribe. The creation of this record is based on the scribe's personal observations and the provider's statements to them. This document has been checked and approved by the attending provider.

## 2016-07-23 ENCOUNTER — Encounter: Payer: Self-pay | Admitting: Hematology and Oncology

## 2016-07-23 DIAGNOSIS — Z809 Family history of malignant neoplasm, unspecified: Secondary | ICD-10-CM | POA: Insufficient documentation

## 2016-07-23 NOTE — Assessment & Plan Note (Addendum)
I have long discussion with the patient and her husband related to the role of adjuvant chemotherapy The patient will receive adjuvant radiation treatment as well I recommend chemo education class, port placement, and return visit for chemotherapy consent The patient has declined port placement Some of the expected risks, benefits, side effects of treatment were discussed with the patient and she agreed to proceed

## 2016-07-23 NOTE — Progress Notes (Signed)
[place] CONSULT NOTE  Patient Care Team: Patient, No Pcp Per as PCP - General (General Practice)  CHIEF COMPLAINTS/PURPOSE OF CONSULTATION:  Recent diagnosis of locally advanced endometrial cancer, for further management  HISTORY OF PRESENTING ILLNESS:  Misty [name] 56 y.o. female is here because of recent diagnosis of endometrial cancer. Her husband, [name] is present. The patient initially presented with heavy menstruation for a long time When she presented recently with severe anemia, she had near presyncopal episode and originally thought to be dehydrated until she was found to be profoundly anemic She was started on oral iron supplement She never received blood transfusion prior to diagnosis I review her records extensively and summarized as follows: Oncology History   MSI instability not detected     Endometrial cancer ([place])   05/19/2016 Initial Diagnosis    The patient reports abnormal uterine bleeding for approximately 6 years (2-4 bleeding episodes per year). She had a particularly heavy bleeding episode on 05/21/16 and was seen in the ED. Hb was 7.2g/dL. She was started on Megace. Her bleeding improved.       05/29/2016 Imaging    [place] on 05/29/16 showed a uterus with 9x6x5.7cm dimensions and an endometrial stripe of 76m.       06/05/2016 Procedure    Endometrial pipelle biopsy on 06/05/16 showed a grade 2 endometrial cancer       06/25/2016 Pathology Results    1. Lymph node, sentinel, biopsy, right external iliac METASTATIC ADENOCARCINOMA IN ONE OF ONE LYMPH NODE (1/1) 2. Lymph node, sentinel, biopsy, right obturator METASTATIC ADENOCARCINOMA IN FOUR OF FOUR LYMPH NODES (4/4) 3. Uterus +/- tubes/ovaries, neoplastic, cervix ENDOMETRIOID ADENOCARCINOMA, FIGO GRADE 3, THE TUMOR INVADES OUTER HALF OF THE MYOMETRIUM (PT1B) ALL MARGINS OF RESECTION ARE NEGATIVE FOR TUMOR BENIGN BILATERAL OVARIES AND FALLOPIAN TUBES Microscopic Comment 3. ONCOLOGY  TABLE-UTERUS, CARCINOMA OR CARCINOSARCOMA  Specimen: Uterus, bilateral fallopian tubes and ovaries Procedure: Hysterectomy bilateral salpingo-oophorectomy Lymph node sampling performed: Yes Specimen integrity: Intact Maximum tumor size: 6.2 cm Histologic type: Endometrioid adenocarcinoma Grade: 3 Myometrial invasion: 1.5 cm where myometrium is 2.6 cm in thickness Cervical stromal involvement: Negative Extent of involvement of other organs: Negative Lymph - vascular invasion: Identified Peritoneal washings: Not performed Lymph nodes: Examined: 5 Sentinel 0 Non-sentinel 5 Total Lymph nodes with metastasis: 5 Isolated tumor cells (< 0.2 mm): 0 Micrometastasis: (> 0.2 mm and < 2.0 mm): 2 Macrometastasis: (> 2.0 mm): 3 Extracapsular extension: 1 Pelvic lymph nodes: 5 involved of 5 lymph nodes. Para-aortic lymph nodes: NA involved of NA lymph nodes. Other (specify involvement and site): NA TNM code: pT1b, pN1 FIGO Stage (based on pathologic findings, needs clinical correlation): IIIC1       06/25/2016 Surgery    Surgeon: RDonaciano Eva  Operation: Robotic-assisted laparoscopic total hysterectomy uterus >250gm with bilateral salpingoophorectomy, SLN biopsy  Operative Findings:  : 12cm uterus, normal tubes and ovaries. Large volume visceral adiposity limiting exposure and visualization and increasing the duration of the procedure.        07/08/2016 Imaging    CT scan of chest, abdomen and pelvis 1. Status post hysterectomy and bilateral salpingo-oophorectomy. No postoperative complicating features such as abnormal fluid collection or hematoma. 2. Small bilateral pelvic lymph nodes are indeterminate but recommend surveillance. 3. No findings for metastatic disease involving the chest or abdomen. 4. Moderate to large hiatal hernia. 5. Age advanced atherosclerotic calcifications involving the aorta and iliac arteries.      She is recovering well  from recent surgery.   No signs of wound infection and she denies excessive pain. The patient denies any recent signs or symptoms of bleeding such as spontaneous epistaxis, hematuria or hematochezia.  MEDICAL HISTORY:  Past Medical History:  Diagnosis Date  . Anxiety   . Endometrial cancer ([place])   . Hypertension     SURGICAL HISTORY: Past Surgical History:  Procedure Laterality Date  . CHOLECYSTECTOMY    . LYMPH NODE BIOPSY N/A 06/25/2016   Procedure: LYMPH NODE BIOPSY;  Surgeon: Everitt Amber, MD;  Location: [facility];  Service: Gynecology;  Laterality: N/A;  . ROBOTIC ASSISTED TOTAL HYSTERECTOMY WITH BILATERAL SALPINGO OOPHERECTOMY Bilateral 06/25/2016   Procedure: XI ROBOTIC ASSISTED TOTAL HYSTERECTOMY WITH BILATERAL SALPINGO OOPHORECTOMY for uterus greater 250 grams ;  Surgeon: Everitt Amber, MD;  Location: [facility];  Service: Gynecology;  Laterality: Bilateral;  . TONSILLECTOMY      SOCIAL HISTORY: Social History   Social History  . Marital status: Married    Spouse name: [name]  . Number of children: 2  . Years of education: N/A   Occupational History  . retired    Social History Main Topics  . Smoking status: Never Smoker  . Smokeless tobacco: Never Used  . Alcohol use 0.6 oz/week    1 Glasses of wine per week     Comment: Occas  . Drug use: No  . Sexual activity: Not on file   Other Topics Concern  . Not on file   Social History Narrative  . No narrative on file    FAMILY HISTORY: Family History  Problem Relation Age of Onset  . Breast cancer Sister   . Cancer Paternal Grandfather   . Breast cancer Sister   . Thyroid cancer Sister     ALLERGIES:  has No Known Allergies.  MEDICATIONS:  Current Outpatient Prescriptions  Medication Sig Dispense Refill  . ALPRAZolam (XANAX) 0.5 MG tablet Take 0.25-0.5 mg by mouth daily as needed for anxiety (FOR PANIC/ANXIETY).     . busPIRone (BUSPAR) 15 MG tablet Take 15 mg by mouth daily.     Marland Kitchen escitalopram (LEXAPRO) 20 MG tablet Take 10 mg by  mouth daily.      No current facility-administered medications for this visit.     REVIEW OF SYSTEMS:   Constitutional: Denies fevers, chills or abnormal night sweats Eyes: Denies blurriness of vision, double vision or watery eyes Ears, nose, mouth, throat, and face: Denies mucositis or sore throat Respiratory: Denies cough, dyspnea or wheezes Cardiovascular: Denies palpitation, chest discomfort or lower extremity swelling Gastrointestinal:  Denies nausea, heartburn or change in bowel habits Skin: Denies abnormal skin rashes Lymphatics: Denies new lymphadenopathy or easy bruising Neurological:Denies numbness, tingling or new weaknesses Behavioral/Psych: Mood is stable, no new changes  All other systems were reviewed with the patient and are negative.  PHYSICAL EXAMINATION: ECOG PERFORMANCE STATUS: 1 - Symptomatic but completely ambulatory  Vitals:   07/22/16 1317  BP: (!) 160/66  Pulse: 92  Resp: 18  Temp: 98.7 F (37.1 C)   Filed Weights   07/22/16 1317  Weight: 299 lb 4.8 oz (135.8 kg)    GENERAL:alert, no distress and comfortable.  She is morbidly obese SKIN: skin color, texture, turgor are normal, no rashes or significant lesions EYES: normal, conjunctiva are pink and non-injected, sclera clear OROPHARYNX:no exudate, no erythema and lips, buccal mucosa, and tongue normal  NECK: supple, thyroid normal size, non-tender, without nodularity LYMPH:  no palpable lymphadenopathy in the cervical, axillary or  inguinal LUNGS: clear to auscultation and percussion with normal breathing effort HEART: regular rate & rhythm and no murmurs and no lower extremity edema ABDOMEN:abdomen soft, non-tender and normal bowel sounds Musculoskeletal:no cyanosis of digits and no clubbing  PSYCH: alert & oriented x 3 with fluent speech NEURO: no focal motor/sensory deficits  LABORATORY DATA:  I have reviewed the data as listed Lab Results  Component Value Date   WBC 10.6 (H) 06/26/2016    HGB 10.8 (L) 06/26/2016   HCT 36.2 06/26/2016   MCV 86.8 06/26/2016   PLT 285 06/26/2016    Recent Labs  06/23/16 1422 06/26/16 0500  NA 142 140  K 5.6* 4.8  CL 111 105  CO2 27 28  GLUCOSE 104* 146*  BUN 11 10  CREATININE 0.63 0.73  CALCIUM 9.4 8.5*  GFRNONAA >60 >60  GFRAA >60 >60  PROT 6.7  --   ALBUMIN 3.8  --   AST 16  --   ALT 16  --   ALKPHOS 53  --   BILITOT 0.2*  --     RADIOGRAPHIC STUDIES: I have personally reviewed the radiological images as listed and agreed with the findings in the report. Ct Chest W Contrast  Result Date: 07/08/2016 CLINICAL DATA:  Staging endometrial cancer. Recent hysterectomy and bilateral oophorectomy. EXAM: CT CHEST, ABDOMEN, AND PELVIS WITH CONTRAST TECHNIQUE: Multidetector CT imaging of the chest, abdomen and pelvis was performed following the standard protocol during bolus administration of intravenous contrast. CONTRAST:  157m ISOVUE-300 IOPAMIDOL (ISOVUE-300) INJECTION 61% COMPARISON:  None. FINDINGS: CT CHEST FINDINGS Cardiovascular: The heart is normal in size. No pericardial effusion. Minimal scattered atherosclerotic calcifications involving the aorta but no aneurysm or dissection. Scattered coronary artery calcifications are noted. Mediastinum/Nodes: No mediastinal or hilar mass or lymphadenopathy. The esophagus is grossly normal. There is a moderate to large hiatal hernia. Lungs/Pleura: No acute pulmonary findings. No worrisome pulmonary nodules to suggest metastatic disease. Minimal areas of subpleural atelectasis. No pleural effusion or pleural lesions. Chest wall/ Musculoskeletal: No breast masses, supraclavicular or axillary lymphadenopathy. The thyroid gland appears normal. No significant bony findings. Moderate degenerative changes involving the lower thoracic spine. CT ABDOMEN PELVIS FINDINGS Hepatobiliary: No focal hepatic lesions or intrahepatic biliary dilatation. The gallbladder surgically absent. No common bile duct  dilatation. Pancreas: No mass, inflammation or ductal dilatation. Spleen: Normal size.  No focal lesions. Adrenals/Urinary Tract: The adrenal glands and kidneys are unremarkable. No renal lesions, renal calculi or obstructing ureteral calculi. The bladder is decompressed but appears normal. Stomach/Bowel: The stomach, duodenum, small bowel and colon are unremarkable. No acute inflammatory changes, mass lesions or obstructive findings. The terminal ileum is normal. The appendix is normal. Vascular/Lymphatic: Moderate atherosclerotic calcifications involving the aorta and iliac arteries. No aneurysm or dissection. The branch vessels are patent. Calcifications noted at the renal artery ostia. A few small scattered mesenteric and retroperitoneal lymph nodes but no mass or overt adenopathy. In the pelvis there are small left-sided external iliac region lymph nodes, none measuring more than 6 mm but still somewhat worrisome. Some of these appear to be enhancing. There is a 6 mm node on image 107, 6 mm node on image 110 and 5 mm node on image 115. On the right side there is a 6 mm obturator lymph node on image number 117. Reproductive: Surgically absent. Other: Expected postoperative changes in the pelvis. No worrisome postop fluid collections or hematoma. No abscess. There is a large right inguinal hernia containing fat, fluid and calcifications. Small  periumbilical abdominal wall hernia containing fat Musculoskeletal: No significant bony findings. IMPRESSION: 1. Status post hysterectomy and bilateral salpingo-oophorectomy. No postoperative complicating features such as abnormal fluid collection or hematoma. 2. Small bilateral pelvic lymph nodes are indeterminate but recommend surveillance. 3. No findings for metastatic disease involving the chest or abdomen. 4. Moderate to large hiatal hernia. 5. Age advanced atherosclerotic calcifications involving the aorta and iliac arteries. Electronically Signed   By: Marijo Sanes  M.D.   On: 07/08/2016 12:03   Ct Abdomen Pelvis W Contrast  Result Date: 07/08/2016 CLINICAL DATA:  Staging endometrial cancer. Recent hysterectomy and bilateral oophorectomy. EXAM: CT CHEST, ABDOMEN, AND PELVIS WITH CONTRAST TECHNIQUE: Multidetector CT imaging of the chest, abdomen and pelvis was performed following the standard protocol during bolus administration of intravenous contrast. CONTRAST:  155m ISOVUE-300 IOPAMIDOL (ISOVUE-300) INJECTION 61% COMPARISON:  None. FINDINGS: CT CHEST FINDINGS Cardiovascular: The heart is normal in size. No pericardial effusion. Minimal scattered atherosclerotic calcifications involving the aorta but no aneurysm or dissection. Scattered coronary artery calcifications are noted. Mediastinum/Nodes: No mediastinal or hilar mass or lymphadenopathy. The esophagus is grossly normal. There is a moderate to large hiatal hernia. Lungs/Pleura: No acute pulmonary findings. No worrisome pulmonary nodules to suggest metastatic disease. Minimal areas of subpleural atelectasis. No pleural effusion or pleural lesions. Chest wall/ Musculoskeletal: No breast masses, supraclavicular or axillary lymphadenopathy. The thyroid gland appears normal. No significant bony findings. Moderate degenerative changes involving the lower thoracic spine. CT ABDOMEN PELVIS FINDINGS Hepatobiliary: No focal hepatic lesions or intrahepatic biliary dilatation. The gallbladder surgically absent. No common bile duct dilatation. Pancreas: No mass, inflammation or ductal dilatation. Spleen: Normal size.  No focal lesions. Adrenals/Urinary Tract: The adrenal glands and kidneys are unremarkable. No renal lesions, renal calculi or obstructing ureteral calculi. The bladder is decompressed but appears normal. Stomach/Bowel: The stomach, duodenum, small bowel and colon are unremarkable. No acute inflammatory changes, mass lesions or obstructive findings. The terminal ileum is normal. The appendix is normal.  Vascular/Lymphatic: Moderate atherosclerotic calcifications involving the aorta and iliac arteries. No aneurysm or dissection. The branch vessels are patent. Calcifications noted at the renal artery ostia. A few small scattered mesenteric and retroperitoneal lymph nodes but no mass or overt adenopathy. In the pelvis there are small left-sided external iliac region lymph nodes, none measuring more than 6 mm but still somewhat worrisome. Some of these appear to be enhancing. There is a 6 mm node on image 107, 6 mm node on image 110 and 5 mm node on image 115. On the right side there is a 6 mm obturator lymph node on image number 117. Reproductive: Surgically absent. Other: Expected postoperative changes in the pelvis. No worrisome postop fluid collections or hematoma. No abscess. There is a large right inguinal hernia containing fat, fluid and calcifications. Small periumbilical abdominal wall hernia containing fat Musculoskeletal: No significant bony findings. IMPRESSION: 1. Status post hysterectomy and bilateral salpingo-oophorectomy. No postoperative complicating features such as abnormal fluid collection or hematoma. 2. Small bilateral pelvic lymph nodes are indeterminate but recommend surveillance. 3. No findings for metastatic disease involving the chest or abdomen. 4. Moderate to large hiatal hernia. 5. Age advanced atherosclerotic calcifications involving the aorta and iliac arteries. Electronically Signed   By: PMarijo SanesM.D.   On: 07/08/2016 12:03    ASSESSMENT & PLAN:  [place] I have long discussion with the patient and her husband related to the role of adjuvant chemotherapy The patient will receive adjuvant radiation treatment as well  I recommend chemo education class, port placement, and return visit for chemotherapy consent The patient has declined port placement Some of the expected risks, benefits, side effects of treatment were discussed with the patient and she agreed  to proceed  Morbid obesity with BMI of 50.0-59.9, adult ([place]) The patient is morbidly obese Certainly, the dosing of the chemotherapy could be challenging with potential excessive risk of pancytopenia However, given her young age, we will proceed with initial treatment without growth factor support in accordance to NCCN guidelines  Iron deficiency anemia due to chronic blood loss She appears to be doing well and has never received blood transfusion I will repeat iron studies on her return visit  Family history of cancer She has strong family history of cancer I recommend consideration for genetic counseling   Orders Placed This Encounter  Procedures  . CBC with Differential    Standing Status:   Standing    Number of Occurrences:   20    Standing Expiration Date:   07/24/2017  . Comprehensive metabolic panel    Standing Status:   Standing    Number of Occurrences:   20    Standing Expiration Date:   07/24/2017    All questions were answered. The patient knows to call the clinic with any problems, questions or concerns. I spent 60 minutes counseling the patient face to face. The total time spent in the appointment was 80 minutes and more than 50% was on counseling.     Heath Lark, MD 07/23/2016 9:53 PM

## 2016-07-23 NOTE — Assessment & Plan Note (Signed)
She has strong family history of cancer I recommend consideration for genetic counseling

## 2016-07-23 NOTE — Assessment & Plan Note (Signed)
She appears to be doing well and has never received blood transfusion I will repeat iron studies on her return visit

## 2016-07-23 NOTE — Assessment & Plan Note (Signed)
The patient is morbidly obese Certainly, the dosing of the chemotherapy could be challenging with potential excessive risk of pancytopenia However, given her young age, we will proceed with initial treatment without growth factor support in accordance to NCCN guidelines

## 2016-07-27 ENCOUNTER — Telehealth: Payer: Self-pay

## 2016-07-27 ENCOUNTER — Other Ambulatory Visit: Payer: Self-pay | Admitting: Hematology and Oncology

## 2016-07-27 DIAGNOSIS — C541 Malignant neoplasm of endometrium: Secondary | ICD-10-CM

## 2016-07-27 NOTE — Telephone Encounter (Signed)
Patient left message to call her back. Called patient back, she wants to get a port and is willing to go to [facility] to get port placed. She does not want getting a port to delay getting chemotherapy started next week. She will have peripheral IV for first treatment if needed.

## 2016-07-27 NOTE — Telephone Encounter (Signed)
I placed port order Please call [facility] to see if it can be done before chemo

## 2016-07-27 NOTE — Telephone Encounter (Signed)
Called [facility], they will call patient and schedule.

## 2016-07-29 ENCOUNTER — Other Ambulatory Visit: Payer: Self-pay | Admitting: Radiology

## 2016-07-30 ENCOUNTER — Encounter: Payer: Self-pay | Admitting: *Deleted

## 2016-07-30 NOTE — Progress Notes (Signed)
[place] Psychosocial Distress Screening Clinical Social Work  Clinical Social Work was referred by distress screening protocol.  The patient scored a 8 on the Psychosocial Distress Thermometer which indicates severe distress. Clinical Social Worker attempted to contact patient by phone to assess for distress and other psychosocial needs. CSW left voicemail to return call when convenient.  ONCBCN DISTRESS SCREENING 07/22/2016  Screening Type Initial Screening  Distress experienced in past week (1-10) 8  Practical problem type Insurance  Family Problem type Children  Emotional problem type Depression;Nervousness/Anxiety;Adjusting to illness;Isolation/feeling alone;Feeling hopeless  Spiritual/Religous concerns type Facing my mortality;Loss of sense of purpose  Information Concerns Type Lack of info about diagnosis;Lack of info about treatment;Lack of info about complementary therapy choices  Physical Problem type Sleep/insomnia    Lauren Alver Sorrow, MSW, LCSW, OSW-C Clinical Social Worker [facility] [phone]

## 2016-07-31 ENCOUNTER — Other Ambulatory Visit (HOSPITAL_COMMUNITY): Payer: Managed Care, Other (non HMO)

## 2016-07-31 ENCOUNTER — Other Ambulatory Visit: Payer: Self-pay | Admitting: Hematology and Oncology

## 2016-07-31 ENCOUNTER — Ambulatory Visit (HOSPITAL_COMMUNITY)
Admission: RE | Admit: 2016-07-31 | Discharge: 2016-07-31 | Disposition: A | Discharge status: Home / Self Care | Payer: Managed Care, Other (non HMO) | Source: Ambulatory Visit | Attending: Hematology and Oncology | Admitting: Hematology and Oncology

## 2016-07-31 ENCOUNTER — Encounter (HOSPITAL_COMMUNITY): Payer: Self-pay

## 2016-07-31 DIAGNOSIS — C541 Malignant neoplasm of endometrium: Secondary | ICD-10-CM | POA: Diagnosis not present

## 2016-07-31 DIAGNOSIS — I10 Essential (primary) hypertension: Secondary | ICD-10-CM | POA: Insufficient documentation

## 2016-07-31 DIAGNOSIS — F419 Anxiety disorder, unspecified: Secondary | ICD-10-CM | POA: Diagnosis not present

## 2016-07-31 HISTORY — PX: IR FLUORO GUIDE PORT INSERTION RIGHT: IMG5741

## 2016-07-31 HISTORY — PX: IR US GUIDE VASC ACCESS RIGHT: IMG2390

## 2016-07-31 LAB — BASIC METABOLIC PANEL
ANION GAP: 8 (ref 5–15)
BUN: 10 mg/dL (ref 6–20)
CALCIUM: 8.9 mg/dL (ref 8.9–10.3)
CO2: 25 mmol/L (ref 22–32)
Chloride: 105 mmol/L (ref 101–111)
Creatinine, Ser: 0.66 mg/dL (ref 0.44–1.00)
GFR calc Af Amer: >60 mL/min (ref >60)
Glucose, Bld: 99 mg/dL (ref 65–99)
POTASSIUM: 4.1 mmol/L (ref 3.5–5.1)
Sodium: 138 mmol/L (ref 135–145)

## 2016-07-31 LAB — APTT: APTT: 30 s (ref 24–36)

## 2016-07-31 LAB — CBC
HEMATOCRIT: 40.5 % (ref 36.0–46.0)
HEMOGLOBIN: 12.3 g/dL (ref 12.0–15.0)
MCH: 25.3 pg — ABNORMAL LOW (ref 26.0–34.0)
MCHC: 30.4 g/dL (ref 30.0–36.0)
MCV: 83.2 fL (ref 78.0–100.0)
Platelets: 307 10*3/uL (ref 150–400)
RBC: 4.87 MIL/uL (ref 3.87–5.11)
RDW: 19.4 % — AB (ref 11.5–15.5)
WBC: 9.4 10*3/uL (ref 4.0–10.5)

## 2016-07-31 LAB — PROTIME-INR
INR: 1
PROTHROMBIN TIME: 13.2 s (ref 11.4–15.2)

## 2016-07-31 MED ORDER — HEPARIN SOD (PORK) LOCK FLUSH 100 UNIT/ML IV SOLN
INTRAVENOUS | Status: AC
  Filled 2016-07-31: qty 5

## 2016-07-31 MED ORDER — MIDAZOLAM HCL 2 MG/2ML IJ SOLN
INTRAMUSCULAR | Status: AC
  Filled 2016-07-31: qty 6

## 2016-07-31 MED ORDER — LIDOCAINE HCL (PF) 1 % IJ SOLN
INTRAMUSCULAR | Status: AC
  Filled 2016-07-31: qty 30

## 2016-07-31 MED ORDER — CEFAZOLIN SODIUM-DEXTROSE 2-4 GM/100ML-% IV SOLN
2.0000 g | INTRAVENOUS | Status: AC
  Administered 2016-07-31: 2 g via INTRAVENOUS

## 2016-07-31 MED ORDER — FENTANYL CITRATE (PF) 100 MCG/2ML IJ SOLN
INTRAMUSCULAR | Status: AC
  Filled 2016-07-31: qty 4

## 2016-07-31 MED ORDER — HEPARIN SOD (PORK) LOCK FLUSH 100 UNIT/ML IV SOLN
INTRAVENOUS | Status: AC | PRN
  Administered 2016-07-31: 500 [IU] via INTRAVENOUS

## 2016-07-31 MED ORDER — FENTANYL CITRATE (PF) 100 MCG/2ML IJ SOLN
INTRAMUSCULAR | Status: AC | PRN
  Administered 2016-07-31 (×2): 50 ug via INTRAVENOUS

## 2016-07-31 MED ORDER — MIDAZOLAM HCL 2 MG/2ML IJ SOLN
INTRAMUSCULAR | Status: AC | PRN
  Administered 2016-07-31 (×2): 1 mg via INTRAVENOUS

## 2016-07-31 MED ORDER — CEFAZOLIN SODIUM-DEXTROSE 2-4 GM/100ML-% IV SOLN
INTRAVENOUS | Status: AC
  Administered 2016-07-31: 2 g via INTRAVENOUS
  Filled 2016-07-31: qty 100

## 2016-07-31 MED ORDER — LIDOCAINE HCL (PF) 1 % IJ SOLN
INTRAMUSCULAR | Status: AC | PRN
  Administered 2016-07-31: 10 mL

## 2016-07-31 MED ORDER — SODIUM CHLORIDE 0.9 % IV SOLN: INTRAVENOUS | Status: DC

## 2016-07-31 NOTE — Sedation Documentation (Signed)
Patient is resting comfortably. 

## 2016-07-31 NOTE — Discharge Instructions (Signed)
Implanted Port Insertion, Care After °This sheet gives you information about how to care for yourself after your procedure. Your health care provider may also give you more specific instructions. If you have problems or questions, contact your health care provider. °What can I expect after the procedure? °After your procedure, it is common to have: °· Discomfort at the port insertion site. °· Bruising on the skin over the port. This should improve over 3-4 days. ° °Follow these instructions at home: °Port care °· After your port is placed, you will get a manufacturer's information card. The card has information about your port. Keep this card with you at all times. °· Take care of the port as told by your health care provider. Ask your health care provider if you or a family member can get training for taking care of the port at home. A home health care nurse may also take care of the port. °· Make sure to remember what type of port you have. °Incision care °· Follow instructions from your health care provider about how to take care of your port insertion site. Make sure you: °? Wash your hands with soap and water before you change your bandage (dressing). If soap and water are not available, use hand sanitizer. °? Change your dressing as told by your health care provider. °? Leave stitches (sutures), skin glue, or adhesive strips in place. These skin closures may need to stay in place for 2 weeks or longer. If adhesive strip edges start to loosen and curl up, you may trim the loose edges. Do not remove adhesive strips completely unless your health care provider tells you to do that. °· Check your port insertion site every day for signs of infection. Check for: °? More redness, swelling, or pain. °? More fluid or blood. °? Warmth. °? Pus or a bad smell. °General instructions °· Do not take baths, swim, or use a hot tub until your health care provider approves. °· Do not lift anything that is heavier than 10 lb (4.5  kg) for a week, or as told by your health care provider. °· Ask your health care provider when it is okay to: °? Return to work or school. °? Resume usual physical activities or sports. °· Do not drive for 24 hours if you were given a medicine to help you relax (sedative). °· Take over-the-counter and prescription medicines only as told by your health care provider. °· Wear a medical alert bracelet in case of an emergency. This will tell any health care providers that you have a port. °· Keep all follow-up visits as told by your health care provider. This is important. °Contact a health care provider if: °· You cannot flush your port with saline as directed, or you cannot draw blood from the port. °· You have a fever or chills. °· You have more redness, swelling, or pain around your port insertion site. °· You have more fluid or blood coming from your port insertion site. °· Your port insertion site feels warm to the touch. °· You have pus or a bad smell coming from the port insertion site. °Get help right away if: °· You have chest pain or shortness of breath. °· You have bleeding from your port that you cannot control. °Summary °· Take care of the port as told by your health care provider. °· Change your dressing as told by your health care provider. °· Keep all follow-up visits as told by your health care provider. °  This information is not intended to replace advice given to you by your health care provider. Make sure you discuss any questions you have with your health care provider. °Document Released: 10/19/2012 Document Revised: 11/20/2015 Document Reviewed: 11/20/2015 °Elsevier Interactive Patient Education © 2017 [employer] ° °

## 2016-07-31 NOTE — H&P (Signed)
Chief Complaint: Patient was seen in consultation today for port a cath placement at the request of [place]  Referring Physician(s): Heath Lark  Supervising Physician: Jacqulynn Cadet  Patient Status: [facility] - Out-pt  History of Present Illness: Misty [name] is a 56 y.o. female   Endometrial cancer  Dx 06/2016 Followed by Dr Alvy Bimler Scheduled to begin chemotherapy Tues of next week Request for [facility] a Cath placement  Past Medical History:  Diagnosis Date  . Anxiety   . Endometrial cancer ([place])   . Hypertension     Past Surgical History:  Procedure Laterality Date  . CHOLECYSTECTOMY    . LYMPH NODE BIOPSY N/A 06/25/2016   Procedure: LYMPH NODE BIOPSY;  Surgeon: Everitt Amber, MD;  Location: [facility] ORS;  Service: Gynecology;  Laterality: N/A;  . ROBOTIC ASSISTED TOTAL HYSTERECTOMY WITH BILATERAL SALPINGO OOPHERECTOMY Bilateral 06/25/2016   Procedure: XI ROBOTIC ASSISTED TOTAL HYSTERECTOMY WITH BILATERAL SALPINGO OOPHORECTOMY for uterus greater 250 grams ;  Surgeon: Everitt Amber, MD;  Location: [facility];  Service: Gynecology;  Laterality: Bilateral;  . TONSILLECTOMY      Allergies: Patient has no known allergies.  Medications: Prior to Admission medications   Medication Sig Start Date End Date Taking? Authorizing Provider  ALPRAZolam (XANAX) 0.5 MG tablet Take 0.25-0.5 mg by mouth daily as needed for anxiety (FOR PANIC/ANXIETY).    Yes [provider]  busPIRone (BUSPAR) 15 MG tablet Take 7.5 mg by mouth daily.    Yes [provider]  escitalopram (LEXAPRO) 20 MG tablet Take 10 mg by mouth daily.    Yes [provider]     Family History  Problem Relation Age of Onset  . Breast cancer Sister   . Cancer Paternal Grandfather   . Breast cancer Sister   . Thyroid cancer Sister     Social History   Social History  . Marital status: Married    Spouse name: [name]  . Number of children: 2  . Years of education: N/A   Occupational History    . retired    Social History Main Topics  . Smoking status: Never Smoker  . Smokeless tobacco: Never Used  . Alcohol use 0.6 oz/week    1 Glasses of wine per week     Comment: Occas  . Drug use: No  . Sexual activity: Not Asked   Other Topics Concern  . None   Social History Narrative  . None    Review of Systems: A 12 point ROS discussed and pertinent positives are indicated in the HPI above.  All other systems are negative.  Review of Systems  Constitutional: Negative for activity change, appetite change, fatigue and fever.  Respiratory: Negative for cough and shortness of breath.   Cardiovascular: Negative for chest pain.  Gastrointestinal: Negative for abdominal pain.  Psychiatric/Behavioral: Negative for behavioral problems and confusion.    Vital Signs: BP (!) 188/108   Pulse 64   Temp 98.1 F (36.7 C)   Resp 20   Ht 5\' 4"  (1.626 m)   Wt 300 lb (136.1 kg)   LMP 05/31/2016 (Approximate)   SpO2 100%   BMI 51.49 kg/m   Physical Exam  Constitutional: She is oriented to person, place, and time.  Cardiovascular: Normal rate, regular rhythm and normal heart sounds.   Pulmonary/Chest: Effort normal and breath sounds normal.  Abdominal: Soft. Bowel sounds are normal.  Musculoskeletal: Normal range of motion.  Neurological: She is alert and oriented to person, place, and time.  Skin: Skin is warm and dry.  Psychiatric: She has a normal mood and affect. Her behavior is normal. Judgment and thought content normal.  Nursing note and vitals reviewed.   Mallampati Score:  MD Evaluation Airway: WNL Heart: WNL Abdomen: WNL Chest/ Lungs: WNL ASA  Classification: 3 Mallampati/Airway Score: Two  Imaging: Ct Chest W Contrast  Result Date: 07/08/2016 CLINICAL DATA:  Staging endometrial cancer. Recent hysterectomy and bilateral oophorectomy. EXAM: CT CHEST, ABDOMEN, AND PELVIS WITH CONTRAST TECHNIQUE: Multidetector CT imaging of the chest, abdomen and pelvis was  performed following the standard protocol during bolus administration of intravenous contrast. CONTRAST:  111mL ISOVUE-300 IOPAMIDOL (ISOVUE-300) INJECTION 61% COMPARISON:  None. FINDINGS: CT CHEST FINDINGS Cardiovascular: The heart is normal in size. No pericardial effusion. Minimal scattered atherosclerotic calcifications involving the aorta but no aneurysm or dissection. Scattered coronary artery calcifications are noted. Mediastinum/Nodes: No mediastinal or hilar mass or lymphadenopathy. The esophagus is grossly normal. There is a moderate to large hiatal hernia. Lungs/Pleura: No acute pulmonary findings. No worrisome pulmonary nodules to suggest metastatic disease. Minimal areas of subpleural atelectasis. No pleural effusion or pleural lesions. Chest wall/ Musculoskeletal: No breast masses, supraclavicular or axillary lymphadenopathy. The thyroid gland appears normal. No significant bony findings. Moderate degenerative changes involving the lower thoracic spine. CT ABDOMEN PELVIS FINDINGS Hepatobiliary: No focal hepatic lesions or intrahepatic biliary dilatation. The gallbladder surgically absent. No common bile duct dilatation. Pancreas: No mass, inflammation or ductal dilatation. Spleen: Normal size.  No focal lesions. Adrenals/Urinary Tract: The adrenal glands and kidneys are unremarkable. No renal lesions, renal calculi or obstructing ureteral calculi. The bladder is decompressed but appears normal. Stomach/Bowel: The stomach, duodenum, small bowel and colon are unremarkable. No acute inflammatory changes, mass lesions or obstructive findings. The terminal ileum is normal. The appendix is normal. Vascular/Lymphatic: Moderate atherosclerotic calcifications involving the aorta and iliac arteries. No aneurysm or dissection. The branch vessels are patent. Calcifications noted at the renal artery ostia. A few small scattered mesenteric and retroperitoneal lymph nodes but no mass or overt adenopathy. In the  pelvis there are small left-sided external iliac region lymph nodes, none measuring more than 6 mm but still somewhat worrisome. Some of these appear to be enhancing. There is a 6 mm node on image 107, 6 mm node on image 110 and 5 mm node on image 115. On the right side there is a 6 mm obturator lymph node on image number 117. Reproductive: Surgically absent. Other: Expected postoperative changes in the pelvis. No worrisome postop fluid collections or hematoma. No abscess. There is a large right inguinal hernia containing fat, fluid and calcifications. Small periumbilical abdominal wall hernia containing fat Musculoskeletal: No significant bony findings. IMPRESSION: 1. Status post hysterectomy and bilateral salpingo-oophorectomy. No postoperative complicating features such as abnormal fluid collection or hematoma. 2. Small bilateral pelvic lymph nodes are indeterminate but recommend surveillance. 3. No findings for metastatic disease involving the chest or abdomen. 4. Moderate to large hiatal hernia. 5. Age advanced atherosclerotic calcifications involving the aorta and iliac arteries. Electronically Signed   By: Marijo Sanes M.D.   On: 07/08/2016 12:03   Ct Abdomen Pelvis W Contrast  Result Date: 07/08/2016 CLINICAL DATA:  Staging endometrial cancer. Recent hysterectomy and bilateral oophorectomy. EXAM: CT CHEST, ABDOMEN, AND PELVIS WITH CONTRAST TECHNIQUE: Multidetector CT imaging of the chest, abdomen and pelvis was performed following the standard protocol during bolus administration of intravenous contrast. CONTRAST:  126mL ISOVUE-300 IOPAMIDOL (ISOVUE-300) INJECTION 61% COMPARISON:  None. FINDINGS: CT CHEST FINDINGS  Cardiovascular: The heart is normal in size. No pericardial effusion. Minimal scattered atherosclerotic calcifications involving the aorta but no aneurysm or dissection. Scattered coronary artery calcifications are noted. Mediastinum/Nodes: No mediastinal or hilar mass or lymphadenopathy. The  esophagus is grossly normal. There is a moderate to large hiatal hernia. Lungs/Pleura: No acute pulmonary findings. No worrisome pulmonary nodules to suggest metastatic disease. Minimal areas of subpleural atelectasis. No pleural effusion or pleural lesions. Chest wall/ Musculoskeletal: No breast masses, supraclavicular or axillary lymphadenopathy. The thyroid gland appears normal. No significant bony findings. Moderate degenerative changes involving the lower thoracic spine. CT ABDOMEN PELVIS FINDINGS Hepatobiliary: No focal hepatic lesions or intrahepatic biliary dilatation. The gallbladder surgically absent. No common bile duct dilatation. Pancreas: No mass, inflammation or ductal dilatation. Spleen: Normal size.  No focal lesions. Adrenals/Urinary Tract: The adrenal glands and kidneys are unremarkable. No renal lesions, renal calculi or obstructing ureteral calculi. The bladder is decompressed but appears normal. Stomach/Bowel: The stomach, duodenum, small bowel and colon are unremarkable. No acute inflammatory changes, mass lesions or obstructive findings. The terminal ileum is normal. The appendix is normal. Vascular/Lymphatic: Moderate atherosclerotic calcifications involving the aorta and iliac arteries. No aneurysm or dissection. The branch vessels are patent. Calcifications noted at the renal artery ostia. A few small scattered mesenteric and retroperitoneal lymph nodes but no mass or overt adenopathy. In the pelvis there are small left-sided external iliac region lymph nodes, none measuring more than 6 mm but still somewhat worrisome. Some of these appear to be enhancing. There is a 6 mm node on image 107, 6 mm node on image 110 and 5 mm node on image 115. On the right side there is a 6 mm obturator lymph node on image number 117. Reproductive: Surgically absent. Other: Expected postoperative changes in the pelvis. No worrisome postop fluid collections or hematoma. No abscess. There is a large right  inguinal hernia containing fat, fluid and calcifications. Small periumbilical abdominal wall hernia containing fat Musculoskeletal: No significant bony findings. IMPRESSION: 1. Status post hysterectomy and bilateral salpingo-oophorectomy. No postoperative complicating features such as abnormal fluid collection or hematoma. 2. Small bilateral pelvic lymph nodes are indeterminate but recommend surveillance. 3. No findings for metastatic disease involving the chest or abdomen. 4. Moderate to large hiatal hernia. 5. Age advanced atherosclerotic calcifications involving the aorta and iliac arteries. Electronically Signed   By: Marijo Sanes M.D.   On: 07/08/2016 12:03    Labs:  CBC:  Recent Labs  05/19/16 2017 06/23/16 1422 06/26/16 0500 07/31/16 1146  WBC 10.3 10.8* 10.6* 9.4  HGB 7.2* 11.6* 10.8* 12.3  HCT 24.6* 39.1 36.2 40.5  PLT 331 302 285 307    COAGS: No results for input(s): INR, APTT in the last 8760 hours.  BMP:  Recent Labs  06/23/16 1422 06/26/16 0500  NA 142 140  K 5.6* 4.8  CL 111 105  CO2 27 28  GLUCOSE 104* 146*  BUN 11 10  CALCIUM 9.4 8.5*  CREATININE 0.63 0.73  GFRNONAA >60 >60  GFRAA >60 >60    LIVER FUNCTION TESTS:  Recent Labs  06/23/16 1422  BILITOT 0.2*  AST 16  ALT 16  ALKPHOS 53  PROT 6.7  ALBUMIN 3.8    TUMOR MARKERS: No results for input(s): AFPTM, CEA, CA199, CHROMGRNA in the last 8760 hours.  Assessment and Plan:  Endometrial Ca\to start chemo next week Scheduled for PAC placement today Risks and Benefits discussed with the patient including, but not limited to bleeding, infection, pneumothorax,  or fibrin sheath development and need for additional procedures. All of the patient's questions were answered, patient is agreeable to proceed. Consent signed and in chart.  Thank you for this interesting consult.  I greatly enjoyed meeting Curator and look forward to participating in their care.  A copy of this report was sent  to the requesting provider on this date.  Electronically Signed: Lavonia Drafts, PA-C 07/31/2016, 12:11 PM   I spent a total of  30 Minutes   in face to face in clinical consultation, greater than 50% of which was counseling/coordinating care for [facility] placement

## 2016-07-31 NOTE — Procedures (Signed)
Interventional Radiology Procedure Note  Procedure: Placement of a right IJ approach single lumen PowerPort.  Tip is positioned at the superior cavoatrial junction and catheter is ready for immediate use.  Complications: No immediate Recommendations:  - Ok to shower tomorrow - Do not submerge for 7 days - Routine line care   Signed,  Heath K. McCullough, MD   

## 2016-08-03 ENCOUNTER — Ambulatory Visit (HOSPITAL_BASED_OUTPATIENT_CLINIC_OR_DEPARTMENT_OTHER): Payer: Managed Care, Other (non HMO) | Admitting: Hematology and Oncology

## 2016-08-03 ENCOUNTER — Encounter: Payer: Self-pay | Admitting: Hematology and Oncology

## 2016-08-03 ENCOUNTER — Ambulatory Visit: Payer: Managed Care, Other (non HMO)

## 2016-08-03 ENCOUNTER — Other Ambulatory Visit (HOSPITAL_BASED_OUTPATIENT_CLINIC_OR_DEPARTMENT_OTHER): Payer: Managed Care, Other (non HMO)

## 2016-08-03 ENCOUNTER — Telehealth: Payer: Self-pay | Admitting: Hematology and Oncology

## 2016-08-03 VITALS — BP 155/76 | HR 71 | Temp 97.9°F | Resp 18 | Ht 64.0 in | Wt 304.0 lb

## 2016-08-03 DIAGNOSIS — Z809 Family history of malignant neoplasm, unspecified: Secondary | ICD-10-CM

## 2016-08-03 DIAGNOSIS — C775 Secondary and unspecified malignant neoplasm of intrapelvic lymph nodes: Secondary | ICD-10-CM | POA: Diagnosis not present

## 2016-08-03 DIAGNOSIS — C541 Malignant neoplasm of endometrium: Secondary | ICD-10-CM | POA: Diagnosis not present

## 2016-08-03 DIAGNOSIS — C569 Malignant neoplasm of unspecified ovary: Secondary | ICD-10-CM

## 2016-08-03 LAB — COMPREHENSIVE METABOLIC PANEL
ALT: 8 U/L (ref 0–55)
ANION GAP: 8 meq/L (ref 3–11)
AST: 8 U/L (ref 5–34)
Albumin: 3.4 g/dL — ABNORMAL LOW (ref 3.5–5.0)
Alkaline Phosphatase: 65 U/L (ref 40–150)
BILIRUBIN TOTAL: 0.3 mg/dL (ref 0.20–1.20)
BUN: 14.1 mg/dL (ref 7.0–26.0)
CALCIUM: 9 mg/dL (ref 8.4–10.4)
CO2: 27 meq/L (ref 22–29)
CREATININE: 0.7 mg/dL (ref 0.6–1.1)
Chloride: 105 mEq/L (ref 98–109)
Glucose: 136 mg/dl (ref 70–140)
Potassium: 4.1 mEq/L (ref 3.5–5.1)
Sodium: 140 mEq/L (ref 136–145)
TOTAL PROTEIN: 6.7 g/dL (ref 6.4–8.3)

## 2016-08-03 LAB — CBC WITH DIFFERENTIAL/PLATELET
BASO%: 0.7 % (ref 0.0–2.0)
Basophils Absolute: 0.1 10*3/uL (ref 0.0–0.1)
EOS ABS: 0.3 10*3/uL (ref 0.0–0.5)
EOS%: 2.7 % (ref 0.0–7.0)
HEMATOCRIT: 38.8 % (ref 34.8–46.6)
HGB: 12.4 g/dL (ref 11.6–15.9)
LYMPH#: 1.8 10*3/uL (ref 0.9–3.3)
LYMPH%: 18.5 % (ref 14.0–49.7)
MCH: 25.8 pg (ref 25.1–34.0)
MCHC: 31.8 g/dL (ref 31.5–36.0)
MCV: 81 fL (ref 79.5–101.0)
MONO#: 0.6 10*3/uL (ref 0.1–0.9)
MONO%: 6 % (ref 0.0–14.0)
NEUT%: 72.1 % (ref 38.4–76.8)
NEUTROS ABS: 7 10*3/uL — AB (ref 1.5–6.5)
Platelets: 288 10*3/uL (ref 145–400)
RBC: 4.79 10*6/uL (ref 3.70–5.45)
RDW: 21.2 % — ABNORMAL HIGH (ref 11.2–14.5)
WBC: 9.7 10*3/uL (ref 3.9–10.3)

## 2016-08-03 MED ORDER — PROCHLORPERAZINE MALEATE 10 MG PO TABS: 10.0000 mg | ORAL_TABLET | Freq: Four times a day (QID) | ORAL | 1 refills | Status: DC | PRN

## 2016-08-03 MED ORDER — LIDOCAINE-PRILOCAINE 2.5-2.5 % EX CREA: 1.0000 "application " | TOPICAL_CREAM | CUTANEOUS | 3 refills | Status: DC | PRN

## 2016-08-03 MED ORDER — LIDOCAINE-PRILOCAINE 2.5-2.5 % EX CREA: TOPICAL_CREAM | CUTANEOUS | 3 refills | Status: DC

## 2016-08-03 MED ORDER — ONDANSETRON HCL 8 MG PO TABS: 8.0000 mg | ORAL_TABLET | Freq: Two times a day (BID) | ORAL | 1 refills | Status: DC | PRN

## 2016-08-03 MED ORDER — DEXAMETHASONE 4 MG PO TABS: ORAL_TABLET | ORAL | 1 refills | Status: DC

## 2016-08-03 NOTE — Progress Notes (Signed)
[place] OFFICE PROGRESS NOTE  Patient Care Team: Patient, No Pcp Per as PCP - General (General Practice)  SUMMARY OF ONCOLOGIC HISTORY: Oncology History   MSI instability not detected     Endometrial cancer ([place])   05/19/2016 Initial Diagnosis    The patient reports abnormal uterine bleeding for approximately 6 years (2-4 bleeding episodes per year). She had a particularly heavy bleeding episode on 05/21/16 and was seen in the ED. Hb was 7.2g/dL. She was started on Megace. Her bleeding improved.       05/29/2016 Imaging    [place] on 05/29/16 showed a uterus with 9x6x5.7cm dimensions and an endometrial stripe of 33m.       06/05/2016 Procedure    Endometrial pipelle biopsy on 06/05/16 showed a grade 2 endometrial cancer       06/25/2016 Pathology Results    1. Lymph node, sentinel, biopsy, right external iliac METASTATIC ADENOCARCINOMA IN ONE OF ONE LYMPH NODE (1/1) 2. Lymph node, sentinel, biopsy, right obturator METASTATIC ADENOCARCINOMA IN FOUR OF FOUR LYMPH NODES (4/4) 3. Uterus +/- tubes/ovaries, neoplastic, cervix ENDOMETRIOID ADENOCARCINOMA, FIGO GRADE 3, THE TUMOR INVADES OUTER HALF OF THE MYOMETRIUM (PT1B) ALL MARGINS OF RESECTION ARE NEGATIVE FOR TUMOR BENIGN BILATERAL OVARIES AND FALLOPIAN TUBES Microscopic Comment 3. ONCOLOGY TABLE-UTERUS, CARCINOMA OR CARCINOSARCOMA  Specimen: Uterus, bilateral fallopian tubes and ovaries Procedure: Hysterectomy bilateral salpingo-oophorectomy Lymph node sampling performed: Yes Specimen integrity: Intact Maximum tumor size: 6.2 cm Histologic type: Endometrioid adenocarcinoma Grade: 3 Myometrial invasion: 1.5 cm where myometrium is 2.6 cm in thickness Cervical stromal involvement: Negative Extent of involvement of other organs: Negative Lymph - vascular invasion: Identified Peritoneal washings: Not performed Lymph nodes: Examined: 5 Sentinel 0 Non-sentinel 5 Total Lymph nodes with metastasis: 5 Isolated tumor  cells (< 0.2 mm): 0 Micrometastasis: (> 0.2 mm and < 2.0 mm): 2 Macrometastasis: (> 2.0 mm): 3 Extracapsular extension: 1 Pelvic lymph nodes: 5 involved of 5 lymph nodes. Para-aortic lymph nodes: NA involved of NA lymph nodes. Other (specify involvement and site): NA TNM code: pT1b, pN1 FIGO Stage (based on pathologic findings, needs clinical correlation): IIIC1       06/25/2016 Surgery    Surgeon: RDonaciano Eva  Operation: Robotic-assisted laparoscopic total hysterectomy uterus >250gm with bilateral salpingoophorectomy, SLN biopsy  Operative Findings:  : 12cm uterus, normal tubes and ovaries. Large volume visceral adiposity limiting exposure and visualization and increasing the duration of the procedure.        07/08/2016 Imaging    CT scan of chest, abdomen and pelvis 1. Status post hysterectomy and bilateral salpingo-oophorectomy. No postoperative complicating features such as abnormal fluid collection or hematoma. 2. Small bilateral pelvic lymph nodes are indeterminate but recommend surveillance. 3. No findings for metastatic disease involving the chest or abdomen. 4. Moderate to large hiatal hernia. 5. Age advanced atherosclerotic calcifications involving the aorta and iliac arteries.      07/31/2016 Procedure    Successful placement of a right IJ approach Power Port with ultrasound and fluoroscopic guidance. The catheter is ready for use       INTERVAL HISTORY: Please see below for problem oriented charting. She returns with her husband for chemotherapy consent She complained of minor bruising after port placement recently She had no other issues  REVIEW OF SYSTEMS:   Constitutional: Denies fevers, chills or abnormal weight loss Eyes: Denies blurriness of vision Ears, nose, mouth, throat, and face: Denies mucositis or sore throat Respiratory: Denies cough, dyspnea or wheezes Cardiovascular: Denies  palpitation, chest discomfort or lower extremity  swelling Gastrointestinal:  Denies nausea, heartburn or change in bowel habits Skin: Denies abnormal skin rashes Lymphatics: Denies new lymphadenopathy or easy bruising Neurological:Denies numbness, tingling or new weaknesses Behavioral/Psych: Mood is stable, no new changes  All other systems were reviewed with the patient and are negative.  I have reviewed the past medical history, past surgical history, social history and family history with the patient and they are unchanged from previous note.  ALLERGIES:  has No Known Allergies.  MEDICATIONS:  Current Outpatient Prescriptions  Medication Sig Dispense Refill  . ALPRAZolam (XANAX) 0.5 MG tablet Take 0.25-0.5 mg by mouth daily as needed for anxiety (FOR PANIC/ANXIETY).     . busPIRone (BUSPAR) 15 MG tablet Take 7.5 mg by mouth daily.     Marland Kitchen dexamethasone (DECADRON) 4 MG tablet Take 5 tablets the day before chemo and 5 tablets at 6 am the day of chemo with food, every 3 weeks 30 tablet 1  . escitalopram (LEXAPRO) 20 MG tablet Take 10 mg by mouth daily.     Marland Kitchen lidocaine-prilocaine (EMLA) cream Apply 1 application topically as needed. 30 g 3  . lidocaine-prilocaine (EMLA) cream Apply to affected area once 30 g 3  . ondansetron (ZOFRAN) 8 MG tablet Take 1 tablet (8 mg total) by mouth 2 (two) times daily as needed for refractory nausea / vomiting. Start on day 3 after chemo. 30 tablet 1  . prochlorperazine (COMPAZINE) 10 MG tablet Take 1 tablet (10 mg total) by mouth every 6 (six) hours as needed (Nausea or vomiting). 30 tablet 1   No current facility-administered medications for this visit.     PHYSICAL EXAMINATION: ECOG PERFORMANCE STATUS: 1 - Symptomatic but completely ambulatory  Vitals:   08/03/16 1355  BP: (!) 155/76  Pulse: 71  Resp: 18  Temp: 97.9 F (36.6 C)   Filed Weights   08/03/16 1355  Weight: (!) 304 lb (137.9 kg)    GENERAL:alert, no distress and comfortable.  She is morbidly obese SKIN: Noted minus skin  bruising EYES: normal, Conjunctiva are pink and non-injected, sclera clear OROPHARYNX:no exudate, no erythema and lips, buccal mucosa, and tongue normal  NECK: supple, thyroid normal size, non-tender, without nodularity LYMPH:  no palpable lymphadenopathy in the cervical, axillary or inguinal LUNGS: clear to auscultation and percussion with normal breathing effort HEART: regular rate & rhythm and no murmurs and no lower extremity edema ABDOMEN:abdomen soft, non-tender and normal bowel sounds Musculoskeletal:no cyanosis of digits and no clubbing  NEURO: alert & oriented x 3 with fluent speech, no focal motor/sensory deficits  LABORATORY DATA:  I have reviewed the data as listed    Component Value Date/Time   NA 140 08/03/2016 1336   K 4.1 08/03/2016 1336   CL 105 07/31/2016 1146   CO2 27 08/03/2016 1336   GLUCOSE 136 08/03/2016 1336   BUN 14.1 08/03/2016 1336   CREATININE 0.7 08/03/2016 1336   CALCIUM 9.0 08/03/2016 1336   PROT 6.7 08/03/2016 1336   ALBUMIN 3.4 (L) 08/03/2016 1336   AST 8 08/03/2016 1336   ALT 8 08/03/2016 1336   ALKPHOS 65 08/03/2016 1336   BILITOT 0.30 08/03/2016 1336   GFRNONAA >60 07/31/2016 1146   GFRAA >60 07/31/2016 1146    No results found for: SPEP, UPEP  Lab Results  Component Value Date   WBC 9.7 08/03/2016   NEUTROABS 7.0 (H) 08/03/2016   HGB 12.4 08/03/2016   HCT 38.8 08/03/2016   MCV 81.0  08/03/2016   PLT 288 08/03/2016      Chemistry      Component Value Date/Time   NA 140 08/03/2016 1336   K 4.1 08/03/2016 1336   CL 105 07/31/2016 1146   CO2 27 08/03/2016 1336   BUN 14.1 08/03/2016 1336   CREATININE 0.7 08/03/2016 1336      Component Value Date/Time   CALCIUM 9.0 08/03/2016 1336   ALKPHOS 65 08/03/2016 1336   AST 8 08/03/2016 1336   ALT 8 08/03/2016 1336   BILITOT 0.30 08/03/2016 1336       RADIOGRAPHIC STUDIES: I have personally reviewed the radiological images as listed and agreed with the findings in the report. Ct  Chest W Contrast  Result Date: 07/08/2016 CLINICAL DATA:  Staging endometrial cancer. Recent hysterectomy and bilateral oophorectomy. EXAM: CT CHEST, ABDOMEN, AND PELVIS WITH CONTRAST TECHNIQUE: Multidetector CT imaging of the chest, abdomen and pelvis was performed following the standard protocol during bolus administration of intravenous contrast. CONTRAST:  187m ISOVUE-300 IOPAMIDOL (ISOVUE-300) INJECTION 61% COMPARISON:  None. FINDINGS: CT CHEST FINDINGS Cardiovascular: The heart is normal in size. No pericardial effusion. Minimal scattered atherosclerotic calcifications involving the aorta but no aneurysm or dissection. Scattered coronary artery calcifications are noted. Mediastinum/Nodes: No mediastinal or hilar mass or lymphadenopathy. The esophagus is grossly normal. There is a moderate to large hiatal hernia. Lungs/Pleura: No acute pulmonary findings. No worrisome pulmonary nodules to suggest metastatic disease. Minimal areas of subpleural atelectasis. No pleural effusion or pleural lesions. Chest wall/ Musculoskeletal: No breast masses, supraclavicular or axillary lymphadenopathy. The thyroid gland appears normal. No significant bony findings. Moderate degenerative changes involving the lower thoracic spine. CT ABDOMEN PELVIS FINDINGS Hepatobiliary: No focal hepatic lesions or intrahepatic biliary dilatation. The gallbladder surgically absent. No common bile duct dilatation. Pancreas: No mass, inflammation or ductal dilatation. Spleen: Normal size.  No focal lesions. Adrenals/Urinary Tract: The adrenal glands and kidneys are unremarkable. No renal lesions, renal calculi or obstructing ureteral calculi. The bladder is decompressed but appears normal. Stomach/Bowel: The stomach, duodenum, small bowel and colon are unremarkable. No acute inflammatory changes, mass lesions or obstructive findings. The terminal ileum is normal. The appendix is normal. Vascular/Lymphatic: Moderate atherosclerotic  calcifications involving the aorta and iliac arteries. No aneurysm or dissection. The branch vessels are patent. Calcifications noted at the renal artery ostia. A few small scattered mesenteric and retroperitoneal lymph nodes but no mass or overt adenopathy. In the pelvis there are small left-sided external iliac region lymph nodes, none measuring more than 6 mm but still somewhat worrisome. Some of these appear to be enhancing. There is a 6 mm node on image 107, 6 mm node on image 110 and 5 mm node on image 115. On the right side there is a 6 mm obturator lymph node on image number 117. Reproductive: Surgically absent. Other: Expected postoperative changes in the pelvis. No worrisome postop fluid collections or hematoma. No abscess. There is a large right inguinal hernia containing fat, fluid and calcifications. Small periumbilical abdominal wall hernia containing fat Musculoskeletal: No significant bony findings. IMPRESSION: 1. Status post hysterectomy and bilateral salpingo-oophorectomy. No postoperative complicating features such as abnormal fluid collection or hematoma. 2. Small bilateral pelvic lymph nodes are indeterminate but recommend surveillance. 3. No findings for metastatic disease involving the chest or abdomen. 4. Moderate to large hiatal hernia. 5. Age advanced atherosclerotic calcifications involving the aorta and iliac arteries. Electronically Signed   By: PMarijo SanesM.D.   On: 07/08/2016 12:03   Ct  Abdomen Pelvis W Contrast  Result Date: 07/08/2016 CLINICAL DATA:  Staging endometrial cancer. Recent hysterectomy and bilateral oophorectomy. EXAM: CT CHEST, ABDOMEN, AND PELVIS WITH CONTRAST TECHNIQUE: Multidetector CT imaging of the chest, abdomen and pelvis was performed following the standard protocol during bolus administration of intravenous contrast. CONTRAST:  165m ISOVUE-300 IOPAMIDOL (ISOVUE-300) INJECTION 61% COMPARISON:  None. FINDINGS: CT CHEST FINDINGS Cardiovascular: The heart is  normal in size. No pericardial effusion. Minimal scattered atherosclerotic calcifications involving the aorta but no aneurysm or dissection. Scattered coronary artery calcifications are noted. Mediastinum/Nodes: No mediastinal or hilar mass or lymphadenopathy. The esophagus is grossly normal. There is a moderate to large hiatal hernia. Lungs/Pleura: No acute pulmonary findings. No worrisome pulmonary nodules to suggest metastatic disease. Minimal areas of subpleural atelectasis. No pleural effusion or pleural lesions. Chest wall/ Musculoskeletal: No breast masses, supraclavicular or axillary lymphadenopathy. The thyroid gland appears normal. No significant bony findings. Moderate degenerative changes involving the lower thoracic spine. CT ABDOMEN PELVIS FINDINGS Hepatobiliary: No focal hepatic lesions or intrahepatic biliary dilatation. The gallbladder surgically absent. No common bile duct dilatation. Pancreas: No mass, inflammation or ductal dilatation. Spleen: Normal size.  No focal lesions. Adrenals/Urinary Tract: The adrenal glands and kidneys are unremarkable. No renal lesions, renal calculi or obstructing ureteral calculi. The bladder is decompressed but appears normal. Stomach/Bowel: The stomach, duodenum, small bowel and colon are unremarkable. No acute inflammatory changes, mass lesions or obstructive findings. The terminal ileum is normal. The appendix is normal. Vascular/Lymphatic: Moderate atherosclerotic calcifications involving the aorta and iliac arteries. No aneurysm or dissection. The branch vessels are patent. Calcifications noted at the renal artery ostia. A few small scattered mesenteric and retroperitoneal lymph nodes but no mass or overt adenopathy. In the pelvis there are small left-sided external iliac region lymph nodes, none measuring more than 6 mm but still somewhat worrisome. Some of these appear to be enhancing. There is a 6 mm node on image 107, 6 mm node on image 110 and 5 mm node on  image 115. On the right side there is a 6 mm obturator lymph node on image number 117. Reproductive: Surgically absent. Other: Expected postoperative changes in the pelvis. No worrisome postop fluid collections or hematoma. No abscess. There is a large right inguinal hernia containing fat, fluid and calcifications. Small periumbilical abdominal wall hernia containing fat Musculoskeletal: No significant bony findings. IMPRESSION: 1. Status post hysterectomy and bilateral salpingo-oophorectomy. No postoperative complicating features such as abnormal fluid collection or hematoma. 2. Small bilateral pelvic lymph nodes are indeterminate but recommend surveillance. 3. No findings for metastatic disease involving the chest or abdomen. 4. Moderate to large hiatal hernia. 5. Age advanced atherosclerotic calcifications involving the aorta and iliac arteries. Electronically Signed   By: PMarijo SanesM.D.   On: 07/08/2016 12:03   Ir [place] Vasc Access Right  Result Date: 07/31/2016 INDICATION: 55year old female with endometrial cancer. She requires durable venous access for chemotherapy. EXAM: IMPLANTED PORT A CATH PLACEMENT WITH ULTRASOUND AND FLUOROSCOPIC GUIDANCE MEDICATIONS: 2 g Ancef; The antibiotic was administered within an appropriate time interval prior to skin puncture. ANESTHESIA/SEDATION: Versed 2 mg IV; Fentanyl 100 mcg IV; Moderate Sedation Time:  25 minutes The patient was continuously monitored during the procedure by the interventional radiology nurse under my direct supervision. FLUOROSCOPY TIME:  0 minutes, 24 seconds (less than 1 mGy) COMPLICATIONS: None immediate. PROCEDURE: The right neck and chest was prepped with chlorhexidine, and draped in the usual sterile fashion using maximum barrier technique (cap and  mask, sterile gown, sterile gloves, large sterile sheet, hand hygiene and cutaneous antiseptic). Antibiotic prophylaxis was provided with 2g Ancef administered IV one hour prior to skin  incision. Local anesthesia was attained by infiltration with 1% lidocaine with epinephrine. Ultrasound demonstrated patency of the right internal jugular vein, and this was documented with an image. Under real-time ultrasound guidance, this vein was accessed with a 21 gauge micropuncture needle and image documentation was performed. A small dermatotomy was made at the access site with an 11 scalpel. A 0.018" wire was advanced into the SVC and the access needle exchanged for a 77F micropuncture vascular sheath. The 0.018" wire was then removed and a 0.035" wire advanced into the IVC. An appropriate location for the subcutaneous reservoir was selected below the clavicle and an incision was made through the skin and underlying soft tissues. The subcutaneous tissues were then dissected using a combination of blunt and sharp surgical technique and a pocket was formed. A single lumen power injectable portacatheter was then tunneled through the subcutaneous tissues from the pocket to the dermatotomy and the port reservoir placed within the subcutaneous pocket. The venous access site was then serially dilated and a peel away vascular sheath placed over the wire. The wire was removed and the port catheter advanced into position under fluoroscopic guidance. The catheter tip is positioned in the upper right atrium. This was documented with a spot image. The portacatheter was then tested and found to flush and aspirate well. The port was flushed with saline followed by 100 units/mL heparinized saline. The pocket was then closed in two layers using first subdermal inverted interrupted absorbable sutures followed by a running subcuticular suture. The epidermis was then sealed with Dermabond. The dermatotomy at the venous access site was also closed with a single inverted subdermal suture and the epidermis sealed with Dermabond. IMPRESSION: Successful placement of a right IJ approach Power Port with ultrasound and fluoroscopic  guidance. The catheter is ready for use. Electronically Signed   By: Jacqulynn Cadet M.D.   On: 07/31/2016 17:40   Ir Fluoro Guide Port Insertion Right  Result Date: 07/31/2016 INDICATION: 56 year old female with endometrial cancer. She requires durable venous access for chemotherapy. EXAM: IMPLANTED PORT A CATH PLACEMENT WITH ULTRASOUND AND FLUOROSCOPIC GUIDANCE MEDICATIONS: 2 g Ancef; The antibiotic was administered within an appropriate time interval prior to skin puncture. ANESTHESIA/SEDATION: Versed 2 mg IV; Fentanyl 100 mcg IV; Moderate Sedation Time:  25 minutes The patient was continuously monitored during the procedure by the interventional radiology nurse under my direct supervision. FLUOROSCOPY TIME:  0 minutes, 24 seconds (less than 1 mGy) COMPLICATIONS: None immediate. PROCEDURE: The right neck and chest was prepped with chlorhexidine, and draped in the usual sterile fashion using maximum barrier technique (cap and mask, sterile gown, sterile gloves, large sterile sheet, hand hygiene and cutaneous antiseptic). Antibiotic prophylaxis was provided with 2g Ancef administered IV one hour prior to skin incision. Local anesthesia was attained by infiltration with 1% lidocaine with epinephrine. Ultrasound demonstrated patency of the right internal jugular vein, and this was documented with an image. Under real-time ultrasound guidance, this vein was accessed with a 21 gauge micropuncture needle and image documentation was performed. A small dermatotomy was made at the access site with an 11 scalpel. A 0.018" wire was advanced into the SVC and the access needle exchanged for a 77F micropuncture vascular sheath. The 0.018" wire was then removed and a 0.035" wire advanced into the IVC. An appropriate location for the subcutaneous  reservoir was selected below the clavicle and an incision was made through the skin and underlying soft tissues. The subcutaneous tissues were then dissected using a combination of  blunt and sharp surgical technique and a pocket was formed. A single lumen power injectable portacatheter was then tunneled through the subcutaneous tissues from the pocket to the dermatotomy and the port reservoir placed within the subcutaneous pocket. The venous access site was then serially dilated and a peel away vascular sheath placed over the wire. The wire was removed and the port catheter advanced into position under fluoroscopic guidance. The catheter tip is positioned in the upper right atrium. This was documented with a spot image. The portacatheter was then tested and found to flush and aspirate well. The port was flushed with saline followed by 100 units/mL heparinized saline. The pocket was then closed in two layers using first subdermal inverted interrupted absorbable sutures followed by a running subcuticular suture. The epidermis was then sealed with Dermabond. The dermatotomy at the venous access site was also closed with a single inverted subdermal suture and the epidermis sealed with Dermabond. IMPRESSION: Successful placement of a right IJ approach Power Port with ultrasound and fluoroscopic guidance. The catheter is ready for use. Electronically Signed   By: Jacqulynn Cadet M.D.   On: 07/31/2016 17:40    ASSESSMENT & PLAN:  Endometrial cancer ([place]) We reviewed the NCCN guidelines We discussed the role of chemotherapy. The intent is of curative intent.  We discussed some of the risks, benefits, side-effects of carboplatin & Taxol. Treatment is intravenous, every 3 weeks x 6 cycles  Some of the short term side-effects included, though not limited to, including weight loss, life threatening infections, risk of allergic reactions, need for transfusions of blood products, nausea, vomiting, change in bowel habits, loss of hair, admission to hospital for various reasons, and risks of death.   Long term side-effects are also discussed including risks of infertility, permanent damage to  nerve function, hearing loss, chronic fatigue, kidney damage with possibility needing hemodialysis, and rare secondary malignancy including bone marrow disorders.  The patient is aware that the response rates discussed earlier is not guaranteed.  After a long discussion, patient made an informed decision to proceed with the prescribed plan of care.   Patient education material was dispensed. We discussed premedication with dexamethasone before chemotherapy. Due to her young age, I will omit Neulasta for cycle 1 Due to her morbid obesity, I would Carboplatin at 750 mg I will see her back prior to cycle 2 for assessment of toxicity  Family history of cancer She has strong family history of cancer I recommend consideration for genetic counseling Referral is sent   No orders of the defined types were placed in this encounter.  All questions were answered. The patient knows to call the clinic with any problems, questions or concerns. No barriers to learning was detected. I spent 25 minutes counseling the patient face to face. The total time spent in the appointment was 30 minutes and more than 50% was on counseling and review of test results     Heath Lark, MD 08/03/2016 3:17 PM

## 2016-08-03 NOTE — Telephone Encounter (Signed)
Appointments complete per 7/23 los. Gave patient avs report and appointments for July and August.

## 2016-08-03 NOTE — Assessment & Plan Note (Signed)
We reviewed the NCCN guidelines We discussed the role of chemotherapy. The intent is of curative intent.  We discussed some of the risks, benefits, side-effects of carboplatin & Taxol. Treatment is intravenous, every 3 weeks x 6 cycles  Some of the short term side-effects included, though not limited to, including weight loss, life threatening infections, risk of allergic reactions, need for transfusions of blood products, nausea, vomiting, change in bowel habits, loss of hair, admission to hospital for various reasons, and risks of death.   Long term side-effects are also discussed including risks of infertility, permanent damage to nerve function, hearing loss, chronic fatigue, kidney damage with possibility needing hemodialysis, and rare secondary malignancy including bone marrow disorders.  The patient is aware that the response rates discussed earlier is not guaranteed.  After a long discussion, patient made an informed decision to proceed with the prescribed plan of care.   Patient education material was dispensed. We discussed premedication with dexamethasone before chemotherapy. Due to her young age, I will omit Neulasta for cycle 1 Due to her morbid obesity, I would Carboplatin at 750 mg I will see her back prior to cycle 2 for assessment of toxicity

## 2016-08-03 NOTE — Assessment & Plan Note (Signed)
She has strong family history of cancer I recommend consideration for genetic counseling Referral is sent

## 2016-08-04 ENCOUNTER — Ambulatory Visit (HOSPITAL_BASED_OUTPATIENT_CLINIC_OR_DEPARTMENT_OTHER): Payer: Managed Care, Other (non HMO)

## 2016-08-04 ENCOUNTER — Telehealth: Payer: Self-pay | Admitting: Hematology and Oncology

## 2016-08-04 VITALS — BP 182/98 | HR 46 | Temp 98.6°F | Resp 20

## 2016-08-04 DIAGNOSIS — C775 Secondary and unspecified malignant neoplasm of intrapelvic lymph nodes: Secondary | ICD-10-CM

## 2016-08-04 DIAGNOSIS — Z5111 Encounter for antineoplastic chemotherapy: Secondary | ICD-10-CM

## 2016-08-04 DIAGNOSIS — C541 Malignant neoplasm of endometrium: Secondary | ICD-10-CM

## 2016-08-04 MED ORDER — DEXTROSE 5 % IV SOLN
175.0000 mg/m2 | Freq: Once | INTRAVENOUS | Status: AC
  Administered 2016-08-04: 432 mg via INTRAVENOUS
  Filled 2016-08-04: qty 72

## 2016-08-04 MED ORDER — FAMOTIDINE IN NACL 20-0.9 MG/50ML-% IV SOLN
20.0000 mg | Freq: Once | INTRAVENOUS | Status: AC
  Administered 2016-08-04: 20 mg via INTRAVENOUS

## 2016-08-04 MED ORDER — HEPARIN SOD (PORK) LOCK FLUSH 100 UNIT/ML IV SOLN
500.0000 [IU] | Freq: Once | INTRAVENOUS | Status: AC | PRN
  Administered 2016-08-04: 500 [IU]
  Filled 2016-08-04: qty 5

## 2016-08-04 MED ORDER — FAMOTIDINE IN NACL 20-0.9 MG/50ML-% IV SOLN
INTRAVENOUS | Status: AC
  Filled 2016-08-04: qty 50

## 2016-08-04 MED ORDER — SODIUM CHLORIDE 0.9 % IV SOLN
Freq: Once | INTRAVENOUS | Status: AC
  Administered 2016-08-04: 09:00:00 via INTRAVENOUS

## 2016-08-04 MED ORDER — PALONOSETRON HCL INJECTION 0.25 MG/5ML
INTRAVENOUS | Status: AC
  Filled 2016-08-04: qty 5

## 2016-08-04 MED ORDER — DIPHENHYDRAMINE HCL 50 MG/ML IJ SOLN
50.0000 mg | Freq: Once | INTRAMUSCULAR | Status: AC
  Administered 2016-08-04: 50 mg via INTRAVENOUS

## 2016-08-04 MED ORDER — SODIUM CHLORIDE 0.9% FLUSH
10.0000 mL | INTRAVENOUS | Status: DC | PRN
  Administered 2016-08-04: 10 mL
  Filled 2016-08-04: qty 10

## 2016-08-04 MED ORDER — DIPHENHYDRAMINE HCL 50 MG/ML IJ SOLN
INTRAMUSCULAR | Status: AC
  Filled 2016-08-04: qty 1

## 2016-08-04 MED ORDER — PALONOSETRON HCL INJECTION 0.25 MG/5ML
0.2500 mg | Freq: Once | INTRAVENOUS | Status: AC
  Administered 2016-08-04: 0.25 mg via INTRAVENOUS

## 2016-08-04 MED ORDER — SODIUM CHLORIDE 0.9 % IV SOLN
750.0000 mg | Freq: Once | INTRAVENOUS | Status: AC
  Administered 2016-08-04: 750 mg via INTRAVENOUS
  Filled 2016-08-04: qty 75

## 2016-08-04 MED ORDER — SODIUM CHLORIDE 0.9 % IV SOLN
Freq: Once | INTRAVENOUS | Status: AC
  Administered 2016-08-04: 09:00:00 via INTRAVENOUS
  Filled 2016-08-04: qty 5

## 2016-08-04 NOTE — Patient Instructions (Signed)
Cancer Center Discharge Instructions for Patients Receiving Chemotherapy  Today you received the following chemotherapy agents Taxol and Carboplatin   To help prevent nausea and vomiting after your treatment, we encourage you to take your nausea medication as directed. If you develop nausea and vomiting that is not controlled by your nausea medication, call the clinic.   BELOW ARE SYMPTOMS THAT SHOULD BE REPORTED IMMEDIATELY:  *FEVER GREATER THAN 100.5 F  *CHILLS WITH OR WITHOUT FEVER  NAUSEA AND VOMITING THAT IS NOT CONTROLLED WITH YOUR NAUSEA MEDICATION  *UNUSUAL SHORTNESS OF BREATH  *UNUSUAL BRUISING OR BLEEDING  TENDERNESS IN MOUTH AND THROAT WITH OR WITHOUT PRESENCE OF ULCERS  *URINARY PROBLEMS  *BOWEL PROBLEMS  UNUSUAL RASH Items with * indicate a potential emergency and should be followed up as soon as possible.  Feel free to call the clinic you have any questions or concerns. The clinic phone number is [phone].  Please show the CHEMO ALERT CARD at check-in to the Emergency Department and triage nurse.  Paclitaxel injection What is this medicine? PACLITAXEL (PAK li TAX el) is a chemotherapy drug. It targets fast dividing cells, like cancer cells, and causes these cells to die. This medicine is used to treat ovarian cancer, breast cancer, and other cancers. This medicine may be used for other purposes; ask your health care provider or pharmacist if you have questions. COMMON BRAND NAME(S): Onxol, Taxol What should I tell my health care provider before I take this medicine? They need to know if you have any of these conditions: -blood disorders -irregular heartbeat -infection (especially a virus infection such as chickenpox, cold sores, or herpes) -liver disease -previous or ongoing radiation therapy -an unusual or allergic reaction to paclitaxel, alcohol, polyoxyethylated castor oil, other chemotherapy agents, other medicines, foods, dyes, or  preservatives -pregnant or trying to get pregnant -breast-feeding How should I use this medicine? This drug is given as an infusion into a vein. It is administered in a hospital or clinic by a specially trained health care professional. Talk to your pediatrician regarding the use of this medicine in children. Special care may be needed. Overdosage: If you think you have taken too much of this medicine contact a poison control center or emergency room at once. NOTE: This medicine is only for you. Do not share this medicine with others. What if I miss a dose? It is important not to miss your dose. Call your doctor or health care professional if you are unable to keep an appointment. What may interact with this medicine? Do not take this medicine with any of the following medications: -disulfiram -metronidazole This medicine may also interact with the following medications: -cyclosporine -diazepam -ketoconazole -medicines to increase blood counts like filgrastim, pegfilgrastim, sargramostim -other chemotherapy drugs like cisplatin, doxorubicin, epirubicin, etoposide, teniposide, vincristine -quinidine -testosterone -vaccines -verapamil Talk to your doctor or health care professional before taking any of these medicines: -acetaminophen -aspirin -ibuprofen -ketoprofen -naproxen This list may not describe all possible interactions. Give your health care provider a list of all the medicines, herbs, non-prescription drugs, or dietary supplements you use. Also tell them if you smoke, drink alcohol, or use illegal drugs. Some items may interact with your medicine. What should I watch for while using this medicine? Your condition will be monitored carefully while you are receiving this medicine. You will need important blood work done while you are taking this medicine. This medicine can cause serious allergic reactions. To reduce your risk you will need to take   other medicine(s) before  treatment with this medicine. If you experience allergic reactions like skin rash, itching or hives, swelling of the face, lips, or tongue, tell your doctor or health care professional right away. In some cases, you may be given additional medicines to help with side effects. Follow all directions for their use. This drug may make you feel generally unwell. This is not uncommon, as chemotherapy can affect healthy cells as well as cancer cells. Report any side effects. Continue your course of treatment even though you feel ill unless your doctor tells you to stop. Call your doctor or health care professional for advice if you get a fever, chills or sore throat, or other symptoms of a cold or flu. Do not treat yourself. This drug decreases your body's ability to fight infections. Try to avoid being around people who are sick. This medicine may increase your risk to bruise or bleed. Call your doctor or health care professional if you notice any unusual bleeding. Be careful brushing and flossing your teeth or using a toothpick because you may get an infection or bleed more easily. If you have any dental work done, tell your dentist you are receiving this medicine. Avoid taking products that contain aspirin, acetaminophen, ibuprofen, naproxen, or ketoprofen unless instructed by your doctor. These medicines may hide a fever. Do not become pregnant while taking this medicine. Women should inform their doctor if they wish to become pregnant or think they might be pregnant. There is a potential for serious side effects to an unborn child. Talk to your health care professional or pharmacist for more information. Do not breast-feed an infant while taking this medicine. Men are advised not to father a child while receiving this medicine. This product may contain alcohol. Ask your pharmacist or healthcare provider if this medicine contains alcohol. Be sure to tell all healthcare providers you are taking this medicine.  Certain medicines, like metronidazole and disulfiram, can cause an unpleasant reaction when taken with alcohol. The reaction includes flushing, headache, nausea, vomiting, sweating, and increased thirst. The reaction can last from 30 minutes to several hours. What side effects may I notice from receiving this medicine? Side effects that you should report to your doctor or health care professional as soon as possible: -allergic reactions like skin rash, itching or hives, swelling of the face, lips, or tongue -low blood counts - This drug may decrease the number of white blood cells, red blood cells and platelets. You may be at increased risk for infections and bleeding. -signs of infection - fever or chills, cough, sore throat, pain or difficulty passing urine -signs of decreased platelets or bleeding - bruising, pinpoint red spots on the skin, black, tarry stools, nosebleeds -signs of decreased red blood cells - unusually weak or tired, fainting spells, lightheadedness -breathing problems -chest pain -high or low blood pressure -mouth sores -nausea and vomiting -pain, swelling, redness or irritation at the injection site -pain, tingling, numbness in the hands or feet -slow or irregular heartbeat -swelling of the ankle, feet, hands Side effects that usually do not require medical attention (report to your doctor or health care professional if they continue or are bothersome): -bone pain -complete hair loss including hair on your head, underarms, pubic hair, eyebrows, and eyelashes -changes in the color of fingernails -diarrhea -loosening of the fingernails -loss of appetite -muscle or joint pain -red flush to skin -sweating This list may not describe all possible side effects. Call your doctor for medical advice about side   effects. You may report side effects to FDA at 1-800-FDA-1088. Where should I keep my medicine? This drug is given in a hospital or clinic and will not be stored at  home. NOTE: This sheet is a summary. It may not cover all possible information. If you have questions about this medicine, talk to your doctor, pharmacist, or health care provider.  2018 [employer] Standard (2014-10-30 19:58:00)   Carboplatin injection What is this medicine? CARBOPLATIN (KAR boe pla tin) is a chemotherapy drug. It targets fast dividing cells, like cancer cells, and causes these cells to die. This medicine is used to treat ovarian cancer and many other cancers. This medicine may be used for other purposes; ask your health care provider or pharmacist if you have questions. COMMON BRAND NAME(S): Paraplatin What should I tell my health care provider before I take this medicine? They need to know if you have any of these conditions: -blood disorders -hearing problems -kidney disease -recent or ongoing radiation therapy -an unusual or allergic reaction to carboplatin, cisplatin, other chemotherapy, other medicines, foods, dyes, or preservatives -pregnant or trying to get pregnant -breast-feeding How should I use this medicine? This drug is usually given as an infusion into a vein. It is administered in a hospital or clinic by a specially trained health care professional. Talk to your pediatrician regarding the use of this medicine in children. Special care may be needed. Overdosage: If you think you have taken too much of this medicine contact a poison control center or emergency room at once. NOTE: This medicine is only for you. Do not share this medicine with others. What if I miss a dose? It is important not to miss a dose. Call your doctor or health care professional if you are unable to keep an appointment. What may interact with this medicine? -medicines for seizures -medicines to increase blood counts like filgrastim, pegfilgrastim, sargramostim -some antibiotics like amikacin, gentamicin, neomycin, streptomycin, tobramycin -vaccines Talk to your doctor or health  care professional before taking any of these medicines: -acetaminophen -aspirin -ibuprofen -ketoprofen -naproxen This list may not describe all possible interactions. Give your health care provider a list of all the medicines, herbs, non-prescription drugs, or dietary supplements you use. Also tell them if you smoke, drink alcohol, or use illegal drugs. Some items may interact with your medicine. What should I watch for while using this medicine? Your condition will be monitored carefully while you are receiving this medicine. You will need important blood work done while you are taking this medicine. This drug may make you feel generally unwell. This is not uncommon, as chemotherapy can affect healthy cells as well as cancer cells. Report any side effects. Continue your course of treatment even though you feel ill unless your doctor tells you to stop. In some cases, you may be given additional medicines to help with side effects. Follow all directions for their use. Call your doctor or health care professional for advice if you get a fever, chills or sore throat, or other symptoms of a cold or flu. Do not treat yourself. This drug decreases your body's ability to fight infections. Try to avoid being around people who are sick. This medicine may increase your risk to bruise or bleed. Call your doctor or health care professional if you notice any unusual bleeding. Be careful brushing and flossing your teeth or using a toothpick because you may get an infection or bleed more easily. If you have any dental work done, tell your dentist you   are receiving this medicine. Avoid taking products that contain aspirin, acetaminophen, ibuprofen, naproxen, or ketoprofen unless instructed by your doctor. These medicines may hide a fever. Do not become pregnant while taking this medicine. Women should inform their doctor if they wish to become pregnant or think they might be pregnant. There is a potential for serious  side effects to an unborn child. Talk to your health care professional or pharmacist for more information. Do not breast-feed an infant while taking this medicine. What side effects may I notice from receiving this medicine? Side effects that you should report to your doctor or health care professional as soon as possible: -allergic reactions like skin rash, itching or hives, swelling of the face, lips, or tongue -signs of infection - fever or chills, cough, sore throat, pain or difficulty passing urine -signs of decreased platelets or bleeding - bruising, pinpoint red spots on the skin, black, tarry stools, nosebleeds -signs of decreased red blood cells - unusually weak or tired, fainting spells, lightheadedness -breathing problems -changes in hearing -changes in vision -chest pain -high blood pressure -low blood counts - This drug may decrease the number of white blood cells, red blood cells and platelets. You may be at increased risk for infections and bleeding. -nausea and vomiting -pain, swelling, redness or irritation at the injection site -pain, tingling, numbness in the hands or feet -problems with balance, talking, walking -trouble passing urine or change in the amount of urine Side effects that usually do not require medical attention (report to your doctor or health care professional if they continue or are bothersome): -hair loss -loss of appetite -metallic taste in the mouth or changes in taste This list may not describe all possible side effects. Call your doctor for medical advice about side effects. You may report side effects to FDA at 1-800-FDA-1088. Where should I keep my medicine? This drug is given in a hospital or clinic and will not be stored at home. NOTE: This sheet is a summary. It may not cover all possible information. If you have questions about this medicine, talk to your doctor, pharmacist, or health care provider.  2018 [employer]/Gold Standard (2007-04-05  14:38:05)   

## 2016-08-04 NOTE — Progress Notes (Signed)
Patient experienced increased BPs before and during treatment.  Patient has history of high anxiety and took Xanax during treatment.  Dr. Alvy Bimler notified and was previously aware of anxiety issues.

## 2016-08-04 NOTE — Progress Notes (Signed)
Patient needs OPSITE dressing for port due to sensitive skin.  Cyndia Bent RN

## 2016-08-04 NOTE — Telephone Encounter (Signed)
Scheduled appt per sch message from Seth Bake - left message and sent reminder letter in the mail.

## 2016-08-05 ENCOUNTER — Telehealth: Payer: Self-pay

## 2016-08-05 NOTE — Telephone Encounter (Signed)
Called patient for chemo follow up, she said she is doing great.

## 2016-08-05 NOTE — Telephone Encounter (Signed)
-----   Message from Azzie Glatter, RN sent at 08/04/2016  3:05 PM EDT ----- Regarding: "1st time chemotherapy, per Dr. Alvy Bimler" Patient received Taxol and Carboplatin for the 1st time, per Dr. Alvy Bimler.  Tolerated treatment well with no complaints.

## 2016-08-14 ENCOUNTER — Telehealth: Payer: Self-pay | Admitting: *Deleted

## 2016-08-14 ENCOUNTER — Telehealth: Payer: Self-pay

## 2016-08-14 NOTE — Telephone Encounter (Signed)
Pt called with questions about lab appt with the genetic counselor. Answered her questions. Use the arm for access, which labs drawn will be determined by Roma Kayser.

## 2016-08-14 NOTE — Telephone Encounter (Signed)
Called patient to inform of [facility], spoke with patient and she is aware of these appts. 

## 2016-08-17 ENCOUNTER — Other Ambulatory Visit (HOSPITAL_BASED_OUTPATIENT_CLINIC_OR_DEPARTMENT_OTHER): Payer: Managed Care, Other (non HMO)

## 2016-08-17 ENCOUNTER — Encounter: Payer: Self-pay | Admitting: Genetic Counselor

## 2016-08-17 ENCOUNTER — Ambulatory Visit (HOSPITAL_BASED_OUTPATIENT_CLINIC_OR_DEPARTMENT_OTHER): Payer: Managed Care, Other (non HMO) | Admitting: Genetic Counselor

## 2016-08-17 DIAGNOSIS — Z8042 Family history of malignant neoplasm of prostate: Secondary | ICD-10-CM | POA: Diagnosis not present

## 2016-08-17 DIAGNOSIS — Z803 Family history of malignant neoplasm of breast: Secondary | ICD-10-CM

## 2016-08-17 DIAGNOSIS — Z8371 Family history of colonic polyps: Secondary | ICD-10-CM | POA: Diagnosis not present

## 2016-08-17 DIAGNOSIS — Z800 Family history of malignant neoplasm of digestive organs: Secondary | ICD-10-CM | POA: Diagnosis not present

## 2016-08-17 DIAGNOSIS — C775 Secondary and unspecified malignant neoplasm of intrapelvic lymph nodes: Secondary | ICD-10-CM

## 2016-08-17 DIAGNOSIS — Z808 Family history of malignant neoplasm of other organs or systems: Secondary | ICD-10-CM | POA: Diagnosis not present

## 2016-08-17 DIAGNOSIS — Z315 Encounter for genetic counseling: Secondary | ICD-10-CM | POA: Diagnosis not present

## 2016-08-17 DIAGNOSIS — C541 Malignant neoplasm of endometrium: Secondary | ICD-10-CM | POA: Diagnosis not present

## 2016-08-17 DIAGNOSIS — Z83719 Family history of colon polyps, unspecified: Secondary | ICD-10-CM

## 2016-08-17 LAB — CBC WITH DIFFERENTIAL/PLATELET
BASO%: 0.1 % (ref 0.0–2.0)
Basophils Absolute: 0 10*3/uL (ref 0.0–0.1)
EOS ABS: 0.1 10*3/uL (ref 0.0–0.5)
EOS%: 3.3 % (ref 0.0–7.0)
HCT: 39.5 % (ref 34.8–46.6)
HGB: 12.5 g/dL (ref 11.6–15.9)
LYMPH%: 48.5 % (ref 14.0–49.7)
MCH: 25.3 pg (ref 25.1–34.0)
MCHC: 31.7 g/dL (ref 31.5–36.0)
MCV: 79.9 fL (ref 79.5–101.0)
MONO#: 0.8 10*3/uL (ref 0.1–0.9)
MONO%: 28 % — AB (ref 0.0–14.0)
NEUT%: 20.1 % — ABNORMAL LOW (ref 38.4–76.8)
NEUTROS ABS: 0.6 10*3/uL — AB (ref 1.5–6.5)
Platelets: 256 10*3/uL (ref 145–400)
RBC: 4.94 10*6/uL (ref 3.70–5.45)
RDW: 19.2 % — ABNORMAL HIGH (ref 11.2–14.5)
WBC: 2.9 10*3/uL — AB (ref 3.9–10.3)
lymph#: 1.4 10*3/uL (ref 0.9–3.3)

## 2016-08-17 LAB — COMPREHENSIVE METABOLIC PANEL
ALK PHOS: 68 U/L (ref 40–150)
ALT: 16 U/L (ref 0–55)
AST: 13 U/L (ref 5–34)
Albumin: 3.5 g/dL (ref 3.5–5.0)
Anion Gap: 7 mEq/L (ref 3–11)
BUN: 11.4 mg/dL (ref 7.0–26.0)
CHLORIDE: 107 meq/L (ref 98–109)
CO2: 28 mEq/L (ref 22–29)
Calcium: 9.4 mg/dL (ref 8.4–10.4)
Creatinine: 0.7 mg/dL (ref 0.6–1.1)
GLUCOSE: 96 mg/dL (ref 70–140)
POTASSIUM: 5.6 meq/L — AB (ref 3.5–5.1)
SODIUM: 143 meq/L (ref 136–145)
TOTAL PROTEIN: 6.8 g/dL (ref 6.4–8.3)
Total Bilirubin: 0.23 mg/dL (ref 0.20–1.20)

## 2016-08-17 LAB — TECHNOLOGIST REVIEW

## 2016-08-17 NOTE — Progress Notes (Signed)
REFERRING PROVIDER: Artis Delay, MD [address] [place], [place] [address]  PRIMARY PROVIDER:  Patient, No Pcp Per  PRIMARY REASON FOR VISIT:  1. Endometrial cancer (HCC)   2. Family history of breast cancer   3. Family history of colon cancer   4. Family history of colonic polyps   5. Family history of brain cancer   6. Family history of prostate cancer      HISTORY OF PRESENT ILLNESS:   Misty [name], a 56 y.o. female, was seen for a [facility] cancer genetics consultation at the request of Dr. Bertis Ruddy due to a personal and family history of cancer.  Misty [name] presents to clinic today to discuss the possibility of a hereditary predisposition to cancer, genetic testing, and to further clarify her future cancer risks, as well as potential cancer risks for family members.   In 2018, at the age of 84, Misty [name] was diagnosed with uterine cancer. This was treated with complete hysterectomy and she is currently receiving chemotherapy.  Tumor testing was completed which found MSS and normal IHC.     CANCER HISTORY:  Oncology History   MSI instability not detected     Endometrial cancer (HCC)   05/19/2016 Initial Diagnosis    The patient reports abnormal uterine bleeding for approximately 6 years (2-4 bleeding episodes per year). She had a particularly heavy bleeding episode on 05/21/16 and was seen in the ED. Hb was 7.2g/dL. She was started on Megace. Her bleeding improved.       05/29/2016 Imaging    [place] on 05/29/16 showed a uterus with 9x6x5.7cm dimensions and an endometrial stripe of 66mm.       06/05/2016 Procedure    Endometrial pipelle biopsy on 06/05/16 showed a [name] 2 endometrial cancer       06/25/2016 Pathology Results    1. Lymph node, sentinel, biopsy, right external iliac METASTATIC ADENOCARCINOMA IN ONE OF ONE LYMPH NODE (1/1) 2. Lymph node, sentinel, biopsy, right obturator METASTATIC ADENOCARCINOMA IN FOUR OF FOUR LYMPH NODES (4/4) 3. Uterus +/-  tubes/ovaries, neoplastic, cervix ENDOMETRIOID ADENOCARCINOMA, FIGO [name] 3, THE TUMOR INVADES OUTER HALF OF THE MYOMETRIUM (PT1B) ALL MARGINS OF RESECTION ARE NEGATIVE FOR TUMOR BENIGN BILATERAL OVARIES AND FALLOPIAN TUBES Microscopic Comment 3. ONCOLOGY TABLE-UTERUS, CARCINOMA OR CARCINOSARCOMA  Specimen: Uterus, bilateral fallopian tubes and ovaries Procedure: Hysterectomy bilateral salpingo-oophorectomy Lymph node sampling performed: Yes Specimen integrity: Intact Maximum tumor size: 6.2 cm Histologic type: Endometrioid adenocarcinoma [name]: 3 Myometrial invasion: 1.5 cm where myometrium is 2.6 cm in thickness Cervical stromal involvement: Negative Extent of involvement of other organs: Negative Lymph - vascular invasion: Identified Peritoneal washings: Not performed Lymph nodes: Examined: 5 Sentinel 0 Non-sentinel 5 Total Lymph nodes with metastasis: 5 Isolated tumor cells (< 0.2 mm): 0 Micrometastasis: (> 0.2 mm and < 2.0 mm): 2 Macrometastasis: (> 2.0 mm): 3 Extracapsular extension: 1 Pelvic lymph nodes: 5 involved of 5 lymph nodes. Para-aortic lymph nodes: NA involved of NA lymph nodes. Other (specify involvement and site): NA TNM code: pT1b, pN1 FIGO Stage (based on pathologic findings, needs clinical correlation): IIIC1       06/25/2016 Surgery    Surgeon: Quinn Axe   Operation: Robotic-assisted laparoscopic total hysterectomy uterus >250gm with bilateral salpingoophorectomy, SLN biopsy  Operative Findings:  : 12cm uterus, normal tubes and ovaries. Large volume visceral adiposity limiting exposure and visualization and increasing the duration of the procedure.        07/08/2016 Imaging    CT scan  of chest, abdomen and pelvis 1. Status post hysterectomy and bilateral salpingo-oophorectomy. No postoperative complicating features such as abnormal fluid collection or hematoma. 2. Small bilateral pelvic lymph nodes are indeterminate but recommend  surveillance. 3. No findings for metastatic disease involving the chest or abdomen. 4. Moderate to large hiatal hernia. 5. Age advanced atherosclerotic calcifications involving the aorta and iliac arteries.      07/31/2016 Procedure    Successful placement of a right IJ approach Power Port with ultrasound and fluoroscopic guidance. The catheter is ready for use        HORMONAL RISK FACTORS:  Menarche was at age 18.  First live birth at age 45.  OCP use for approximately <5 years.  Ovaries intact: no.  Hysterectomy: yes.  Menopausal status: postmenopausal.  HRT use: 0 years. Colonoscopy: no; not examined. Mammogram within the last year: no. Number of breast biopsies: 0. Up to date with pelvic exams:  yes. Any excessive radiation exposure in the past:  no  Past Medical History:  Diagnosis Date  . Anxiety   . Endometrial cancer ([place])   . Family history of brain cancer   . Family history of breast cancer   . Family history of colon cancer   . Family history of colonic polyps   . Family history of prostate cancer   . Hypertension     Past Surgical History:  Procedure Laterality Date  . CHOLECYSTECTOMY    . IR FLUORO GUIDE PORT INSERTION RIGHT  07/31/2016  . IR US GUIDE VASC ACCESS RIGHT  07/31/2016  . LYMPH NODE BIOPSY N/A 06/25/2016   Procedure: LYMPH NODE BIOPSY;  Surgeon: Everitt Amber, MD;  Location: [facility];  Service: Gynecology;  Laterality: N/A;  . ROBOTIC ASSISTED TOTAL HYSTERECTOMY WITH BILATERAL SALPINGO OOPHERECTOMY Bilateral 06/25/2016   Procedure: XI ROBOTIC ASSISTED TOTAL HYSTERECTOMY WITH BILATERAL SALPINGO OOPHORECTOMY for uterus greater 250 grams ;  Surgeon: Everitt Amber, MD;  Location: [facility];  Service: Gynecology;  Laterality: Bilateral;  . TONSILLECTOMY      Social History   Social History  . Marital status: Married    Spouse name: [name]  . Number of children: 2  . Years of education: N/A   Occupational History  . retired    Social History Main  Topics  . Smoking status: Never Smoker  . Smokeless tobacco: Never Used  . Alcohol use 0.6 oz/week    1 Glasses of wine per week     Comment: Occas  . Drug use: No  . Sexual activity: Not Asked   Other Topics Concern  . None   Social History Narrative  . None     FAMILY HISTORY:  We obtained a detailed, 4-generation family history.  Significant diagnoses are listed below: Family History  Problem Relation Age of Onset  . Breast cancer Sister 5  . Colon cancer Paternal Grandfather 67  . Breast cancer Sister 42       Negative genetic testing on a 46 gene panel through [facility]  . Thyroid cancer Sister 58  . Fibroids Mother   . Colon polyps Father   . Healthy Brother   . Breast cancer Maternal Aunt 48  . Prostate cancer Maternal Uncle 85  . Healthy Paternal Aunt   . Heart disease Maternal Grandmother   . Stroke Paternal Grandmother   . Colon polyps Sister   . Colon polyps Sister   . Healthy Sister   . Healthy Brother   . Brain cancer Maternal Aunt 71  died 3 days after diagnosis  . Breast cancer Maternal Aunt 80  . Healthy Paternal Aunt     The patient has two sons who are cancer free.  She is one of eight children.  She has two brothers who are cancer free.  Two sisters have been diagnosed with breast cancer, one at 47 and the other at 48.  The younger sister also had thyroid cancer at 45 and has colon polyps.  She underwent genetic testing through [facility] for a 45 gene panel and was negative.  Two additional sisters also have colon polyps.  The patient's father is deceased at 55 secondary to a fall.  He had colon polyps.  He had one brother and two sisters.  Both sisters are living in their 7's and are cancer free.  His brother died of unknown causes.  His mother died of a stroke and his father died of colon cancer that was diagnosed at age 20.  The patient's mother is alive.  She had a hysterectomy due to fibroids and a difficult 8th pregnancy.  She is one of 10  children.  She has two sisters who had breast cancer, one at 33 and the other at 108.  One brother had prostate cancer at 71 and another sister had brain cancer.  There is no other cancer reported in the family.  Patient's maternal ancestors are of [place] and [place] descent, and paternal ancestors are of [place] descent. There is no reported Ashkenazi Jewish ancestry. There is no known consanguinity.  GENETIC COUNSELING ASSESSMENT: Anikah Hogge is a 56 y.o. female with a personal and family history of cancer which is somewhat suggestive of a hereditary cancer syndrome and predisposition to cancer. We, therefore, discussed and recommended the following at today's visit.   DISCUSSION: We discussed that about 5% of endometrial cancer is due to hereditary causes, most commonly associated with Lynch syndrome.  We discussed that there is a lot of cancer in the family, and her sister, who had breast and thyroid cancer diagnosed at a young age is probably the most appropriate to test.  She underwent genetic testing which was negative.  We reviewed familial patters of cancer and the patient understands that even though her sister had a negative test and was the best person to test, it does not mean that her own testing will be negative.    We reviewed the patient's tumor testing.  IHC and MSI analyses are screening tests (either by themselves or in conjunction) that are typically performed on colon and endometrial cancer tissue to identify individuals at risk for Lynch syndrome.  Greater than 90% of Lynch syndrome tumors are MSI-H and/or lack expression of at least one of the MMR proteins by IHC.  However, approximately 10-15% of sporadic colon cancers exhibit abnormal IHC and are MSI-H.  This most often is due to abnormal methylation of the MLH1 gene promoter, rather than due to Lynch syndrome, but can also be seen due to a mutant BRAF V600E.  Thus, the presence of an abnormal IHC test increases the possibility of  Lynch syndrome but does not make a definitive diagnosis.  Confirmed diagnosis of Lynch syndrome is based on germline testing, when tumor based testing scenarios or other factors raise suspicion for the diagnosis.  Somatic MMR genetic testing of the corresponding gene(s) could be performed on tumor DNA to assess for somatic mutations that might explain the abnormal IHC and/or MSI-H results. The patient's tumor showed MSS and normal IHC.  While  normal IHC results are suggestive of a sporadic tumor, there is a 5-10% false-negative rate with IHC testing.  Therefore, if there is a clinical suspicion for Lynch syndrome despite a normal IHC screening result, genetic testing may be warranted. Based on her family history of early colon cancer in her paternal grandfather and the colon polyps in her father and siblings, she should consider genetic testing.  We reviewed the characteristics, features and inheritance patterns of hereditary cancer syndromes. We also discussed genetic testing, including the appropriate family members to test, the process of testing, insurance coverage and turn-around-time for results. We discussed the implications of a negative, positive and/or variant of uncertain significant result. We recommended Misty [name] pursue genetic testing for the [employer]. The Multi-Gene Panel offered by [facility] includes sequencing and/or deletion duplication testing of the following 80 genes: ALK, APC, ATM, AXIN2,BAP1,  BARD1, BLM, BMPR1A, BRCA1, BRCA2, BRIP1, CASR, CDC73, CDH1, CDK4, CDKN1B, CDKN1C, CDKN2A (p14ARF), CDKN2A (p16INK4a), CEBPA, CHEK2, CTNNA1, DICER1, DIS3L2, EGFR (c.2369C>T, p.Thr790Met variant only), EPCAM (Deletion/duplication testing only), FH, FLCN, GATA2, GPC3, GREM1 (Promoter region deletion/duplication testing only), HOXB13 (c.251G>A, p.Gly84Glu), HRAS, KIT, MAX, MEN1, MET, MITF (c.952G>A, p.Glu318Lys variant only), MLH1, MSH2, MSH3, MSH6, MUTYH, NBN, NF1, NF2, NTHL1, PALB2, PDGFRA,  PHOX2B, PMS2, POLD1, POLE, POT1, PRKAR1A, PTCH1, PTEN, RAD50, RAD51C, RAD51D, RB1, RECQL4, RET, RUNX1, SDHAF2, SDHA (sequence changes only), SDHB, SDHC, SDHD, SMAD4, SMARCA4, SMARCB1, SMARCE1, STK11, SUFU, TERT, TERT, TMEM127, TP53, TSC1, TSC2, VHL, WRN and WT1.    Based on Misty [name]'s personal and family history of cancer, she meets medical criteria for genetic testing. Despite that she meets criteria, she may still have an out of pocket cost. We discussed that if her out of pocket cost for testing is over $100, the laboratory will call and confirm whether she wants to proceed with testing.  If the out of pocket cost of testing is less than $100 she will be billed by the genetic testing laboratory.   PLAN: After considering the risks, benefits, and limitations, Misty [name]  provided informed consent to pursue genetic testing and the blood sample was sent to [facility] for analysis of the Multi-gene panel. Results should be available within approximately 2-3 weeks' time, at which point they will be disclosed by telephone to Misty [name], as will any additional recommendations warranted by these results. Misty [name] will receive a summary of her genetic counseling visit and a copy of her results once available. This information will also be available in Epic. We encouraged Misty [name] to remain in contact with cancer genetics annually so that we can continuously update the family history and inform her of any changes in cancer genetics and testing that may be of benefit for her family. Misty [name] questions were answered to her satisfaction today. Our contact information was provided should additional questions or concerns arise.  Lastly, we encouraged Misty [name] to remain in contact with cancer genetics annually so that we can continuously update the family history and inform her of any changes in cancer genetics and testing that may be of benefit for this family.   Ms.  [name]'s questions  were answered to her satisfaction today. Our contact information was provided should additional questions or concerns arise. Thank you for the referral and allowing [place] to share in the care of your patient.   Karen P. Florene Glen, [place], [facility] Certified Genetic Counselor [email or web address] phone: [phone]  The patient was seen for a total of 60 minutes in face-to-face genetic counseling.  This  patient was discussed with Drs. Magrinat, Lindi Adie and/or Burr Medico who agrees with the above.    _______________________________________________________________________ For Office Staff:  Number of people involved in session: 1 Was an Intern/ student involved with case: no

## 2016-08-24 ENCOUNTER — Telehealth: Payer: Self-pay

## 2016-08-24 NOTE — Telephone Encounter (Signed)
Pt has 2nd chemo tomorrow. Pt asked about claritin and if it could help her. Instructed her yes it could help and to take it a few day before chemo to a few days after chemo, clarify with Dr Alvy Bimler tomorrow.  She is also asking for a rx for a wig. She sees Dr Alvy Bimler tomorrow 8/14 and could get rx at that time.

## 2016-08-24 NOTE — Telephone Encounter (Signed)
agree

## 2016-08-25 ENCOUNTER — Other Ambulatory Visit (HOSPITAL_BASED_OUTPATIENT_CLINIC_OR_DEPARTMENT_OTHER): Payer: Managed Care, Other (non HMO)

## 2016-08-25 ENCOUNTER — Encounter: Payer: Self-pay | Admitting: Hematology and Oncology

## 2016-08-25 ENCOUNTER — Ambulatory Visit (HOSPITAL_BASED_OUTPATIENT_CLINIC_OR_DEPARTMENT_OTHER): Payer: Managed Care, Other (non HMO)

## 2016-08-25 ENCOUNTER — Ambulatory Visit (HOSPITAL_BASED_OUTPATIENT_CLINIC_OR_DEPARTMENT_OTHER): Payer: Managed Care, Other (non HMO) | Admitting: Hematology and Oncology

## 2016-08-25 ENCOUNTER — Ambulatory Visit: Payer: Managed Care, Other (non HMO)

## 2016-08-25 DIAGNOSIS — C541 Malignant neoplasm of endometrium: Secondary | ICD-10-CM

## 2016-08-25 DIAGNOSIS — R030 Elevated blood-pressure reading, without diagnosis of hypertension: Secondary | ICD-10-CM | POA: Insufficient documentation

## 2016-08-25 DIAGNOSIS — C775 Secondary and unspecified malignant neoplasm of intrapelvic lymph nodes: Secondary | ICD-10-CM

## 2016-08-25 DIAGNOSIS — Z95828 Presence of other vascular implants and grafts: Secondary | ICD-10-CM

## 2016-08-25 DIAGNOSIS — Z5111 Encounter for antineoplastic chemotherapy: Secondary | ICD-10-CM | POA: Diagnosis not present

## 2016-08-25 DIAGNOSIS — R110 Nausea: Secondary | ICD-10-CM | POA: Insufficient documentation

## 2016-08-25 LAB — CBC WITH DIFFERENTIAL/PLATELET
BASO%: 0.3 % (ref 0.0–2.0)
Basophils Absolute: 0 10*3/uL (ref 0.0–0.1)
EOS%: 0 % (ref 0.0–7.0)
Eosinophils Absolute: 0 10*3/uL (ref 0.0–0.5)
HCT: 39.1 % (ref 34.8–46.6)
HEMOGLOBIN: 12.7 g/dL (ref 11.6–15.9)
LYMPH%: 7.2 % — AB (ref 14.0–49.7)
MCH: 25.6 pg (ref 25.1–34.0)
MCHC: 32.6 g/dL (ref 31.5–36.0)
MCV: 78.5 fL — AB (ref 79.5–101.0)
MONO#: 0.2 10*3/uL (ref 0.1–0.9)
MONO%: 2.2 % (ref 0.0–14.0)
NEUT%: 90.3 % — ABNORMAL HIGH (ref 38.4–76.8)
NEUTROS ABS: 9.4 10*3/uL — AB (ref 1.5–6.5)
Platelets: 314 10*3/uL (ref 145–400)
RBC: 4.98 10*6/uL (ref 3.70–5.45)
RDW: 19.8 % — AB (ref 11.2–14.5)
WBC: 10.4 10*3/uL — AB (ref 3.9–10.3)
lymph#: 0.7 10*3/uL — ABNORMAL LOW (ref 0.9–3.3)

## 2016-08-25 LAB — COMPREHENSIVE METABOLIC PANEL
ALBUMIN: 3.3 g/dL — AB (ref 3.5–5.0)
ALT: 12 U/L (ref 0–55)
AST: 11 U/L (ref 5–34)
Alkaline Phosphatase: 73 U/L (ref 40–150)
Anion Gap: 9 mEq/L (ref 3–11)
BILIRUBIN TOTAL: 0.23 mg/dL (ref 0.20–1.20)
BUN: 18.5 mg/dL (ref 7.0–26.0)
CO2: 24 meq/L (ref 22–29)
CREATININE: 0.7 mg/dL (ref 0.6–1.1)
Calcium: 9.3 mg/dL (ref 8.4–10.4)
Chloride: 107 mEq/L (ref 98–109)
EGFR: >90 mL/min/{1.73_m2} (ref >90)
GLUCOSE: 126 mg/dL (ref 70–140)
Potassium: 4.5 mEq/L (ref 3.5–5.1)
SODIUM: 139 meq/L (ref 136–145)
TOTAL PROTEIN: 6.8 g/dL (ref 6.4–8.3)

## 2016-08-25 MED ORDER — DIPHENHYDRAMINE HCL 50 MG/ML IJ SOLN
INTRAMUSCULAR | Status: AC
  Filled 2016-08-25: qty 1

## 2016-08-25 MED ORDER — FAMOTIDINE IN NACL 20-0.9 MG/50ML-% IV SOLN
INTRAVENOUS | Status: AC
  Filled 2016-08-25: qty 50

## 2016-08-25 MED ORDER — DIPHENHYDRAMINE HCL 50 MG/ML IJ SOLN
50.0000 mg | Freq: Once | INTRAMUSCULAR | Status: AC
  Administered 2016-08-25: 50 mg via INTRAVENOUS

## 2016-08-25 MED ORDER — PALONOSETRON HCL INJECTION 0.25 MG/5ML
0.2500 mg | Freq: Once | INTRAVENOUS | Status: AC
  Administered 2016-08-25: 0.25 mg via INTRAVENOUS

## 2016-08-25 MED ORDER — PACLITAXEL CHEMO INJECTION 300 MG/50ML
175.0000 mg/m2 | Freq: Once | INTRAVENOUS | Status: AC
  Administered 2016-08-25: 432 mg via INTRAVENOUS
  Filled 2016-08-25: qty 72

## 2016-08-25 MED ORDER — HEPARIN SOD (PORK) LOCK FLUSH 100 UNIT/ML IV SOLN
500.0000 [IU] | Freq: Once | INTRAVENOUS | Status: AC | PRN
  Administered 2016-08-25: 500 [IU]
  Filled 2016-08-25: qty 5

## 2016-08-25 MED ORDER — CARBOPLATIN CHEMO INJECTION 600 MG/60ML
750.0000 mg | Freq: Once | INTRAVENOUS | Status: AC
  Administered 2016-08-25: 750 mg via INTRAVENOUS
  Filled 2016-08-25: qty 75

## 2016-08-25 MED ORDER — FAMOTIDINE IN NACL 20-0.9 MG/50ML-% IV SOLN
20.0000 mg | Freq: Once | INTRAVENOUS | Status: AC
  Administered 2016-08-25: 20 mg via INTRAVENOUS

## 2016-08-25 MED ORDER — SODIUM CHLORIDE 0.9% FLUSH
10.0000 mL | INTRAVENOUS | Status: DC | PRN
  Administered 2016-08-25: 10 mL via INTRAVENOUS
  Filled 2016-08-25: qty 10

## 2016-08-25 MED ORDER — PALONOSETRON HCL INJECTION 0.25 MG/5ML
INTRAVENOUS | Status: AC
Start: 2016-08-25 — End: 2016-08-25
  Filled 2016-08-25: qty 5

## 2016-08-25 MED ORDER — SODIUM CHLORIDE 0.9 % IV SOLN
Freq: Once | INTRAVENOUS | Status: AC
  Administered 2016-08-25: 09:00:00 via INTRAVENOUS
  Filled 2016-08-25: qty 5

## 2016-08-25 MED ORDER — SODIUM CHLORIDE 0.9% FLUSH
10.0000 mL | INTRAVENOUS | Status: DC | PRN
  Administered 2016-08-25: 10 mL
  Filled 2016-08-25: qty 10

## 2016-08-25 MED ORDER — SODIUM CHLORIDE 0.9 % IV SOLN
Freq: Once | INTRAVENOUS | Status: AC
  Administered 2016-08-25: 09:00:00 via INTRAVENOUS

## 2016-08-25 NOTE — Assessment & Plan Note (Addendum)
She tolerated treatment well with expected side effects She will begin adjuvant radiation therapy soon Her blood count is amazing and she does not need growth factor support However, I cannot guarantee, with additional radiation treatment, she may need growth factor in the future I will see her back prior to cycle 3 of chemotherapy

## 2016-08-25 NOTE — Patient Instructions (Signed)
[facility] Discharge Instructions for Patients Receiving Chemotherapy  Today you received the following chemotherapy agents Taxol/Carboplatin To help prevent nausea and vomiting after your treatment, we encourage you to take your nausea medication as prescribed.   If you develop nausea and vomiting that is not controlled by your nausea medication, call the clinic.   BELOW ARE SYMPTOMS THAT SHOULD BE REPORTED IMMEDIATELY:  *FEVER GREATER THAN 100.5 F  *CHILLS WITH OR WITHOUT FEVER  NAUSEA AND VOMITING THAT IS NOT CONTROLLED WITH YOUR NAUSEA MEDICATION  *UNUSUAL SHORTNESS OF BREATH  *UNUSUAL BRUISING OR BLEEDING  TENDERNESS IN MOUTH AND THROAT WITH OR WITHOUT PRESENCE OF ULCERS  *URINARY PROBLEMS  *BOWEL PROBLEMS  UNUSUAL RASH Items with * indicate a potential emergency and should be followed up as soon as possible.  Feel free to call the clinic you have any questions or concerns. The clinic phone number is [phone].  Please show the CHEMO ALERT CARD at check-in to the Emergency Department and triage nurse.   

## 2016-08-25 NOTE — Progress Notes (Signed)
[facility] OFFICE PROGRESS NOTE  Patient Care Team: Patient, No Pcp Per as PCP - General (General Practice)  SUMMARY OF ONCOLOGIC HISTORY: Oncology History   MSI instability not detected     Endometrial cancer ([place])   05/19/2016 Initial Diagnosis    The patient reports abnormal uterine bleeding for approximately 6 years (2-4 bleeding episodes per year). She had a particularly heavy bleeding episode on 05/21/16 and was seen in the ED. Hb was 7.2g/dL. She was started on Megace. Her bleeding improved.       05/29/2016 Imaging    [place] on 05/29/16 showed a uterus with 9x6x5.7cm dimensions and an endometrial stripe of 60m.       06/05/2016 Procedure    Endometrial pipelle biopsy on 06/05/16 showed a grade 2 endometrial cancer       06/25/2016 Pathology Results    1. Lymph node, sentinel, biopsy, right external iliac METASTATIC ADENOCARCINOMA IN ONE OF ONE LYMPH NODE (1/1) 2. Lymph node, sentinel, biopsy, right obturator METASTATIC ADENOCARCINOMA IN FOUR OF FOUR LYMPH NODES (4/4) 3. Uterus +/- tubes/ovaries, neoplastic, cervix ENDOMETRIOID ADENOCARCINOMA, FIGO GRADE 3, THE TUMOR INVADES OUTER HALF OF THE MYOMETRIUM (PT1B) ALL MARGINS OF RESECTION ARE NEGATIVE FOR TUMOR BENIGN BILATERAL OVARIES AND FALLOPIAN TUBES Microscopic Comment 3. ONCOLOGY TABLE-UTERUS, CARCINOMA OR CARCINOSARCOMA  Specimen: Uterus, bilateral fallopian tubes and ovaries Procedure: Hysterectomy bilateral salpingo-oophorectomy Lymph node sampling performed: Yes Specimen integrity: Intact Maximum tumor size: 6.2 cm Histologic type: Endometrioid adenocarcinoma Grade: 3 Myometrial invasion: 1.5 cm where myometrium is 2.6 cm in thickness Cervical stromal involvement: Negative Extent of involvement of other organs: Negative Lymph - vascular invasion: Identified Peritoneal washings: Not performed Lymph nodes: Examined: 5 Sentinel 0 Non-sentinel 5 Total Lymph nodes with metastasis: 5 Isolated tumor  cells (< 0.2 mm): 0 Micrometastasis: (> 0.2 mm and < 2.0 mm): 2 Macrometastasis: (> 2.0 mm): 3 Extracapsular extension: 1 Pelvic lymph nodes: 5 involved of 5 lymph nodes. Para-aortic lymph nodes: NA involved of NA lymph nodes. Other (specify involvement and site): NA TNM code: pT1b, pN1 FIGO Stage (based on pathologic findings, needs clinical correlation): IIIC1       06/25/2016 Surgery    Surgeon: RDonaciano Eva  Operation: Robotic-assisted laparoscopic total hysterectomy uterus >250gm with bilateral salpingoophorectomy, SLN biopsy  Operative Findings:  : 12cm uterus, normal tubes and ovaries. Large volume visceral adiposity limiting exposure and visualization and increasing the duration of the procedure.        07/08/2016 Imaging    CT scan of chest, abdomen and pelvis 1. Status post hysterectomy and bilateral salpingo-oophorectomy. No postoperative complicating features such as abnormal fluid collection or hematoma. 2. Small bilateral pelvic lymph nodes are indeterminate but recommend surveillance. 3. No findings for metastatic disease involving the chest or abdomen. 4. Moderate to large hiatal hernia. 5. Age advanced atherosclerotic calcifications involving the aorta and iliac arteries.      07/31/2016 Procedure    Successful placement of a right IJ approach Power Port with ultrasound and fluoroscopic guidance. The catheter is ready for use      08/04/2016 -  Chemotherapy    She received carboplatin and taxol       INTERVAL HISTORY: Please see below for problem oriented charting. She is seen prior to cycle 2 of treatment She tolerated cycle 1 well Denies infection She had mild nausea, resolved with antiemetics Denies constipation Her head has started to fall out and she is requesting prescription for a wig  REVIEW OF  SYSTEMS:   Constitutional: Denies fevers, chills or abnormal weight loss Eyes: Denies blurriness of vision Ears, nose, mouth, throat,  and face: Denies mucositis or sore throat Respiratory: Denies cough, dyspnea or wheezes Cardiovascular: Denies palpitation, chest discomfort or lower extremity swelling Gastrointestinal:  Denies nausea, heartburn or change in bowel habits Skin: Denies abnormal skin rashes Lymphatics: Denies new lymphadenopathy or easy bruising Neurological:Denies numbness, tingling or new weaknesses Behavioral/Psych: Mood is stable, no new changes  All other systems were reviewed with the patient and are negative.  I have reviewed the past medical history, past surgical history, social history and family history with the patient and they are unchanged from previous note.  ALLERGIES:  has No Known Allergies.  MEDICATIONS:  Current Outpatient Prescriptions  Medication Sig Dispense Refill  . ALPRAZolam (XANAX) 0.5 MG tablet Take 0.25-0.5 mg by mouth daily as needed for anxiety (FOR PANIC/ANXIETY).     . busPIRone (BUSPAR) 15 MG tablet Take 7.5 mg by mouth daily.     Marland Kitchen dexamethasone (DECADRON) 4 MG tablet Take 5 tablets the day before chemo and 5 tablets at 6 am the day of chemo with food, every 3 weeks 30 tablet 1  . escitalopram (LEXAPRO) 20 MG tablet Take 10 mg by mouth daily.     Marland Kitchen lidocaine-prilocaine (EMLA) cream Apply 1 application topically as needed. 30 g 3  . lidocaine-prilocaine (EMLA) cream Apply to affected area once 30 g 3  . ondansetron (ZOFRAN) 8 MG tablet Take 1 tablet (8 mg total) by mouth 2 (two) times daily as needed for refractory nausea / vomiting. Start on day 3 after chemo. 30 tablet 1  . prochlorperazine (COMPAZINE) 10 MG tablet Take 1 tablet (10 mg total) by mouth every 6 (six) hours as needed (Nausea or vomiting). 30 tablet 1   No current facility-administered medications for this visit.     PHYSICAL EXAMINATION: ECOG PERFORMANCE STATUS: 0 - Asymptomatic  Vitals:   08/25/16 0823  BP: (!) 161/94  Pulse: 63  Resp: 18  Temp: 98.6 F (37 C)  SpO2: 98%   Filed Weights    08/25/16 0823  Weight: 294 lb 4.8 oz (133.5 kg)    GENERAL:alert, no distress and comfortable SKIN: skin color, texture, turgor are normal, no rashes or significant lesions EYES: normal, Conjunctiva are pink and non-injected, sclera clear OROPHARYNX:no exudate, no erythema and lips, buccal mucosa, and tongue normal  NECK: supple, thyroid normal size, non-tender, without nodularity LYMPH:  no palpable lymphadenopathy in the cervical, axillary or inguinal LUNGS: clear to auscultation and percussion with normal breathing effort HEART: regular rate & rhythm and no murmurs and no lower extremity edema ABDOMEN:abdomen soft, non-tender and normal bowel sounds Musculoskeletal:no cyanosis of digits and no clubbing  NEURO: alert & oriented x 3 with fluent speech, no focal motor/sensory deficits  LABORATORY DATA:  I have reviewed the data as listed    Component Value Date/Time   NA 139 08/25/2016 0742   K 4.5 08/25/2016 0742   CL 105 07/31/2016 1146   CO2 24 08/25/2016 0742   GLUCOSE 126 08/25/2016 0742   BUN 18.5 08/25/2016 0742   CREATININE 0.7 08/25/2016 0742   CALCIUM 9.3 08/25/2016 0742   PROT 6.8 08/25/2016 0742   ALBUMIN 3.3 (L) 08/25/2016 0742   AST 11 08/25/2016 0742   ALT 12 08/25/2016 0742   ALKPHOS 73 08/25/2016 0742   BILITOT 0.23 08/25/2016 0742   GFRNONAA >60 07/31/2016 1146   GFRAA >60 07/31/2016 1146  No results found for: SPEP, UPEP  Lab Results  Component Value Date   WBC 10.4 (H) 08/25/2016   NEUTROABS 9.4 (H) 08/25/2016   HGB 12.7 08/25/2016   HCT 39.1 08/25/2016   MCV 78.5 (L) 08/25/2016   PLT 314 08/25/2016      Chemistry      Component Value Date/Time   NA 139 08/25/2016 0742   K 4.5 08/25/2016 0742   CL 105 07/31/2016 1146   CO2 24 08/25/2016 0742   BUN 18.5 08/25/2016 0742   CREATININE 0.7 08/25/2016 0742      Component Value Date/Time   CALCIUM 9.3 08/25/2016 0742   ALKPHOS 73 08/25/2016 0742   AST 11 08/25/2016 0742   ALT 12  08/25/2016 0742   BILITOT 0.23 08/25/2016 0742       RADIOGRAPHIC STUDIES: I have personally reviewed the radiological images as listed and agreed with the findings in the report. Ir US Guide Vasc Access Right  Result Date: 07/31/2016 INDICATION: 56 year old female with endometrial cancer. She requires durable venous access for chemotherapy. EXAM: IMPLANTED PORT A CATH PLACEMENT WITH ULTRASOUND AND FLUOROSCOPIC GUIDANCE MEDICATIONS: 2 g Ancef; The antibiotic was administered within an appropriate time interval prior to skin puncture. ANESTHESIA/SEDATION: Versed 2 mg IV; Fentanyl 100 mcg IV; Moderate Sedation Time:  25 minutes The patient was continuously monitored during the procedure by the interventional radiology nurse under my direct supervision. FLUOROSCOPY TIME:  0 minutes, 24 seconds (less than 1 mGy) COMPLICATIONS: None immediate. PROCEDURE: The right neck and chest was prepped with chlorhexidine, and draped in the usual sterile fashion using maximum barrier technique (cap and mask, sterile gown, sterile gloves, large sterile sheet, hand hygiene and cutaneous antiseptic). Antibiotic prophylaxis was provided with 2g Ancef administered IV one hour prior to skin incision. Local anesthesia was attained by infiltration with 1% lidocaine with epinephrine. Ultrasound demonstrated patency of the right internal jugular vein, and this was documented with an image. Under real-time ultrasound guidance, this vein was accessed with a 21 gauge micropuncture needle and image documentation was performed. A small dermatotomy was made at the access site with an 11 scalpel. A 0.018" wire was advanced into the SVC and the access needle exchanged for a 45F micropuncture vascular sheath. The 0.018" wire was then removed and a 0.035" wire advanced into the IVC. An appropriate location for the subcutaneous reservoir was selected below the clavicle and an incision was made through the skin and underlying soft tissues. The  subcutaneous tissues were then dissected using a combination of blunt and sharp surgical technique and a pocket was formed. A single lumen power injectable portacatheter was then tunneled through the subcutaneous tissues from the pocket to the dermatotomy and the port reservoir placed within the subcutaneous pocket. The venous access site was then serially dilated and a peel away vascular sheath placed over the wire. The wire was removed and the port catheter advanced into position under fluoroscopic guidance. The catheter tip is positioned in the upper right atrium. This was documented with a spot image. The portacatheter was then tested and found to flush and aspirate well. The port was flushed with saline followed by 100 units/mL heparinized saline. The pocket was then closed in two layers using first subdermal inverted interrupted absorbable sutures followed by a running subcuticular suture. The epidermis was then sealed with Dermabond. The dermatotomy at the venous access site was also closed with a single inverted subdermal suture and the epidermis sealed with Dermabond. IMPRESSION: Successful placement of  a right IJ approach Power Port with ultrasound and fluoroscopic guidance. The catheter is ready for use. Electronically Signed   By: Jacqulynn Cadet M.D.   On: 07/31/2016 17:40   Ir Fluoro Guide Port Insertion Right  Result Date: 07/31/2016 INDICATION: 57 year old female with endometrial cancer. She requires durable venous access for chemotherapy. EXAM: IMPLANTED PORT A CATH PLACEMENT WITH ULTRASOUND AND FLUOROSCOPIC GUIDANCE MEDICATIONS: 2 g Ancef; The antibiotic was administered within an appropriate time interval prior to skin puncture. ANESTHESIA/SEDATION: Versed 2 mg IV; Fentanyl 100 mcg IV; Moderate Sedation Time:  25 minutes The patient was continuously monitored during the procedure by the interventional radiology nurse under my direct supervision. FLUOROSCOPY TIME:  0 minutes, 24 seconds (less  than 1 mGy) COMPLICATIONS: None immediate. PROCEDURE: The right neck and chest was prepped with chlorhexidine, and draped in the usual sterile fashion using maximum barrier technique (cap and mask, sterile gown, sterile gloves, large sterile sheet, hand hygiene and cutaneous antiseptic). Antibiotic prophylaxis was provided with 2g Ancef administered IV one hour prior to skin incision. Local anesthesia was attained by infiltration with 1% lidocaine with epinephrine. Ultrasound demonstrated patency of the right internal jugular vein, and this was documented with an image. Under real-time ultrasound guidance, this vein was accessed with a 21 gauge micropuncture needle and image documentation was performed. A small dermatotomy was made at the access site with an 11 scalpel. A 0.018" wire was advanced into the SVC and the access needle exchanged for a 85F micropuncture vascular sheath. The 0.018" wire was then removed and a 0.035" wire advanced into the IVC. An appropriate location for the subcutaneous reservoir was selected below the clavicle and an incision was made through the skin and underlying soft tissues. The subcutaneous tissues were then dissected using a combination of blunt and sharp surgical technique and a pocket was formed. A single lumen power injectable portacatheter was then tunneled through the subcutaneous tissues from the pocket to the dermatotomy and the port reservoir placed within the subcutaneous pocket. The venous access site was then serially dilated and a peel away vascular sheath placed over the wire. The wire was removed and the port catheter advanced into position under fluoroscopic guidance. The catheter tip is positioned in the upper right atrium. This was documented with a spot image. The portacatheter was then tested and found to flush and aspirate well. The port was flushed with saline followed by 100 units/mL heparinized saline. The pocket was then closed in two layers using first  subdermal inverted interrupted absorbable sutures followed by a running subcuticular suture. The epidermis was then sealed with Dermabond. The dermatotomy at the venous access site was also closed with a single inverted subdermal suture and the epidermis sealed with Dermabond. IMPRESSION: Successful placement of a right IJ approach Power Port with ultrasound and fluoroscopic guidance. The catheter is ready for use. Electronically Signed   By: Jacqulynn Cadet M.D.   On: 07/31/2016 17:40    ASSESSMENT & PLAN:  Endometrial cancer ([place]) She tolerated treatment well with expected side effects She will begin adjuvant radiation therapy soon Her blood count is amazing and she does not need growth factor support However, I cannot guarantee, with additional radiation treatment, she may need growth factor in the future I will see her back prior to cycle 3 of chemotherapy  White coat syndrome without diagnosis of hypertension Her blood pressure is elevated but she attributed that to anxiety We will continue to monitor her blood pressure closely  Nausea  without vomiting She has mild nausea without vomiting We discussed anti-emetics use after treatment   No orders of the defined types were placed in this encounter.  All questions were answered. The patient knows to call the clinic with any problems, questions or concerns. No barriers to learning was detected. I spent 15 minutes counseling the patient face to face. The total time spent in the appointment was 20 minutes and more than 50% was on counseling and review of test results     Heath Lark, MD 08/25/2016 8:54 AM

## 2016-08-25 NOTE — Assessment & Plan Note (Signed)
Her blood pressure is elevated but she attributed that to anxiety We will continue to monitor her blood pressure closely 

## 2016-08-25 NOTE — Assessment & Plan Note (Signed)
She has mild nausea without vomiting We discussed anti-emetics use after treatment 

## 2016-08-28 ENCOUNTER — Encounter: Payer: Self-pay | Admitting: Genetic Counselor

## 2016-08-28 ENCOUNTER — Telehealth: Payer: Self-pay | Admitting: Genetic Counselor

## 2016-08-28 DIAGNOSIS — Z1379 Encounter for other screening for genetic and chromosomal anomalies: Secondary | ICD-10-CM | POA: Insufficient documentation

## 2016-08-28 NOTE — Telephone Encounter (Signed)
LM on VM with good news.  Asked that she CB,

## 2016-08-28 NOTE — Telephone Encounter (Signed)
HPI: Misty [name] was previously seen in the [place] clinic due to a personal and family history of cancer and concerns regarding a hereditary predisposition to cancer. Please refer to our prior cancer genetics clinic note for more information regarding Misty [name]'s medical, social and family histories, and our assessment and recommendations, at the time. Misty [name]'s recent genetic test results were disclosed to her, as were recommendations warranted by these results. These results and recommendations are discussed in more detail below.  CANCER HISTORY:  Oncology History   MSI instability not detected     Endometrial cancer ([place])   05/19/2016 Initial Diagnosis    The patient reports abnormal uterine bleeding for approximately 6 years (2-4 bleeding episodes per year). She had a particularly heavy bleeding episode on 05/21/16 and was seen in the ED. Hb was 7.2g/dL. She was started on Megace. Her bleeding improved.       05/29/2016 Imaging    [place] on 05/29/16 showed a uterus with 9x6x5.7cm dimensions and an endometrial stripe of 63m.       06/05/2016 Procedure    Endometrial pipelle biopsy on 06/05/16 showed a grade 2 endometrial cancer       06/25/2016 Pathology Results    1. Lymph node, sentinel, biopsy, right external iliac METASTATIC ADENOCARCINOMA IN ONE OF ONE LYMPH NODE (1/1) 2. Lymph node, sentinel, biopsy, right obturator METASTATIC ADENOCARCINOMA IN FOUR OF FOUR LYMPH NODES (4/4) 3. Uterus +/- tubes/ovaries, neoplastic, cervix ENDOMETRIOID ADENOCARCINOMA, FIGO GRADE 3, THE TUMOR INVADES OUTER HALF OF THE MYOMETRIUM (PT1B) ALL MARGINS OF RESECTION ARE NEGATIVE FOR TUMOR BENIGN BILATERAL OVARIES AND FALLOPIAN TUBES Microscopic Comment 3. ONCOLOGY TABLE-UTERUS, CARCINOMA OR CARCINOSARCOMA  Specimen: Uterus, bilateral fallopian tubes and ovaries Procedure: Hysterectomy bilateral salpingo-oophorectomy Lymph node sampling performed: Yes Specimen integrity:  Intact Maximum tumor size: 6.2 cm Histologic type: Endometrioid adenocarcinoma Grade: 3 Myometrial invasion: 1.5 cm where myometrium is 2.6 cm in thickness Cervical stromal involvement: Negative Extent of involvement of other organs: Negative Lymph - vascular invasion: Identified Peritoneal washings: Not performed Lymph nodes: Examined: 5 Sentinel 0 Non-sentinel 5 Total Lymph nodes with metastasis: 5 Isolated tumor cells (< 0.2 mm): 0 Micrometastasis: (> 0.2 mm and < 2.0 mm): 2 Macrometastasis: (> 2.0 mm): 3 Extracapsular extension: 1 Pelvic lymph nodes: 5 involved of 5 lymph nodes. Para-aortic lymph nodes: NA involved of NA lymph nodes. Other (specify involvement and site): NA TNM code: pT1b, pN1 FIGO Stage (based on pathologic findings, needs clinical correlation): IIIC1       06/25/2016 Surgery    Surgeon: RDonaciano Eva  Operation: Robotic-assisted laparoscopic total hysterectomy uterus >250gm with bilateral salpingoophorectomy, SLN biopsy  Operative Findings:  : 12cm uterus, normal tubes and ovaries. Large volume visceral adiposity limiting exposure and visualization and increasing the duration of the procedure.        07/08/2016 Imaging    CT scan of chest, abdomen and pelvis 1. Status post hysterectomy and bilateral salpingo-oophorectomy. No postoperative complicating features such as abnormal fluid collection or hematoma. 2. Small bilateral pelvic lymph nodes are indeterminate but recommend surveillance. 3. No findings for metastatic disease involving the chest or abdomen. 4. Moderate to large hiatal hernia. 5. Age advanced atherosclerotic calcifications involving the aorta and iliac arteries.      07/31/2016 Procedure    Successful placement of a right IJ approach Power Port with ultrasound and fluoroscopic guidance. The catheter is ready for use      08/04/2016 -  Chemotherapy  She received carboplatin and taxol      08/24/2016 Genetic Testing     MET c.769A>G VUS identified on the multi-gene cancer panel.  The Multi-Gene Panel offered by [facility] includes sequencing and/or deletion duplication testing of the following 80 genes: ALK, APC, ATM, AXIN2,BAP1,  BARD1, BLM, BMPR1A, BRCA1, BRCA2, BRIP1, CASR, CDC73, CDH1, CDK4, CDKN1B, CDKN1C, CDKN2A (p14ARF), CDKN2A (p16INK4a), CEBPA, CHEK2, CTNNA1, DICER1, DIS3L2, EGFR (c.2369C>T, p.Thr790Met variant only), EPCAM (Deletion/duplication testing only), FH, FLCN, GATA2, GPC3, GREM1 (Promoter region deletion/duplication testing only), HOXB13 (c.251G>A, p.Gly84Glu), HRAS, KIT, MAX, MEN1, MET, MITF (c.952G>A, p.Glu318Lys variant only), MLH1, MSH2, MSH3, MSH6, MUTYH, NBN, NF1, NF2, NTHL1, PALB2, PDGFRA, PHOX2B, PMS2, POLD1, POLE, POT1, PRKAR1A, PTCH1, PTEN, RAD50, RAD51C, RAD51D, RB1, RECQL4, RET, RUNX1, SDHAF2, SDHA (sequence changes only), SDHB, SDHC, SDHD, SMAD4, SMARCA4, SMARCB1, SMARCE1, STK11, SUFU, TERT, TERT, TMEM127, TP53, TSC1, TSC2, VHL, WRN and WT1.  The report date is August 24, 2016.        FAMILY HISTORY:  We obtained a detailed, 4-generation family history.  Significant diagnoses are listed below: Family History  Problem Relation Age of Onset  . Breast cancer Sister 18  . Colon cancer Paternal Grandfather 2  . Breast cancer Sister 86       Negative genetic testing on a 46 gene panel through [facility]  . Thyroid cancer Sister 63  . Fibroids Mother   . Colon polyps Father   . Healthy Brother   . Breast cancer Maternal Aunt 86  . Prostate cancer Maternal Uncle 43  . Healthy Paternal Aunt   . Heart disease Maternal Grandmother   . Stroke Paternal Grandmother   . Colon polyps Sister   . Colon polyps Sister   . Healthy Sister   . Healthy Brother   . Brain cancer Maternal Aunt 55       died 3 days after diagnosis  . Breast cancer Maternal Aunt 80  . Healthy Paternal Aunt     The patient has two sons who are cancer free.  She is one of eight children.  She has two brothers who  are cancer free.  Two sisters have been diagnosed with breast cancer, one at 32 and the other at 6.  The younger sister also had thyroid cancer at 75 and has colon polyps.  She underwent genetic testing through [facility] for a 46 gene panel and was negative.  Two additional sisters also have colon polyps.  The patient's father is deceased at 74 secondary to a fall.  He had colon polyps.  He had one brother and two sisters.  Both sisters are living in their 37's and are cancer free.  His brother died of unknown causes.  His mother died of a stroke and his father died of colon cancer that was diagnosed at age 44.  The patient's mother is alive.  She had a hysterectomy due to fibroids and a difficult 8th pregnancy.  She is one of 10 children.  She has two sisters who had breast cancer, one at 23 and the other at 65.  One brother had prostate cancer at 80 and another sister had brain cancer.  There is no other cancer reported in the family.  Patient's maternal ancestors are of [place] and [place] descent, and paternal ancestors are of [place] descent. There is no reported Ashkenazi Jewish ancestry. There is no known consanguinity.  GENETIC TEST RESULTS: Genetic testing reported out on August 24, 2016 through the [facility] cancer panel found no deleterious mutations.  The  Multi-Gene Panel offered by [facility] includes sequencing and/or deletion duplication testing of the following 80 genes: ALK, APC, ATM, AXIN2,BAP1,  BARD1, BLM, BMPR1A, BRCA1, BRCA2, BRIP1, CASR, CDC73, CDH1, CDK4, CDKN1B, CDKN1C, CDKN2A (p14ARF), CDKN2A (p16INK4a), CEBPA, CHEK2, CTNNA1, DICER1, DIS3L2, EGFR (c.2369C>T, p.Thr790Met variant only), EPCAM (Deletion/duplication testing only), FH, FLCN, GATA2, GPC3, GREM1 (Promoter region deletion/duplication testing only), HOXB13 (c.251G>A, p.Gly84Glu), HRAS, KIT, MAX, MEN1, MET, MITF (c.952G>A, p.Glu318Lys variant only), MLH1, MSH2, MSH3, MSH6, MUTYH, NBN, NF1, NF2, NTHL1, PALB2, PDGFRA, PHOX2B, PMS2,  POLD1, POLE, POT1, PRKAR1A, PTCH1, PTEN, RAD50, RAD51C, RAD51D, RB1, RECQL4, RET, RUNX1, SDHAF2, SDHA (sequence changes only), SDHB, SDHC, SDHD, SMAD4, SMARCA4, SMARCB1, SMARCE1, STK11, SUFU, TERT, TERT, TMEM127, TP53, TSC1, TSC2, VHL, WRN and WT1.  The test report has been scanned into EPIC and is located under the Molecular Pathology section of the Results Review tab.   We discussed with Ms. Zahradnik that since the current genetic testing is not perfect, it is possible there may be a gene mutation in one of these genes that current testing cannot detect, but that chance is small. We also discussed, that it is possible that another gene that has not yet been discovered, or that we have not yet tested, is responsible for the cancer diagnoses in the family, and it is, therefore, important to remain in touch with cancer genetics in the future so that we can continue to offer Ms. Stauffer the most up to date genetic testing.     CANCER SCREENING RECOMMENDATIONS: Given Ms. Millward's personal and family histories, we must interpret these negative results with some caution.  Families with features suggestive of hereditary risk for cancer tend to have multiple family members with cancer, diagnoses in multiple generations and diagnoses before the age of 49. Ms. Hackleman's family exhibits some of these features. Thus this result may simply reflect our current inability to detect all mutations within these genes or there may be a different gene that has not yet been discovered or tested.   Based on Ms. Segel's personal and family history of cancer, as well as her genetic test results, statistical models (Tyrer [place]us[place] and [place])  and literature data were used to estimate her risk of developing breast cancer. These estimate her lifetime risk of developing breast cancer to be approximately 15.8% to 25%.  The patient's lifetime breast cancer risk is a preliminary estimate based on available information using one of several  models endorsed by the [employer] (ACS). The ACS recommends consideration of breast MRI screening as an adjunct to mammography for patients at high risk (defined as 20% or greater lifetime risk). A more detailed breast cancer risk assessment can be considered, if clinically indicated.  Ms. Kamath should discuss her individual situation with her referring physician and determine a breast cancer screening plan with which they are both comfortable.      RECOMMENDATIONS FOR FAMILY MEMBERS: Women in this family might be at some increased risk of developing cancer, over the general population risk, simply due to the family history of cancer. We recommended women in this family have a yearly mammogram beginning at age 1, or 71 years younger than the earliest onset of cancer, an annual clinical breast exam, and perform monthly breast self-exams. Women in this family should also have a gynecological exam as recommended by their primary provider. All family members should have a colonoscopy by age 36.  FOLLOW-UP: Lastly, we discussed with Ms. Diskin that cancer genetics is a rapidly advancing field and it is  possible that new genetic tests will be appropriate for her and/or her family members in the future. We encouraged her to remain in contact with cancer genetics on an annual basis so we can update her personal and family histories and let her know of advances in cancer genetics that may benefit this family.   Our contact number was provided. Ms. Carriveau's questions were answered to her satisfaction, and she knows she is welcome to call us at anytime with additional questions or concerns.   Roma Kayser, MS, [facility] Certified Genetic Counselor [email or web address]

## 2016-09-04 ENCOUNTER — Telehealth: Payer: Self-pay | Admitting: *Deleted

## 2016-09-04 NOTE — Telephone Encounter (Signed)
CALLED PATIENT TO REMIND OF HDR [place] FOR 09-07-16, LVM FOR A RETURN CALL

## 2016-09-07 ENCOUNTER — Ambulatory Visit
Admission: RE | Admit: 2016-09-07 | Discharge: 2016-09-07 | Disposition: A | Discharge status: Home / Self Care | Payer: Managed Care, Other (non HMO) | Source: Ambulatory Visit | Attending: Radiation Oncology | Admitting: Radiation Oncology

## 2016-09-07 ENCOUNTER — Encounter: Payer: Self-pay | Admitting: Radiation Oncology

## 2016-09-07 VITALS — BP 144/97 | HR 69 | Temp 98.1°F | Ht 64.0 in | Wt 296.8 lb

## 2016-09-07 DIAGNOSIS — C541 Malignant neoplasm of endometrium: Secondary | ICD-10-CM

## 2016-09-07 DIAGNOSIS — C775 Secondary and unspecified malignant neoplasm of intrapelvic lymph nodes: Secondary | ICD-10-CM

## 2016-09-07 DIAGNOSIS — R030 Elevated blood-pressure reading, without diagnosis of hypertension: Secondary | ICD-10-CM

## 2016-09-07 NOTE — Progress Notes (Signed)
  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 She will be treated with a 2.5 cm diameter segmented cylinder. This conforms to her anatomy without undue discomfort.  Vaginal brachytherapy procedure node: The patient was brought to the [place] suite. Identity was confirmed. All relevant records and images related to the planned course of therapy were reviewed. The patient freely provided informed written consent to proceed with treatment after reviewing the details related to the planned course of therapy. The consent form was witnessed and verified by the simulation staff. Then, the patient was set-up in a stable reproducible supine position for radiation therapy. Pelvic exam revealed the vaginal cuff to be intact. The patient's custom vaginal cylinder was placed in the proximal vagina. This was affixed to the CT/MR stabilization plate to prevent slippage. Patient tolerated the placement well.  Verification simulation note:  A fiducial marker was placed within the vaginal cylinder. An AP and lateral film was then obtained through the pelvis area. This documented accurate position of the vaginal cylinder for treatment.  HDR BRACHYTHERAPY TREATMENT  The remote afterloading device was affixed to the vaginal cylinder by catheter. Patient then proceeded to undergo her first high-dose-rate treatment directed at the proximal vagina. The patient was prescribed a dose of 6 gray to be delivered to the mucosal surface. Treatment length was 4 cm. Patient was treated with 1 channel using 9 dwell positions. Treatment time was 243.1 seconds. Iridium 192 was the high-dose-rate source for treatment.  The patient tolerated the treatment well. After completion of her therapy, a radiation survey was performed documenting return of the iridium source into the GammaMed safe.   PLAN: The patient will return for second HDR treatment next Wednesday, 09/16/2016.  ________________________________  Blair Promise, PhD, MD   This document serves as a record of services personally performed by Gery Pray, MD. It was created on his behalf by Arlyce Harman, a trained medical scribe. The creation of this record is based on the scribe's personal observations and the provider's statements to them. This document has been checked and approved by the attending provider.

## 2016-09-07 NOTE — Progress Notes (Signed)
  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  Identity was confirmed.  All relevant records and images related to the planned course of therapy were reviewed.  The patient freely provided informed written consent to proceed with treatment after reviewing the details related to the planned course of therapy. The consent form was witnessed and verified by the simulation staff.  Then, the patient was set-up in a stable reproducible  supine position for radiation therapy.  CT images were obtained.  Surface markings were placed.  The CT images were loaded into the planning software.  Then the target and avoidance structures were contoured.  Treatment planning then occurred.  The radiation prescription was entered and confirmed.   I have requested : Brachytherapy Isodose Plan and Dosimetry Calculations to plan the radiation distribution.    PLAN:  The patient will receive 30 Gy in 5 fractions. Treated with 2.5 diameter cylinder to a treatment length of 4 cm, prescription will be to the mucosal surface. Iridium 192 will be the high dose rate source.  ________________________________  Blair Promise, PhD, MD   This document serves as a record of services personally performed by Gery Pray, MD. It was created on his behalf by Arlyce Harman, a trained medical scribe. The creation of this record is based on the scribe's personal observations and the provider's statements to them. This document has been checked and approved by the attending provider.

## 2016-09-07 NOTE — Progress Notes (Signed)
Misty [name] is here for follow up HDR.  She denies having any pain.  She denies having any urinary issues or vaginal bleeding/discharge.  She reports having some constipation with her last bm this morning.  Her last chemotherapy infusion was on 08/25/16.  She reports having a good appetite and energy level.    BP (!) 144/97 (BP Location: Right Arm, Patient Position: Sitting)   Pulse 69   Temp 98.1 F (36.7 C) (Oral)   Ht 5\' 4"  (1.626 m)   Wt 296 lb 12.8 oz (134.6 kg)   LMP 05/31/2016 (Approximate)   SpO2 100%   BMI 50.95 kg/m    Wt Readings from Last 3 Encounters:  09/07/16 296 lb 12.8 oz (134.6 kg)  08/25/16 294 lb 4.8 oz (133.5 kg)  08/03/16 (!) 304 lb (137.9 kg)

## 2016-09-07 NOTE — Progress Notes (Signed)
Please see the Nurse Progress Note in the MD Initial Consult Encounter for this patient. 

## 2016-09-07 NOTE — Progress Notes (Signed)
Radiation Oncology         [phone] ________________________________  Name: Misty [name] MRN: [id number]  Date: 09/07/2016  DOB: 01-05-1961  Vaginal Brachytherapy Procedure Note  CC: Patient, No Pcp Per Everitt Amber, MD    ICD-10-CM   1. 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 She denies having any pain. She denies having any urinary issues or vaginal bleeding/discharge. She reports having some constipation with her last bowel movement this morning. She takes Colace to manage constipation. Her last chemotherapy was on 08/25/2016. She has had two cycles so far. She reports having a good appetite and energy level.   ALLERGIES: has No Known Allergies.  Meds: Current Outpatient Prescriptions  Medication Sig Dispense Refill  . ALPRAZolam (XANAX) 0.5 MG tablet Take 0.25-0.5 mg by mouth daily as needed for anxiety (FOR PANIC/ANXIETY).     . busPIRone (BUSPAR) 15 MG tablet Take 7.5 mg by mouth daily.     Marland Kitchen dexamethasone (DECADRON) 4 MG tablet Take 5 tablets the day before chemo and 5 tablets at 6 am the day of chemo with food, every 3 weeks 30 tablet 1  . escitalopram (LEXAPRO) 20 MG tablet Take 10 mg by mouth daily.     Marland Kitchen lidocaine-prilocaine (EMLA) cream Apply 1 application topically as needed. 30 g 3  . lidocaine-prilocaine (EMLA) cream Apply to affected area once 30 g 3  . ondansetron (ZOFRAN) 8 MG tablet Take 1 tablet (8 mg total) by mouth 2 (two) times daily as needed for refractory nausea / vomiting. Start on day 3 after chemo. 30 tablet 1  . prochlorperazine (COMPAZINE) 10 MG tablet Take 1 tablet (10 mg total) by mouth every 6 (six) hours as needed (Nausea or vomiting). (Patient not taking: Reported on 09/07/2016) 30 tablet 1   No current facility-administered medications for this encounter.     Physical Findings: The patient is in no acute distress. Patient  is alert and oriented.  height is 5\' 4"  (1.626 m) and weight is 296 lb 12.8 oz (134.6 kg). Her oral temperature is 98.1 F (36.7 C). Her blood pressure is 144/97 (abnormal) and her pulse is 69. Her oxygen saturation is 100%.   No palpable cervical, supraclavicular or axillary lymphoadenopathy. The heart has a regular rate and rhythm. The lungs are clear to auscultation. Abdomen soft and non-tender.  On pelvic examination the external genitalia were unremarkable. A speculum exam was performed. Vaginal cuff is difficult to see but no obvious mucosal lesions, . On bimanual exam there were no pelvic masses appreciated, vaginal cuff intact.  Lab Findings: Lab Results  Component Value Date   WBC 10.4 (H) 08/25/2016   HGB 12.7 08/25/2016   HCT 39.1 08/25/2016   MCV 78.5 (L) 08/25/2016   PLT 314 08/25/2016    Radiographic Findings: No results found.  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 The patient will be treated with a 2.5 cm diameter cylinder with a treatment length of 4 cm. This distended the vaginal vault without undue discomfort. The patient tolerated the procedure well.  The patient was successfully fitted for a vaginal cylinder. The patient is appropriate to begin vaginal brachytherapy.   Plan: The patient will proceed with CT simulation and vaginal brachytherapy today.    _______________________________   Blair Promise, PhD, MD  This document serves  as a record of services personally performed by Gery Pray, MD. It was created on his behalf by Arlyce Harman, a trained medical scribe. The creation of this record is based on the scribe's personal observations and the provider's statements to them. This document has been checked and approved by the attending provider.

## 2016-09-15 ENCOUNTER — Telehealth: Payer: Self-pay | Admitting: *Deleted

## 2016-09-15 NOTE — Telephone Encounter (Signed)
CALLED PATIENT TO REMIND OF HDR [place] 09-16-16  @ 1 PM, LVM FOR A RETURN CALL

## 2016-09-16 ENCOUNTER — Ambulatory Visit
Admission: RE | Admit: 2016-09-16 | Discharge: 2016-09-16 | Disposition: A | Discharge status: Home / Self Care | Payer: Managed Care, Other (non HMO) | Source: Ambulatory Visit | Attending: Radiation Oncology | Admitting: Radiation Oncology

## 2016-09-16 DIAGNOSIS — C541 Malignant neoplasm of endometrium: Secondary | ICD-10-CM | POA: Diagnosis not present

## 2016-09-16 NOTE — Progress Notes (Signed)
  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 She will be treated with a 2.5 cm diameter segmented cylinder. This conforms to her anatomy without undue discomfort.  Vaginal brachytherapy procedure node: The patient was brought to the [place] suite. Identity was confirmed. All relevant records and images related to the planned course of therapy were reviewed. The patient freely provided informed written consent to proceed with treatment after reviewing the details related to the planned course of therapy. The consent form was witnessed and verified by the simulation staff. Then, the patient was set-up in a stable reproducible supine position for radiation therapy. Pelvic exam revealed the vaginal cuff to be intact. The patient's custom vaginal cylinder was placed in the proximal vagina. This was affixed to the CT/MR stabilization plate to prevent slippage. Patient tolerated the placement well.  Verification simulation note:  A fiducial marker was placed within the vaginal cylinder. An AP and lateral film was then obtained through the pelvis area. This documented accurate position of the vaginal cylinder for treatment.  HDR BRACHYTHERAPY TREATMENT  The remote afterloading device was affixed to the vaginal cylinder by catheter. Patient then proceeded to undergo her second high-dose-rate treatment directed at the proximal vagina. The patient was prescribed a dose of 6 gray to be delivered to the mucosal surface. Treatment length was 4 cm. Patient was treated with 1 channel using 9 dwell positions. Treatment time was 264.6 seconds. Iridium 192 was the high-dose-rate source for treatment.  The patient tolerated the treatment well. After completion of her therapy, a radiation survey was performed documenting return of the iridium source into the GammaMed safe.   PLAN: The patient will return for third HDR treatment next Wednesday, 09/23/2016.  ________________________________  Blair Promise, PhD, MD   This document serves as a record of services personally performed by Gery Pray, MD. It was created on his behalf by Arlyce Harman, a trained medical scribe. The creation of this record is based on the scribe's personal observations and the provider's statements to them. This document has been checked and approved by the attending provider.

## 2016-09-17 ENCOUNTER — Other Ambulatory Visit (HOSPITAL_BASED_OUTPATIENT_CLINIC_OR_DEPARTMENT_OTHER): Payer: Managed Care, Other (non HMO)

## 2016-09-17 ENCOUNTER — Telehealth: Payer: Self-pay | Admitting: Hematology and Oncology

## 2016-09-17 ENCOUNTER — Encounter: Payer: Self-pay | Admitting: Hematology and Oncology

## 2016-09-17 ENCOUNTER — Ambulatory Visit: Payer: Managed Care, Other (non HMO)

## 2016-09-17 ENCOUNTER — Ambulatory Visit (HOSPITAL_BASED_OUTPATIENT_CLINIC_OR_DEPARTMENT_OTHER): Payer: Managed Care, Other (non HMO) | Admitting: Hematology and Oncology

## 2016-09-17 ENCOUNTER — Ambulatory Visit (HOSPITAL_BASED_OUTPATIENT_CLINIC_OR_DEPARTMENT_OTHER): Payer: Managed Care, Other (non HMO)

## 2016-09-17 VITALS — BP 190/98

## 2016-09-17 DIAGNOSIS — Z95828 Presence of other vascular implants and grafts: Secondary | ICD-10-CM

## 2016-09-17 DIAGNOSIS — C541 Malignant neoplasm of endometrium: Secondary | ICD-10-CM

## 2016-09-17 DIAGNOSIS — Z5111 Encounter for antineoplastic chemotherapy: Secondary | ICD-10-CM | POA: Diagnosis not present

## 2016-09-17 DIAGNOSIS — R030 Elevated blood-pressure reading, without diagnosis of hypertension: Secondary | ICD-10-CM | POA: Diagnosis not present

## 2016-09-17 DIAGNOSIS — C775 Secondary and unspecified malignant neoplasm of intrapelvic lymph nodes: Secondary | ICD-10-CM

## 2016-09-17 LAB — CBC WITH DIFFERENTIAL/PLATELET
BASO%: 0.2 % (ref 0.0–2.0)
BASOS ABS: 0 10*3/uL (ref 0.0–0.1)
EOS%: 0 % (ref 0.0–7.0)
Eosinophils Absolute: 0 10*3/uL (ref 0.0–0.5)
HEMATOCRIT: 37.6 % (ref 34.8–46.6)
HGB: 12.2 g/dL (ref 11.6–15.9)
LYMPH%: 4.8 % — AB (ref 14.0–49.7)
MCH: 25.7 pg (ref 25.1–34.0)
MCHC: 32.5 g/dL (ref 31.5–36.0)
MCV: 79.1 fL — ABNORMAL LOW (ref 79.5–101.0)
MONO#: 0.1 10*3/uL (ref 0.1–0.9)
MONO%: 1.1 % (ref 0.0–14.0)
NEUT#: 11.5 10*3/uL — ABNORMAL HIGH (ref 1.5–6.5)
NEUT%: 93.9 % — AB (ref 38.4–76.8)
Platelets: 271 10*3/uL (ref 145–400)
RBC: 4.75 10*6/uL (ref 3.70–5.45)
RDW: 20.5 % — ABNORMAL HIGH (ref 11.2–14.5)
WBC: 12.3 10*3/uL — ABNORMAL HIGH (ref 3.9–10.3)
lymph#: 0.6 10*3/uL — ABNORMAL LOW (ref 0.9–3.3)

## 2016-09-17 LAB — COMPREHENSIVE METABOLIC PANEL
ALT: 11 U/L (ref 0–55)
AST: 12 U/L (ref 5–34)
Albumin: 3.4 g/dL — ABNORMAL LOW (ref 3.5–5.0)
Alkaline Phosphatase: 75 U/L (ref 40–150)
Anion Gap: 9 mEq/L (ref 3–11)
BUN: 12.5 mg/dL (ref 7.0–26.0)
CALCIUM: 9.3 mg/dL (ref 8.4–10.4)
CHLORIDE: 107 meq/L (ref 98–109)
CO2: 23 mEq/L (ref 22–29)
Creatinine: 0.6 mg/dL (ref 0.6–1.1)
EGFR: >90 mL/min/{1.73_m2} (ref >90)
Glucose: 110 mg/dl (ref 70–140)
POTASSIUM: 4.3 meq/L (ref 3.5–5.1)
SODIUM: 139 meq/L (ref 136–145)
Total Bilirubin: 0.31 mg/dL (ref 0.20–1.20)
Total Protein: 7.2 g/dL (ref 6.4–8.3)

## 2016-09-17 MED ORDER — SODIUM CHLORIDE 0.9% FLUSH
10.0000 mL | INTRAVENOUS | Status: DC | PRN
  Administered 2016-09-17: 10 mL
  Filled 2016-09-17: qty 10

## 2016-09-17 MED ORDER — SODIUM CHLORIDE 0.9 % IV SOLN
Freq: Once | INTRAVENOUS | Status: AC
  Administered 2016-09-17: 11:00:00 via INTRAVENOUS
  Filled 2016-09-17: qty 5

## 2016-09-17 MED ORDER — DIPHENHYDRAMINE HCL 50 MG/ML IJ SOLN
INTRAMUSCULAR | Status: AC
  Filled 2016-09-17: qty 1

## 2016-09-17 MED ORDER — FAMOTIDINE IN NACL 20-0.9 MG/50ML-% IV SOLN
20.0000 mg | Freq: Once | INTRAVENOUS | Status: AC
  Administered 2016-09-17: 20 mg via INTRAVENOUS

## 2016-09-17 MED ORDER — SODIUM CHLORIDE 0.9 % IV SOLN
750.0000 mg | Freq: Once | INTRAVENOUS | Status: AC
  Administered 2016-09-17: 750 mg via INTRAVENOUS
  Filled 2016-09-17: qty 75

## 2016-09-17 MED ORDER — PALONOSETRON HCL INJECTION 0.25 MG/5ML
0.2500 mg | Freq: Once | INTRAVENOUS | Status: AC
  Administered 2016-09-17: 0.25 mg via INTRAVENOUS

## 2016-09-17 MED ORDER — FAMOTIDINE IN NACL 20-0.9 MG/50ML-% IV SOLN
INTRAVENOUS | Status: AC
  Filled 2016-09-17: qty 50

## 2016-09-17 MED ORDER — HEPARIN SOD (PORK) LOCK FLUSH 100 UNIT/ML IV SOLN
500.0000 [IU] | Freq: Once | INTRAVENOUS | Status: AC | PRN
  Administered 2016-09-17: 500 [IU]
  Filled 2016-09-17: qty 5

## 2016-09-17 MED ORDER — PALONOSETRON HCL INJECTION 0.25 MG/5ML
INTRAVENOUS | Status: AC
  Filled 2016-09-17: qty 5

## 2016-09-17 MED ORDER — DIPHENHYDRAMINE HCL 50 MG/ML IJ SOLN
50.0000 mg | Freq: Once | INTRAMUSCULAR | Status: AC
  Administered 2016-09-17: 50 mg via INTRAVENOUS

## 2016-09-17 MED ORDER — SODIUM CHLORIDE 0.9% FLUSH
10.0000 mL | INTRAVENOUS | Status: DC | PRN
  Administered 2016-09-17: 10 mL via INTRAVENOUS
  Filled 2016-09-17: qty 10

## 2016-09-17 MED ORDER — SODIUM CHLORIDE 0.9 % IV SOLN
Freq: Once | INTRAVENOUS | Status: AC
  Administered 2016-09-17: 11:00:00 via INTRAVENOUS

## 2016-09-17 MED ORDER — PACLITAXEL CHEMO INJECTION 300 MG/50ML
175.0000 mg/m2 | Freq: Once | INTRAVENOUS | Status: AC
  Administered 2016-09-17: 432 mg via INTRAVENOUS
  Filled 2016-09-17: qty 72

## 2016-09-17 NOTE — Telephone Encounter (Signed)
Gave patient AVS and calendar of upcoming September appointments.  °

## 2016-09-17 NOTE — Progress Notes (Signed)
Per Dr. Alvy Bimler, it's OK to treat with today's blood pressure. Infusion room nurse notified.

## 2016-09-17 NOTE — Assessment & Plan Note (Signed)
Her blood pressure is elevated but she attributed that to anxiety We will continue to monitor her blood pressure closely

## 2016-09-17 NOTE — Progress Notes (Signed)
[place] OFFICE PROGRESS NOTE  Patient Care Team: Patient, No Pcp Per as PCP - General (General Practice)  SUMMARY OF ONCOLOGIC HISTORY: Oncology History   MSI instability not detected     Endometrial cancer ([place])   05/19/2016 Initial Diagnosis    The patient reports abnormal uterine bleeding for approximately 6 years (2-4 bleeding episodes per year). She had a particularly heavy bleeding episode on 05/21/16 and was seen in the ED. Hb was 7.2g/dL. She was started on Megace. Her bleeding improved.       05/29/2016 Imaging    [place] on 05/29/16 showed a uterus with 9x6x5.7cm dimensions and an endometrial stripe of 68m.       06/05/2016 Procedure    Endometrial pipelle biopsy on 06/05/16 showed a grade 2 endometrial cancer       06/25/2016 Pathology Results    1. Lymph node, sentinel, biopsy, right external iliac METASTATIC ADENOCARCINOMA IN ONE OF ONE LYMPH NODE (1/1) 2. Lymph node, sentinel, biopsy, right obturator METASTATIC ADENOCARCINOMA IN FOUR OF FOUR LYMPH NODES (4/4) 3. Uterus +/- tubes/ovaries, neoplastic, cervix ENDOMETRIOID ADENOCARCINOMA, FIGO GRADE 3, THE TUMOR INVADES OUTER HALF OF THE MYOMETRIUM (PT1B) ALL MARGINS OF RESECTION ARE NEGATIVE FOR TUMOR BENIGN BILATERAL OVARIES AND FALLOPIAN TUBES Microscopic Comment 3. ONCOLOGY TABLE-UTERUS, CARCINOMA OR CARCINOSARCOMA  Specimen: Uterus, bilateral fallopian tubes and ovaries Procedure: Hysterectomy bilateral salpingo-oophorectomy Lymph node sampling performed: Yes Specimen integrity: Intact Maximum tumor size: 6.2 cm Histologic type: Endometrioid adenocarcinoma Grade: 3 Myometrial invasion: 1.5 cm where myometrium is 2.6 cm in thickness Cervical stromal involvement: Negative Extent of involvement of other organs: Negative Lymph - vascular invasion: Identified Peritoneal washings: Not performed Lymph nodes: Examined: 5 Sentinel 0 Non-sentinel 5 Total Lymph nodes with metastasis: 5 Isolated tumor  cells (< 0.2 mm): 0 Micrometastasis: (> 0.2 mm and < 2.0 mm): 2 Macrometastasis: (> 2.0 mm): 3 Extracapsular extension: 1 Pelvic lymph nodes: 5 involved of 5 lymph nodes. Para-aortic lymph nodes: NA involved of NA lymph nodes. Other (specify involvement and site): NA TNM code: pT1b, pN1 FIGO Stage (based on pathologic findings, needs clinical correlation): IIIC1       06/25/2016 Surgery    Surgeon: RDonaciano Eva  Operation: Robotic-assisted laparoscopic total hysterectomy uterus >250gm with bilateral salpingoophorectomy, SLN biopsy  Operative Findings:  : 12cm uterus, normal tubes and ovaries. Large volume visceral adiposity limiting exposure and visualization and increasing the duration of the procedure.        07/08/2016 Imaging    CT scan of chest, abdomen and pelvis 1. Status post hysterectomy and bilateral salpingo-oophorectomy. No postoperative complicating features such as abnormal fluid collection or hematoma. 2. Small bilateral pelvic lymph nodes are indeterminate but recommend surveillance. 3. No findings for metastatic disease involving the chest or abdomen. 4. Moderate to large hiatal hernia. 5. Age advanced atherosclerotic calcifications involving the aorta and iliac arteries.      07/31/2016 Procedure    Successful placement of a right IJ approach Power Port with ultrasound and fluoroscopic guidance. The catheter is ready for use      08/04/2016 -  Chemotherapy    She received carboplatin and taxol      08/24/2016 Genetic Testing    MET c.769A>G VUS identified on the multi-gene cancer panel.  The Multi-Gene Panel offered by [facility] includes sequencing and/or deletion duplication testing of the following 80 genes: ALK, APC, ATM, AXIN2,BAP1,  BARD1, BLM, BMPR1A, BRCA1, BRCA2, BRIP1, CASR, CDC73, CDH1, CDK4, CDKN1B, CDKN1C, CDKN2A (p14ARF), CDKN2A (  p16INK4a), CEBPA, CHEK2, CTNNA1, DICER1, DIS3L2, EGFR (c.2369C>T, p.Thr790Met variant only), EPCAM  (Deletion/duplication testing only), FH, FLCN, GATA2, GPC3, GREM1 (Promoter region deletion/duplication testing only), HOXB13 (c.251G>A, p.Gly84Glu), HRAS, KIT, MAX, MEN1, MET, MITF (c.952G>A, p.Glu318Lys variant only), MLH1, MSH2, MSH3, MSH6, MUTYH, NBN, NF1, NF2, NTHL1, PALB2, PDGFRA, PHOX2B, PMS2, POLD1, POLE, POT1, PRKAR1A, PTCH1, PTEN, RAD50, RAD51C, RAD51D, RB1, RECQL4, RET, RUNX1, SDHAF2, SDHA (sequence changes only), SDHB, SDHC, SDHD, SMAD4, SMARCA4, SMARCB1, SMARCE1, STK11, SUFU, TERT, TERT, TMEM127, TP53, TSC1, TSC2, VHL, WRN and WT1.  The report date is August 24, 2016.       09/07/2016 -  Radiation Therapy    She started brachytherapy        INTERVAL HISTORY: Please see below for problem oriented charting. She returns prior to cycle 3 of treatment She feels well Denies peripheral neuropathy No abdominal pain, nausea or vomiting Denies changes in bowel habits  REVIEW OF SYSTEMS:   Constitutional: Denies fevers, chills or abnormal weight loss Eyes: Denies blurriness of vision Ears, nose, mouth, throat, and face: Denies mucositis or sore throat Respiratory: Denies cough, dyspnea or wheezes Cardiovascular: Denies palpitation, chest discomfort or lower extremity swelling Gastrointestinal:  Denies nausea, heartburn or change in bowel habits Skin: Denies abnormal skin rashes Lymphatics: Denies new lymphadenopathy or easy bruising Neurological:Denies numbness, tingling or new weaknesses Behavioral/Psych: Mood is stable, no new changes  All other systems were reviewed with the patient and are negative.  I have reviewed the past medical history, past surgical history, social history and family history with the patient and they are unchanged from previous note.  ALLERGIES:  has No Known Allergies.  MEDICATIONS:  Current Outpatient Prescriptions  Medication Sig Dispense Refill  . ALPRAZolam (XANAX) 0.5 MG tablet Take 0.25-0.5 mg by mouth daily as needed for anxiety (FOR  PANIC/ANXIETY).     . busPIRone (BUSPAR) 15 MG tablet Take 7.5 mg by mouth daily.     Marland Kitchen dexamethasone (DECADRON) 4 MG tablet Take 5 tablets the day before chemo and 5 tablets at 6 am the day of chemo with food, every 3 weeks 30 tablet 1  . escitalopram (LEXAPRO) 20 MG tablet Take 10 mg by mouth daily.     Marland Kitchen lidocaine-prilocaine (EMLA) cream Apply 1 application topically as needed. 30 g 3  . lidocaine-prilocaine (EMLA) cream Apply to affected area once 30 g 3  . ondansetron (ZOFRAN) 8 MG tablet Take 1 tablet (8 mg total) by mouth 2 (two) times daily as needed for refractory nausea / vomiting. Start on day 3 after chemo. 30 tablet 1  . prochlorperazine (COMPAZINE) 10 MG tablet Take 1 tablet (10 mg total) by mouth every 6 (six) hours as needed (Nausea or vomiting). (Patient not taking: Reported on 09/07/2016) 30 tablet 1   No current facility-administered medications for this visit.    Facility-Administered Medications Ordered in Other Visits  Medication Dose Route Frequency Provider Last Rate Last Dose  . 0.9 %  sodium chloride infusion   Intravenous Once Vannia Pola, MD      . CARBOplatin (PARAPLATIN) 750 mg in sodium chloride 0.9 % 250 mL chemo infusion  750 mg Intravenous Once Alvy Bimler, Blong Busk, MD      . diphenhydrAMINE (BENADRYL) injection 50 mg  50 mg Intravenous Once Alvy Bimler, Najwa Spillane, MD      . famotidine (PEPCID) IVPB 20 mg premix  20 mg Intravenous Once Amen Dargis, MD      . fosaprepitant (EMEND) 150 mg, dexamethasone (DECADRON) 12 mg in sodium chloride 0.9 %  145 mL IVPB   Intravenous Once Alvy Bimler, Fairy Ashlock, MD      . heparin lock flush 100 unit/mL  500 Units Intracatheter Once PRN Alvy Bimler, Brandi Armato, MD      . PACLitaxel (TAXOL) 432 mg in dextrose 5 % 500 mL chemo infusion (> '80mg'$ /m2)  175 mg/m2 (Treatment Plan Recorded) Intravenous Once Alvy Bimler, Mialee Weyman, MD      . palonosetron (ALOXI) injection 0.25 mg  0.25 mg Intravenous Once Mihira Tozzi, MD      . sodium chloride flush (NS) 0.9 % injection 10 mL  10 mL  Intracatheter PRN Alvy Bimler, Liese Dizdarevic, MD        PHYSICAL EXAMINATION: ECOG PERFORMANCE STATUS: 0 - Asymptomatic  Vitals:   09/17/16 0948  BP: (!) 198/113  Pulse: 70  Resp: 20  Temp: 98.2 F (36.8 C)  SpO2: 99%   Filed Weights   09/17/16 0948  Weight: (!) 300 lb 4.8 oz (136.2 kg)    GENERAL:alert, no distress and comfortable.  She is morbidly obese SKIN: skin color, texture, turgor are normal, no rashes or significant lesions EYES: normal, Conjunctiva are pink and non-injected, sclera clear OROPHARYNX:no exudate, no erythema and lips, buccal mucosa, and tongue normal  NECK: supple, thyroid normal size, non-tender, without nodularity LYMPH:  no palpable lymphadenopathy in the cervical, axillary or inguinal LUNGS: clear to auscultation and percussion with normal breathing effort HEART: regular rate & rhythm and no murmurs and no lower extremity edema ABDOMEN:abdomen soft, non-tender and normal bowel sounds Musculoskeletal:no cyanosis of digits and no clubbing  NEURO: alert & oriented x 3 with fluent speech, no focal motor/sensory deficits  LABORATORY DATA:  I have reviewed the data as listed    Component Value Date/Time   NA 139 09/17/2016 0837   K 4.3 09/17/2016 0837   CL 105 07/31/2016 1146   CO2 23 09/17/2016 0837   GLUCOSE 110 09/17/2016 0837   BUN 12.5 09/17/2016 0837   CREATININE 0.6 09/17/2016 0837   CALCIUM 9.3 09/17/2016 0837   PROT 7.2 09/17/2016 0837   ALBUMIN 3.4 (L) 09/17/2016 0837   AST 12 09/17/2016 0837   ALT 11 09/17/2016 0837   ALKPHOS 75 09/17/2016 0837   BILITOT 0.31 09/17/2016 0837   GFRNONAA >60 07/31/2016 1146   GFRAA >60 07/31/2016 1146    No results found for: SPEP, UPEP  Lab Results  Component Value Date   WBC 12.3 (H) 09/17/2016   NEUTROABS 11.5 (H) 09/17/2016   HGB 12.2 09/17/2016   HCT 37.6 09/17/2016   MCV 79.1 (L) 09/17/2016   PLT 271 09/17/2016      Chemistry      Component Value Date/Time   NA 139 09/17/2016 0837   K 4.3  09/17/2016 0837   CL 105 07/31/2016 1146   CO2 23 09/17/2016 0837   BUN 12.5 09/17/2016 0837   CREATININE 0.6 09/17/2016 0837      Component Value Date/Time   CALCIUM 9.3 09/17/2016 0837   ALKPHOS 75 09/17/2016 0837   AST 12 09/17/2016 0837   ALT 11 09/17/2016 0837   BILITOT 0.31 09/17/2016 0837       ASSESSMENT & PLAN:  Endometrial cancer ([place]) She tolerated treatment well with expected side effects We will proceed with treatment without dose adjustment I will see her back prior to cycle 4 of treatment  White coat syndrome without diagnosis of hypertension Her blood pressure is elevated but she attributed that to anxiety We will continue to monitor her blood pressure closely   No  orders of the defined types were placed in this encounter.  All questions were answered. The patient knows to call the clinic with any problems, questions or concerns. No barriers to learning was detected. I spent 15 minutes counseling the patient face to face. The total time spent in the appointment was 20 minutes and more than 50% was on counseling and review of test results     Heath Lark, MD 09/17/2016 10:28 AM

## 2016-09-17 NOTE — Patient Instructions (Signed)
[facility] Discharge Instructions for Patients Receiving Chemotherapy  Today you received the following chemotherapy agents Taxol/Carboplatin To help prevent nausea and vomiting after your treatment, we encourage you to take your nausea medication as prescribed.   If you develop nausea and vomiting that is not controlled by your nausea medication, call the clinic.   BELOW ARE SYMPTOMS THAT SHOULD BE REPORTED IMMEDIATELY:  *FEVER GREATER THAN 100.5 F  *CHILLS WITH OR WITHOUT FEVER  NAUSEA AND VOMITING THAT IS NOT CONTROLLED WITH YOUR NAUSEA MEDICATION  *UNUSUAL SHORTNESS OF BREATH  *UNUSUAL BRUISING OR BLEEDING  TENDERNESS IN MOUTH AND THROAT WITH OR WITHOUT PRESENCE OF ULCERS  *URINARY PROBLEMS  *BOWEL PROBLEMS  UNUSUAL RASH Items with * indicate a potential emergency and should be followed up as soon as possible.  Feel free to call the clinic you have any questions or concerns. The clinic phone number is [phone].  Please show the CHEMO ALERT CARD at check-in to the Emergency Department and triage nurse.   

## 2016-09-17 NOTE — Assessment & Plan Note (Signed)
She tolerated treatment well with expected side effects We will proceed with treatment without dose adjustment I will see her back prior to cycle 4 of treatment

## 2016-09-18 ENCOUNTER — Telehealth: Payer: Self-pay

## 2016-09-18 NOTE — Telephone Encounter (Signed)
Pt called that yesterday she had #3/6 chemo. Changed to 9/6 for xrt.  She is asking about the Saturday appts. Explained the appt is for neulasta injection. Explained to go back to the infusion area on Saturday and she can drive herself.

## 2016-09-19 ENCOUNTER — Ambulatory Visit (HOSPITAL_BASED_OUTPATIENT_CLINIC_OR_DEPARTMENT_OTHER): Payer: Managed Care, Other (non HMO)

## 2016-09-19 VITALS — BP 146/87 | HR 73 | Temp 98.3°F | Resp 20

## 2016-09-19 DIAGNOSIS — C541 Malignant neoplasm of endometrium: Secondary | ICD-10-CM | POA: Diagnosis not present

## 2016-09-19 DIAGNOSIS — Z5189 Encounter for other specified aftercare: Secondary | ICD-10-CM | POA: Diagnosis not present

## 2016-09-19 DIAGNOSIS — C775 Secondary and unspecified malignant neoplasm of intrapelvic lymph nodes: Secondary | ICD-10-CM

## 2016-09-19 MED ORDER — PEGFILGRASTIM INJECTION 6 MG/0.6ML ~~LOC~~
6.0000 mg | PREFILLED_SYRINGE | Freq: Once | SUBCUTANEOUS | Status: AC
  Administered 2016-09-19: 6 mg via SUBCUTANEOUS

## 2016-09-19 NOTE — Patient Instructions (Signed)
Pegfilgrastim injection What is this medicine? PEGFILGRASTIM (PEG fil gra stim) is a long-acting granulocyte colony-stimulating factor that stimulates the growth of neutrophils, a type of white blood cell important in the body's fight against infection. It is used to reduce the incidence of fever and infection in patients with certain types of cancer who are receiving chemotherapy that affects the bone marrow, and to increase survival after being exposed to high doses of radiation. This medicine may be used for other purposes; ask your health care provider or pharmacist if you have questions. COMMON BRAND NAME(S): Neulasta What should I tell my health care provider before I take this medicine? They need to know if you have any of these conditions: -kidney disease -latex allergy -ongoing radiation therapy -sickle cell disease -skin reactions to acrylic adhesives (On-Body Injector only) -an unusual or allergic reaction to pegfilgrastim, filgrastim, other medicines, foods, dyes, or preservatives -pregnant or trying to get pregnant -breast-feeding How should I use this medicine? This medicine is for injection under the skin. If you get this medicine at home, you will be taught how to prepare and give the pre-filled syringe or how to use the On-body Injector. Refer to the patient Instructions for Use for detailed instructions. Use exactly as directed. Tell your healthcare provider immediately if you suspect that the On-body Injector may not have performed as intended or if you suspect the use of the On-body Injector resulted in a missed or partial dose. It is important that you put your used needles and syringes in a special sharps container. Do not put them in a trash can. If you do not have a sharps container, call your pharmacist or healthcare provider to get one. Talk to your pediatrician regarding the use of this medicine in children. While this drug may be prescribed for selected conditions,  precautions do apply. Overdosage: If you think you have taken too much of this medicine contact a poison control center or emergency room at once. NOTE: This medicine is only for you. Do not share this medicine with others. What if I miss a dose? It is important not to miss your dose. Call your doctor or health care professional if you miss your dose. If you miss a dose due to an On-body Injector failure or leakage, a new dose should be administered as soon as possible using a single prefilled syringe for manual use. What may interact with this medicine? Interactions have not been studied. Give your health care provider a list of all the medicines, herbs, non-prescription drugs, or dietary supplements you use. Also tell them if you smoke, drink alcohol, or use illegal drugs. Some items may interact with your medicine. This list may not describe all possible interactions. Give your health care provider a list of all the medicines, herbs, non-prescription drugs, or dietary supplements you use. Also tell them if you smoke, drink alcohol, or use illegal drugs. Some items may interact with your medicine. What should I watch for while using this medicine? You may need blood work done while you are taking this medicine. If you are going to need a MRI, CT scan, or other procedure, tell your doctor that you are using this medicine (On-Body Injector only). What side effects may I notice from receiving this medicine? Side effects that you should report to your doctor or health care professional as soon as possible: -allergic reactions like skin rash, itching or hives, swelling of the face, lips, or tongue -dizziness -fever -pain, redness, or irritation at site   where injected -pinpoint red spots on the skin -red or dark-brown urine -shortness of breath or breathing problems -stomach or side pain, or pain at the shoulder -swelling -tiredness -trouble passing urine or change in the amount of urine Side  effects that usually do not require medical attention (report to your doctor or health care professional if they continue or are bothersome): -bone pain -muscle pain This list may not describe all possible side effects. Call your doctor for medical advice about side effects. You may report side effects to FDA at 1-800-FDA-1088. Where should I keep my medicine? Keep out of the reach of children. Store pre-filled syringes in a refrigerator between 2 and 8 degrees C (36 and 46 degrees F). Do not freeze. Keep in carton to protect from light. Throw away this medicine if it is left out of the refrigerator for more than 48 hours. Throw away any unused medicine after the expiration date. NOTE: This sheet is a summary. It may not cover all possible information. If you have questions about this medicine, talk to your doctor, pharmacist, or health care provider.  2018 Elsevier/Gold Standard (2015-12-26 12:58:03)  

## 2016-09-22 ENCOUNTER — Telehealth: Payer: Self-pay | Admitting: *Deleted

## 2016-09-22 NOTE — Telephone Encounter (Signed)
CALLED PATIENT TO REMIND OF [facility] [place] 09-23-16 @ 10 AM, LVM FOR A RETURN CALL

## 2016-09-23 ENCOUNTER — Ambulatory Visit
Admission: RE | Admit: 2016-09-23 | Discharge: 2016-09-23 | Disposition: A | Discharge status: Home / Self Care | Payer: Managed Care, Other (non HMO) | Source: Ambulatory Visit | Attending: Radiation Oncology | Admitting: Radiation Oncology

## 2016-09-23 DIAGNOSIS — C541 Malignant neoplasm of endometrium: Secondary | ICD-10-CM

## 2016-09-23 NOTE — Progress Notes (Signed)
  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 She will be treated with a 2.5 cm diameter segmented cylinder. This conforms to her anatomy without undue discomfort.  Vaginal brachytherapy procedure node: The patient was brought to the [place] suite. Identity was confirmed. All relevant records and images related to the planned course of therapy were reviewed. The patient freely provided informed written consent to proceed with treatment after reviewing the details related to the planned course of therapy. The consent form was witnessed and verified by the simulation staff. Then, the patient was set-up in a stable reproducible supine position for radiation therapy. Pelvic exam revealed the vaginal cuff to be intact. The patient's custom vaginal cylinder was placed in the proximal vagina. This was affixed to the CT/MR stabilization plate to prevent slippage. Patient tolerated the placement well.  Verification simulation note:  A fiducial marker was placed within the vaginal cylinder. An AP and lateral film was then obtained through the pelvis area. This documented accurate position of the vaginal cylinder for treatment.  HDR BRACHYTHERAPY TREATMENT  The remote afterloading device was affixed to the vaginal cylinder by catheter. Patient then proceeded to undergo her third high-dose-rate treatment directed at the proximal vagina. The patient was prescribed a dose of 6 gray to be delivered to the mucosal surface. Treatment length was 4 cm. Patient was treated with 1 channel using 9 dwell positions. Treatment time was 282.6 seconds. Iridium 192 was the high-dose-rate source for treatment.  The patient tolerated the treatment well. After completion of her therapy, a radiation survey was performed documenting return of the iridium source into the Gamma[employer] safe.   PLAN: The patient will return for fourth HDR treatment next Wednesday, 09/30/2016.  ________________________________   Blair Promise, PhD, MD   This document serves as a record of services personally performed by Gery Pray, MD. It was created on his behalf by [facility], a trained medical scribe. The creation of this record is based on the scribe's personal observations and the provider's statements to them. This document has been checked and approved by the attending provider.

## 2016-09-30 ENCOUNTER — Ambulatory Visit
Admission: RE | Admit: 2016-09-30 | Discharge: 2016-09-30 | Disposition: A | Discharge status: Home / Self Care | Payer: Managed Care, Other (non HMO) | Source: Ambulatory Visit | Attending: Radiation Oncology | Admitting: Radiation Oncology

## 2016-09-30 DIAGNOSIS — C541 Malignant neoplasm of endometrium: Secondary | ICD-10-CM

## 2016-09-30 NOTE — Progress Notes (Signed)
  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 She will be treated with a 2.5 cm diameter segmented cylinder. This conforms to her anatomy without undue discomfort.  Vaginal brachytherapy procedure node: The patient was brought to the [place] suite. Identity was confirmed. All relevant records and images related to the planned course of therapy were reviewed. The patient freely provided informed written consent to proceed with treatment after reviewing the details related to the planned course of therapy. The consent form was witnessed and verified by the simulation staff. Then, the patient was set-up in a stable reproducible supine position for radiation therapy. Pelvic exam revealed the vaginal cuff to be intact. The patient's custom vaginal cylinder was placed in the proximal vagina. This was affixed to the CT/MR stabilization plate to prevent slippage. Patient tolerated the placement well.  Verification simulation note:  A fiducial marker was placed within the vaginal cylinder. An AP and lateral film was then obtained through the pelvis area. This documented accurate position of the vaginal cylinder for treatment.  HDR BRACHYTHERAPY TREATMENT  The remote afterloading device was affixed to the vaginal cylinder by catheter. Patient then proceeded to undergo her fourth high-dose-rate treatment directed at the proximal vagina. The patient was prescribed a dose of 6 gray to be delivered to the mucosal surface. Treatment length was 4 cm. Patient was treated with 1 channel using 9 dwell positions. Treatment time was 301.8 seconds. Iridium 192 was the high-dose-rate source for treatment.  The patient tolerated the treatment well. After completion of her therapy, a radiation survey was performed documenting return of the iridium source into the GammaMed safe.   PLAN: The patient will return for fifth HDR treatment next Wednesday, 10/07/2016.  ________________________________   Blair Promise, PhD, MD   This document serves as a record of services personally performed by Gery Pray, MD. It was created on his behalf by [facility], a trained medical scribe. The creation of this record is based on the scribe's personal observations and the provider's statements to them. This document has been checked and approved by the attending provider.

## 2016-10-02 ENCOUNTER — Telehealth: Payer: Self-pay | Admitting: *Deleted

## 2016-10-02 NOTE — Telephone Encounter (Signed)
On 10-02-16 fax medical records to [facility] it was consult note.

## 2016-10-06 ENCOUNTER — Telehealth: Payer: Self-pay | Admitting: *Deleted

## 2016-10-06 NOTE — Telephone Encounter (Signed)
CALLED PATIENT TO REMIND OF [facility] [place] 10-07-16 @ 10 AM, LVM FOR A RETURN CALL

## 2016-10-07 ENCOUNTER — Ambulatory Visit
Admission: RE | Admit: 2016-10-07 | Discharge: 2016-10-07 | Disposition: A | Discharge status: Home / Self Care | Payer: Managed Care, Other (non HMO) | Source: Ambulatory Visit | Attending: Radiation Oncology | Admitting: Radiation Oncology

## 2016-10-07 ENCOUNTER — Encounter: Payer: Self-pay | Admitting: Radiation Oncology

## 2016-10-07 DIAGNOSIS — C541 Malignant neoplasm of endometrium: Secondary | ICD-10-CM

## 2016-10-07 NOTE — Progress Notes (Signed)
  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  Narrative: The patient tolerated radiation treatment relatively well. Patient denied any pain, urinary issues, or vaginal bleeding/discharge.  Plan: The patient has completed radiation treatment. The patient will return to radiation oncology clinic for routine followup in one month. I advised them to call or return sooner if they have any questions or concerns related to their recovery or treatment.  -----------------------------------  Blair Promise, PhD, MD  This document serves as a record of services personally performed by Gery Pray, MD. It was created on his behalf by Bethann Humble, a trained medical scribe. The creation of this record is based on the scribe's personal observations and the provider's statements to them. This document has been checked and approved by the attending provider.

## 2016-10-07 NOTE — Progress Notes (Signed)
  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 She will be treated with a 2.5 cm diameter segmented cylinder. This conforms to her anatomy without undue discomfort.  Vaginal brachytherapy procedure node: The patient was brought to the [facility] suite. Identity was confirmed. All relevant records and images related to the planned course of therapy were reviewed. The patient freely provided informed written consent to proceed with treatment after reviewing the details related to the planned course of therapy. The consent form was witnessed and verified by the simulation staff. Then, the patient was set-up in a stable reproducible supine position for radiation therapy. Pelvic exam revealed the vaginal cuff to be intact. The patient's custom vaginal cylinder was placed in the proximal vagina. This was affixed to the CT/MR stabilization plate to prevent slippage. Patient tolerated the placement well.  Verification simulation note:  A fiducial marker was placed within the vaginal cylinder. An AP and lateral film was then obtained through the pelvis area. This documented accurate position of the vaginal cylinder for treatment.  HDR BRACHYTHERAPY TREATMENT  The remote afterloading device was affixed to the vaginal cylinder by catheter. Patient then proceeded to undergo her fifth high-dose-rate treatment directed at the proximal vagina. The patient was prescribed a dose of 6 gray to be delivered to the mucosal surface. Treatment length was 4 cm. Patient was treated with 1 channel using 9 dwell positions. Treatment time was 322.3 seconds. Iridium 192 was the high-dose-rate source for treatment.  The patient tolerated the treatment well. After completion of her therapy, a radiation survey was performed documenting return of the iridium source into the [employer] safe.   PLAN: The patient will return for follow-up in one month.  ________________________________   Blair Promise, PhD, MD   This document serves as a record of services personally performed by Gery Pray, MD. It was created on his behalf by Bethann Humble, a trained medical scribe. The creation of this record is based on the scribe's personal observations and the provider's statements to them. This document has been checked and approved by the attending provider.

## 2016-10-08 ENCOUNTER — Ambulatory Visit (HOSPITAL_BASED_OUTPATIENT_CLINIC_OR_DEPARTMENT_OTHER): Payer: Managed Care, Other (non HMO)

## 2016-10-08 ENCOUNTER — Telehealth: Payer: Self-pay | Admitting: Hematology and Oncology

## 2016-10-08 ENCOUNTER — Other Ambulatory Visit (HOSPITAL_BASED_OUTPATIENT_CLINIC_OR_DEPARTMENT_OTHER): Payer: Managed Care, Other (non HMO)

## 2016-10-08 ENCOUNTER — Telehealth: Payer: Self-pay

## 2016-10-08 ENCOUNTER — Other Ambulatory Visit: Payer: Self-pay | Admitting: Hematology and Oncology

## 2016-10-08 ENCOUNTER — Ambulatory Visit (HOSPITAL_BASED_OUTPATIENT_CLINIC_OR_DEPARTMENT_OTHER): Payer: Managed Care, Other (non HMO) | Admitting: Hematology and Oncology

## 2016-10-08 ENCOUNTER — Ambulatory Visit: Payer: Managed Care, Other (non HMO)

## 2016-10-08 DIAGNOSIS — G620 Drug-induced polyneuropathy: Secondary | ICD-10-CM | POA: Diagnosis not present

## 2016-10-08 DIAGNOSIS — C541 Malignant neoplasm of endometrium: Secondary | ICD-10-CM

## 2016-10-08 DIAGNOSIS — Z5111 Encounter for antineoplastic chemotherapy: Secondary | ICD-10-CM | POA: Diagnosis not present

## 2016-10-08 DIAGNOSIS — C775 Secondary and unspecified malignant neoplasm of intrapelvic lymph nodes: Secondary | ICD-10-CM

## 2016-10-08 DIAGNOSIS — Z23 Encounter for immunization: Secondary | ICD-10-CM

## 2016-10-08 DIAGNOSIS — Z299 Encounter for prophylactic measures, unspecified: Secondary | ICD-10-CM

## 2016-10-08 DIAGNOSIS — T451X5A Adverse effect of antineoplastic and immunosuppressive drugs, initial encounter: Secondary | ICD-10-CM

## 2016-10-08 LAB — CBC WITH DIFFERENTIAL/PLATELET
BASO%: 0.1 % (ref 0.0–2.0)
Basophils Absolute: 0 10*3/uL (ref 0.0–0.1)
EOS%: 0.1 % (ref 0.0–7.0)
Eosinophils Absolute: 0 10*3/uL (ref 0.0–0.5)
HCT: 37 % (ref 34.8–46.6)
HGB: 11.6 g/dL (ref 11.6–15.9)
LYMPH%: 6.1 % — AB (ref 14.0–49.7)
MCH: 26.5 pg (ref 25.1–34.0)
MCHC: 31.4 g/dL — AB (ref 31.5–36.0)
MCV: 84.5 fL (ref 79.5–101.0)
MONO#: 0.1 10*3/uL (ref 0.1–0.9)
MONO%: 1.3 % (ref 0.0–14.0)
NEUT%: 92.4 % — AB (ref 38.4–76.8)
NEUTROS ABS: 9.8 10*3/uL — AB (ref 1.5–6.5)
Platelets: 335 10*3/uL (ref 145–400)
RBC: 4.38 10*6/uL (ref 3.70–5.45)
RDW: 23.4 % — ABNORMAL HIGH (ref 11.2–14.5)
WBC: 10.6 10*3/uL — AB (ref 3.9–10.3)
lymph#: 0.7 10*3/uL — ABNORMAL LOW (ref 0.9–3.3)
nRBC: 0 % (ref 0–0)

## 2016-10-08 LAB — COMPREHENSIVE METABOLIC PANEL
ALT: 9 U/L (ref 0–55)
AST: 12 U/L (ref 5–34)
Albumin: 3.6 g/dL (ref 3.5–5.0)
Alkaline Phosphatase: 73 U/L (ref 40–150)
Anion Gap: 8 mEq/L (ref 3–11)
BUN: 13.9 mg/dL (ref 7.0–26.0)
CALCIUM: 9.3 mg/dL (ref 8.4–10.4)
CHLORIDE: 106 meq/L (ref 98–109)
CO2: 25 meq/L (ref 22–29)
CREATININE: 0.6 mg/dL (ref 0.6–1.1)
EGFR: >90 mL/min/{1.73_m2} (ref >90)
Glucose: 115 mg/dl (ref 70–140)
POTASSIUM: 4.1 meq/L (ref 3.5–5.1)
SODIUM: 139 meq/L (ref 136–145)
Total Bilirubin: 0.44 mg/dL (ref 0.20–1.20)
Total Protein: 7.4 g/dL (ref 6.4–8.3)

## 2016-10-08 MED ORDER — SODIUM CHLORIDE 0.9 % IV SOLN
Freq: Once | INTRAVENOUS | Status: AC
  Administered 2016-10-08: 12:00:00 via INTRAVENOUS

## 2016-10-08 MED ORDER — INFLUENZA VAC SPLIT QUAD 0.5 ML IM SUSY
0.5000 mL | PREFILLED_SYRINGE | Freq: Once | INTRAMUSCULAR | Status: AC
Start: 2016-10-08 — End: 2016-10-08
  Administered 2016-10-08: 0.5 mL via INTRAMUSCULAR
  Filled 2016-10-08: qty 0.5

## 2016-10-08 MED ORDER — PACLITAXEL CHEMO INJECTION 300 MG/50ML
175.0000 mg/m2 | Freq: Once | INTRAVENOUS | Status: AC
  Administered 2016-10-08: 432 mg via INTRAVENOUS
  Filled 2016-10-08: qty 72

## 2016-10-08 MED ORDER — HEPARIN SOD (PORK) LOCK FLUSH 100 UNIT/ML IV SOLN
500.0000 [IU] | Freq: Once | INTRAVENOUS | Status: AC | PRN
  Administered 2016-10-08: 500 [IU]
  Filled 2016-10-08: qty 5

## 2016-10-08 MED ORDER — SODIUM CHLORIDE 0.9 % IV SOLN
Freq: Once | INTRAVENOUS | Status: AC
  Administered 2016-10-08: 13:00:00 via INTRAVENOUS
  Filled 2016-10-08: qty 5

## 2016-10-08 MED ORDER — SODIUM CHLORIDE 0.9% FLUSH
10.0000 mL | INTRAVENOUS | Status: DC | PRN
  Administered 2016-10-08: 10 mL
  Filled 2016-10-08: qty 10

## 2016-10-08 MED ORDER — SODIUM CHLORIDE 0.9 % IV SOLN
750.0000 mg | Freq: Once | INTRAVENOUS | Status: AC
  Administered 2016-10-08: 750 mg via INTRAVENOUS
  Filled 2016-10-08: qty 75

## 2016-10-08 MED ORDER — FAMOTIDINE IN NACL 20-0.9 MG/50ML-% IV SOLN
INTRAVENOUS | Status: AC
  Filled 2016-10-08: qty 50

## 2016-10-08 MED ORDER — DIPHENHYDRAMINE HCL 50 MG/ML IJ SOLN
50.0000 mg | Freq: Once | INTRAMUSCULAR | Status: AC
  Administered 2016-10-08: 50 mg via INTRAVENOUS

## 2016-10-08 MED ORDER — DIPHENHYDRAMINE HCL 50 MG/ML IJ SOLN
INTRAMUSCULAR | Status: AC
  Filled 2016-10-08: qty 1

## 2016-10-08 MED ORDER — PALONOSETRON HCL INJECTION 0.25 MG/5ML
INTRAVENOUS | Status: AC
  Filled 2016-10-08: qty 5

## 2016-10-08 MED ORDER — PALONOSETRON HCL INJECTION 0.25 MG/5ML
0.2500 mg | Freq: Once | INTRAVENOUS | Status: AC
  Administered 2016-10-08: 0.25 mg via INTRAVENOUS

## 2016-10-08 MED ORDER — FAMOTIDINE IN NACL 20-0.9 MG/50ML-% IV SOLN
20.0000 mg | Freq: Once | INTRAVENOUS | Status: AC
  Administered 2016-10-08: 20 mg via INTRAVENOUS

## 2016-10-08 NOTE — Telephone Encounter (Signed)
Gave avs and calendar for October and November  °

## 2016-10-08 NOTE — Telephone Encounter (Signed)
Called and left a message for patient to come as soon as possible. Due to add ons. Per [facility] request 9/27 los

## 2016-10-09 ENCOUNTER — Encounter: Payer: Self-pay | Admitting: Hematology and Oncology

## 2016-10-09 DIAGNOSIS — Z299 Encounter for prophylactic measures, unspecified: Secondary | ICD-10-CM | POA: Insufficient documentation

## 2016-10-09 DIAGNOSIS — T451X5A Adverse effect of antineoplastic and immunosuppressive drugs, initial encounter: Secondary | ICD-10-CM

## 2016-10-09 DIAGNOSIS — G620 Drug-induced polyneuropathy: Secondary | ICD-10-CM | POA: Insufficient documentation

## 2016-10-09 NOTE — Assessment & Plan Note (Signed)
She has completed radiation treatment She tolerated treatment well except for very mild peripheral neuropathy We will proceed with treatment without dose adjustment

## 2016-10-09 NOTE — Progress Notes (Signed)
[place] OFFICE PROGRESS NOTE  Patient Care Team: Patient, No Pcp Per as PCP - General (General Practice)  SUMMARY OF ONCOLOGIC HISTORY: Oncology History   MSI instability not detected     Endometrial cancer ([place])   05/19/2016 Initial Diagnosis    The patient reports abnormal uterine bleeding for approximately 6 years (2-4 bleeding episodes per year). She had a particularly heavy bleeding episode on 05/21/16 and was seen in the ED. Hb was 7.2g/dL. She was started on Megace. Her bleeding improved.       05/29/2016 Imaging    [place] on 05/29/16 showed a uterus with 9x6x5.7cm dimensions and an endometrial stripe of 68m.       06/05/2016 Procedure    Endometrial pipelle biopsy on 06/05/16 showed a grade 2 endometrial cancer       06/25/2016 Pathology Results    1. Lymph node, sentinel, biopsy, right external iliac METASTATIC ADENOCARCINOMA IN ONE OF ONE LYMPH NODE (1/1) 2. Lymph node, sentinel, biopsy, right obturator METASTATIC ADENOCARCINOMA IN FOUR OF FOUR LYMPH NODES (4/4) 3. Uterus +/- tubes/ovaries, neoplastic, cervix ENDOMETRIOID ADENOCARCINOMA, FIGO GRADE 3, THE TUMOR INVADES OUTER HALF OF THE MYOMETRIUM (PT1B) ALL MARGINS OF RESECTION ARE NEGATIVE FOR TUMOR BENIGN BILATERAL OVARIES AND FALLOPIAN TUBES Microscopic Comment 3. ONCOLOGY TABLE-UTERUS, CARCINOMA OR CARCINOSARCOMA  Specimen: Uterus, bilateral fallopian tubes and ovaries Procedure: Hysterectomy bilateral salpingo-oophorectomy Lymph node sampling performed: Yes Specimen integrity: Intact Maximum tumor size: 6.2 cm Histologic type: Endometrioid adenocarcinoma Grade: 3 Myometrial invasion: 1.5 cm where myometrium is 2.6 cm in thickness Cervical stromal involvement: Negative Extent of involvement of other organs: Negative Lymph - vascular invasion: Identified Peritoneal washings: Not performed Lymph nodes: Examined: 5 Sentinel 0 Non-sentinel 5 Total Lymph nodes with metastasis: 5 Isolated tumor  cells (< 0.2 mm): 0 Micrometastasis: (> 0.2 mm and < 2.0 mm): 2 Macrometastasis: (> 2.0 mm): 3 Extracapsular extension: 1 Pelvic lymph nodes: 5 involved of 5 lymph nodes. Para-aortic lymph nodes: NA involved of NA lymph nodes. Other (specify involvement and site): NA TNM code: pT1b, pN1 FIGO Stage (based on pathologic findings, needs clinical correlation): IIIC1       06/25/2016 Surgery    Surgeon: RDonaciano Eva  Operation: Robotic-assisted laparoscopic total hysterectomy uterus >250gm with bilateral salpingoophorectomy, SLN biopsy  Operative Findings:  : 12cm uterus, normal tubes and ovaries. Large volume visceral adiposity limiting exposure and visualization and increasing the duration of the procedure.        07/08/2016 Imaging    CT scan of chest, abdomen and pelvis 1. Status post hysterectomy and bilateral salpingo-oophorectomy. No postoperative complicating features such as abnormal fluid collection or hematoma. 2. Small bilateral pelvic lymph nodes are indeterminate but recommend surveillance. 3. No findings for metastatic disease involving the chest or abdomen. 4. Moderate to large hiatal hernia. 5. Age advanced atherosclerotic calcifications involving the aorta and iliac arteries.      07/31/2016 Procedure    Successful placement of a right IJ approach Power Port with ultrasound and fluoroscopic guidance. The catheter is ready for use      08/04/2016 -  Chemotherapy    She received carboplatin and taxol      08/24/2016 Genetic Testing    MET c.769A>G VUS identified on the multi-gene cancer panel.  The Multi-Gene Panel offered by [facility] includes sequencing and/or deletion duplication testing of the following 80 genes: ALK, APC, ATM, AXIN2,BAP1,  BARD1, BLM, BMPR1A, BRCA1, BRCA2, BRIP1, CASR, CDC73, CDH1, CDK4, CDKN1B, CDKN1C, CDKN2A (p14ARF), CDKN2A (  p16INK4a), CEBPA, CHEK2, CTNNA1, DICER1, DIS3L2, EGFR (c.2369C>T, p.Thr790Met variant only), EPCAM  (Deletion/duplication testing only), FH, FLCN, GATA2, GPC3, GREM1 (Promoter region deletion/duplication testing only), HOXB13 (c.251G>A, p.Gly84Glu), HRAS, KIT, MAX, MEN1, MET, MITF (c.952G>A, p.Glu318Lys variant only), MLH1, MSH2, MSH3, MSH6, MUTYH, NBN, NF1, NF2, NTHL1, PALB2, PDGFRA, PHOX2B, PMS2, POLD1, POLE, POT1, PRKAR1A, PTCH1, PTEN, RAD50, RAD51C, RAD51D, RB1, RECQL4, RET, RUNX1, SDHAF2, SDHA (sequence changes only), SDHB, SDHC, SDHD, SMAD4, SMARCA4, SMARCB1, SMARCE1, STK11, SUFU, TERT, TERT, TMEM127, TP53, TSC1, TSC2, VHL, WRN and WT1.  The report date is August 24, 2016.       09/07/2016 -  Radiation Therapy    She started brachytherapy        INTERVAL HISTORY: Please see below for problem oriented charting. She returns for further follow-up She has completed radiation treatment without major complications Denies significant nausea, vomiting or changes in bowel habits She complain of intermittent mild neuropathy, stable No recent fever or chills  REVIEW OF SYSTEMS:   Constitutional: Denies fevers, chills or abnormal weight loss Eyes: Denies blurriness of vision Ears, nose, mouth, throat, and face: Denies mucositis or sore throat Respiratory: Denies cough, dyspnea or wheezes Cardiovascular: Denies palpitation, chest discomfort or lower extremity swelling Gastrointestinal:  Denies nausea, heartburn or change in bowel habits Skin: Denies abnormal skin rashes Lymphatics: Denies new lymphadenopathy or easy bruising Neurological:Denies numbness, tingling or new weaknesses Behavioral/Psych: Mood is stable, no new changes  All other systems were reviewed with the patient and are negative.  I have reviewed the past medical history, past surgical history, social history and family history with the patient and they are unchanged from previous note.  ALLERGIES:  has No Known Allergies.  MEDICATIONS:  Current Outpatient Prescriptions  Medication Sig Dispense Refill  . ALPRAZolam  (XANAX) 0.5 MG tablet Take 0.25-0.5 mg by mouth daily as needed for anxiety (FOR PANIC/ANXIETY).     . busPIRone (BUSPAR) 15 MG tablet Take 7.5 mg by mouth daily.     Marland Kitchen dexamethasone (DECADRON) 4 MG tablet Take 5 tablets the day before chemo and 5 tablets at 6 am the day of chemo with food, every 3 weeks 30 tablet 1  . escitalopram (LEXAPRO) 20 MG tablet Take 10 mg by mouth daily.     Marland Kitchen lidocaine-prilocaine (EMLA) cream Apply 1 application topically as needed. 30 g 3  . lidocaine-prilocaine (EMLA) cream Apply to affected area once 30 g 3  . ondansetron (ZOFRAN) 8 MG tablet Take 1 tablet (8 mg total) by mouth 2 (two) times daily as needed for refractory nausea / vomiting. Start on day 3 after chemo. 30 tablet 1  . prochlorperazine (COMPAZINE) 10 MG tablet Take 1 tablet (10 mg total) by mouth every 6 (six) hours as needed (Nausea or vomiting). (Patient not taking: Reported on 09/07/2016) 30 tablet 1   No current facility-administered medications for this visit.     PHYSICAL EXAMINATION: ECOG PERFORMANCE STATUS: 1 - Symptomatic but completely ambulatory  GENERAL:alert, no distress and comfortable.  She is morbidly obese SKIN: skin color, texture, turgor are normal, no rashes or significant lesions EYES: normal, Conjunctiva are pink and non-injected, sclera clear OROPHARYNX:no exudate, no erythema and lips, buccal mucosa, and tongue normal  NECK: supple, thyroid normal size, non-tender, without nodularity LYMPH:  no palpable lymphadenopathy in the cervical, axillary or inguinal LUNGS: clear to auscultation and percussion with normal breathing effort HEART: regular rate & rhythm and no murmurs and no lower extremity edema ABDOMEN:abdomen soft, non-tender and normal bowel sounds Musculoskeletal:no  cyanosis of digits and no clubbing  NEURO: alert & oriented x 3 with fluent speech, no focal motor/sensory deficits  LABORATORY DATA:  I have reviewed the data as listed    Component Value Date/Time    NA 139 10/08/2016 1100   K 4.1 10/08/2016 1100   CL 105 07/31/2016 1146   CO2 25 10/08/2016 1100   GLUCOSE 115 10/08/2016 1100   BUN 13.9 10/08/2016 1100   CREATININE 0.6 10/08/2016 1100   CALCIUM 9.3 10/08/2016 1100   PROT 7.4 10/08/2016 1100   ALBUMIN 3.6 10/08/2016 1100   AST 12 10/08/2016 1100   ALT 9 10/08/2016 1100   ALKPHOS 73 10/08/2016 1100   BILITOT 0.44 10/08/2016 1100   GFRNONAA >60 07/31/2016 1146   GFRAA >60 07/31/2016 1146    No results found for: SPEP, UPEP  Lab Results  Component Value Date   WBC 10.6 (H) 10/08/2016   NEUTROABS 9.8 (H) 10/08/2016   HGB 11.6 10/08/2016   HCT 37.0 10/08/2016   MCV 84.5 10/08/2016   PLT 335 10/08/2016      Chemistry      Component Value Date/Time   NA 139 10/08/2016 1100   K 4.1 10/08/2016 1100   CL 105 07/31/2016 1146   CO2 25 10/08/2016 1100   BUN 13.9 10/08/2016 1100   CREATININE 0.6 10/08/2016 1100      Component Value Date/Time   CALCIUM 9.3 10/08/2016 1100   ALKPHOS 73 10/08/2016 1100   AST 12 10/08/2016 1100   ALT 9 10/08/2016 1100   BILITOT 0.44 10/08/2016 1100      ASSESSMENT & PLAN:  Endometrial cancer ([place]) She has completed radiation treatment She tolerated treatment well except for very mild peripheral neuropathy We will proceed with treatment without dose adjustment  Peripheral neuropathy due to chemotherapy [facility]) she has mild peripheral neuropathy, likely related to side effects of treatment. It is only mild, not bothering the patient. I will observe for now If it gets worse in the future, I will consider modifying the dose of the treatment   Preventive measure We discussed the importance of preventive care and reviewed the vaccination programs. She does not have any prior allergic reactions to influenza vaccination. She agrees to proceed with influenza vaccination today and we will administer it today at the clinic.    No orders of the defined types were placed in this  encounter.  All questions were answered. The patient knows to call the clinic with any problems, questions or concerns. No barriers to learning was detected. I spent 15 minutes counseling the patient face to face. The total time spent in the appointment was 20 minutes and more than 50% was on counseling and review of test results     Heath Lark, MD 10/09/2016 11:14 AM

## 2016-10-09 NOTE — Assessment & Plan Note (Signed)
she has mild peripheral neuropathy, likely related to side effects of treatment. It is only mild, not bothering the patient. I will observe for now If it gets worse in the future, I will consider modifying the dose of the treatment  

## 2016-10-09 NOTE — Assessment & Plan Note (Signed)
We discussed the importance of preventive care and reviewed the vaccination programs. She does not have any prior allergic reactions to influenza vaccination. She agrees to proceed with influenza vaccination today and we will administer it today at the clinic.  

## 2016-10-10 ENCOUNTER — Ambulatory Visit (HOSPITAL_BASED_OUTPATIENT_CLINIC_OR_DEPARTMENT_OTHER): Payer: Managed Care, Other (non HMO)

## 2016-10-10 VITALS — BP 145/83 | HR 74 | Temp 98.4°F | Resp 18

## 2016-10-10 DIAGNOSIS — Z5189 Encounter for other specified aftercare: Secondary | ICD-10-CM

## 2016-10-10 DIAGNOSIS — C541 Malignant neoplasm of endometrium: Secondary | ICD-10-CM | POA: Diagnosis not present

## 2016-10-10 DIAGNOSIS — C775 Secondary and unspecified malignant neoplasm of intrapelvic lymph nodes: Secondary | ICD-10-CM

## 2016-10-10 MED ORDER — PEGFILGRASTIM INJECTION 6 MG/0.6ML ~~LOC~~
6.0000 mg | PREFILLED_SYRINGE | Freq: Once | SUBCUTANEOUS | Status: AC
  Administered 2016-10-10: 6 mg via SUBCUTANEOUS

## 2016-10-10 NOTE — Patient Instructions (Signed)
Pegfilgrastim injection What is this medicine? PEGFILGRASTIM (PEG fil gra stim) is a long-acting granulocyte colony-stimulating factor that stimulates the growth of neutrophils, a type of white blood cell important in the body's fight against infection. It is used to reduce the incidence of fever and infection in patients with certain types of cancer who are receiving chemotherapy that affects the bone marrow, and to increase survival after being exposed to high doses of radiation. This medicine may be used for other purposes; ask your health care provider or pharmacist if you have questions. COMMON BRAND NAME(S): Neulasta What should I tell my health care provider before I take this medicine? They need to know if you have any of these conditions: -kidney disease -latex allergy -ongoing radiation therapy -sickle cell disease -skin reactions to acrylic adhesives (On-Body Injector only) -an unusual or allergic reaction to pegfilgrastim, filgrastim, other medicines, foods, dyes, or preservatives -pregnant or trying to get pregnant -breast-feeding How should I use this medicine? This medicine is for injection under the skin. If you get this medicine at home, you will be taught how to prepare and give the pre-filled syringe or how to use the On-body Injector. Refer to the patient Instructions for Use for detailed instructions. Use exactly as directed. Tell your healthcare provider immediately if you suspect that the On-body Injector may not have performed as intended or if you suspect the use of the On-body Injector resulted in a missed or partial dose. It is important that you put your used needles and syringes in a special sharps container. Do not put them in a trash can. If you do not have a sharps container, call your pharmacist or healthcare provider to get one. Talk to your pediatrician regarding the use of this medicine in children. While this drug may be prescribed for selected conditions,  precautions do apply. Overdosage: If you think you have taken too much of this medicine contact a poison control center or emergency room at once. NOTE: This medicine is only for you. Do not share this medicine with others. What if I miss a dose? It is important not to miss your dose. Call your doctor or health care professional if you miss your dose. If you miss a dose due to an On-body Injector failure or leakage, a new dose should be administered as soon as possible using a single prefilled syringe for manual use. What may interact with this medicine? Interactions have not been studied. Give your health care provider a list of all the medicines, herbs, non-prescription drugs, or dietary supplements you use. Also tell them if you smoke, drink alcohol, or use illegal drugs. Some items may interact with your medicine. This list may not describe all possible interactions. Give your health care provider a list of all the medicines, herbs, non-prescription drugs, or dietary supplements you use. Also tell them if you smoke, drink alcohol, or use illegal drugs. Some items may interact with your medicine. What should I watch for while using this medicine? You may need blood work done while you are taking this medicine. If you are going to need a MRI, CT scan, or other procedure, tell your doctor that you are using this medicine (On-Body Injector only). What side effects may I notice from receiving this medicine? Side effects that you should report to your doctor or health care professional as soon as possible: -allergic reactions like skin rash, itching or hives, swelling of the face, lips, or tongue -dizziness -fever -pain, redness, or irritation at site   where injected -pinpoint red spots on the skin -red or dark-brown urine -shortness of breath or breathing problems -stomach or side pain, or pain at the shoulder -swelling -tiredness -trouble passing urine or change in the amount of urine Side  effects that usually do not require medical attention (report to your doctor or health care professional if they continue or are bothersome): -bone pain -muscle pain This list may not describe all possible side effects. Call your doctor for medical advice about side effects. You may report side effects to FDA at 1-800-FDA-1088. Where should I keep my medicine? Keep out of the reach of children. Store pre-filled syringes in a refrigerator between 2 and 8 degrees C (36 and 46 degrees F). Do not freeze. Keep in carton to protect from light. Throw away this medicine if it is left out of the refrigerator for more than 48 hours. Throw away any unused medicine after the expiration date. NOTE: This sheet is a summary. It may not cover all possible information. If you have questions about this medicine, talk to your doctor, pharmacist, or health care provider.  2018 Elsevier/Gold Standard (2015-12-26 12:58:03)  

## 2016-10-16 ENCOUNTER — Other Ambulatory Visit: Payer: Self-pay | Admitting: Hematology and Oncology

## 2016-10-16 DIAGNOSIS — C541 Malignant neoplasm of endometrium: Secondary | ICD-10-CM

## 2016-10-16 DIAGNOSIS — C775 Secondary and unspecified malignant neoplasm of intrapelvic lymph nodes: Secondary | ICD-10-CM

## 2016-10-28 ENCOUNTER — Other Ambulatory Visit (HOSPITAL_BASED_OUTPATIENT_CLINIC_OR_DEPARTMENT_OTHER): Payer: Managed Care, Other (non HMO)

## 2016-10-28 ENCOUNTER — Ambulatory Visit (HOSPITAL_BASED_OUTPATIENT_CLINIC_OR_DEPARTMENT_OTHER): Payer: Managed Care, Other (non HMO) | Admitting: Hematology and Oncology

## 2016-10-28 ENCOUNTER — Encounter: Payer: Self-pay | Admitting: Hematology and Oncology

## 2016-10-28 DIAGNOSIS — G620 Drug-induced polyneuropathy: Secondary | ICD-10-CM | POA: Diagnosis not present

## 2016-10-28 DIAGNOSIS — C775 Secondary and unspecified malignant neoplasm of intrapelvic lymph nodes: Secondary | ICD-10-CM | POA: Diagnosis not present

## 2016-10-28 DIAGNOSIS — C541 Malignant neoplasm of endometrium: Secondary | ICD-10-CM | POA: Diagnosis not present

## 2016-10-28 DIAGNOSIS — R030 Elevated blood-pressure reading, without diagnosis of hypertension: Secondary | ICD-10-CM

## 2016-10-28 DIAGNOSIS — R110 Nausea: Secondary | ICD-10-CM | POA: Diagnosis not present

## 2016-10-28 DIAGNOSIS — T451X5A Adverse effect of antineoplastic and immunosuppressive drugs, initial encounter: Secondary | ICD-10-CM

## 2016-10-28 LAB — CBC WITH DIFFERENTIAL/PLATELET
BASO%: 0.2 % (ref 0.0–2.0)
Basophils Absolute: 0 10*3/uL (ref 0.0–0.1)
EOS ABS: 0 10*3/uL (ref 0.0–0.5)
EOS%: 0.5 % (ref 0.0–7.0)
HCT: 36.1 % (ref 34.8–46.6)
HEMOGLOBIN: 11.3 g/dL — AB (ref 11.6–15.9)
LYMPH%: 18.4 % (ref 14.0–49.7)
MCH: 28.2 pg (ref 25.1–34.0)
MCHC: 31.3 g/dL — ABNORMAL LOW (ref 31.5–36.0)
MCV: 90 fL (ref 79.5–101.0)
MONO#: 0.9 10*3/uL (ref 0.1–0.9)
MONO%: 10.5 % (ref 0.0–14.0)
NEUT%: 70.4 % (ref 38.4–76.8)
NEUTROS ABS: 5.9 10*3/uL (ref 1.5–6.5)
PLATELETS: 193 10*3/uL (ref 145–400)
RBC: 4.01 10*6/uL (ref 3.70–5.45)
RDW: 26.3 % — AB (ref 11.2–14.5)
WBC: 8.4 10*3/uL (ref 3.9–10.3)
lymph#: 1.5 10*3/uL (ref 0.9–3.3)

## 2016-10-28 LAB — COMPREHENSIVE METABOLIC PANEL
ALT: 13 U/L (ref 0–55)
ANION GAP: 8 meq/L (ref 3–11)
AST: 14 U/L (ref 5–34)
Albumin: 3.6 g/dL (ref 3.5–5.0)
Alkaline Phosphatase: 73 U/L (ref 40–150)
BUN: 13.8 mg/dL (ref 7.0–26.0)
CALCIUM: 9.1 mg/dL (ref 8.4–10.4)
CHLORIDE: 105 meq/L (ref 98–109)
CO2: 28 meq/L (ref 22–29)
Creatinine: 0.7 mg/dL (ref 0.6–1.1)
Glucose: 90 mg/dl (ref 70–140)
POTASSIUM: 4.5 meq/L (ref 3.5–5.1)
Sodium: 140 mEq/L (ref 136–145)
Total Bilirubin: 0.42 mg/dL (ref 0.20–1.20)
Total Protein: 6.9 g/dL (ref 6.4–8.3)

## 2016-10-28 MED ORDER — PROCHLORPERAZINE MALEATE 10 MG PO TABS: ORAL_TABLET | ORAL | 1 refills | Status: DC

## 2016-10-28 NOTE — Assessment & Plan Note (Signed)
she has mild peripheral neuropathy, likely related to side effects of treatment. It is only mild, not bothering the patient. I will observe for now If it gets worse in the future, I will consider modifying the dose of the treatment  

## 2016-10-28 NOTE — Assessment & Plan Note (Signed)
She has mild nausea without vomiting We discussed anti-emetics use after treatment

## 2016-10-28 NOTE — Assessment & Plan Note (Signed)
She has completed radiation treatment She tolerated treatment well except for very mild peripheral neuropathy, mild nausea and mild bone achiness with G-CSF support. We will proceed with treatment without dose adjustment Per patient request, I will omit G-CSF support

## 2016-10-28 NOTE — Assessment & Plan Note (Signed)
Her blood pressure is elevated but she attributed that to anxiety We will continue to monitor her blood pressure closely

## 2016-10-28 NOTE — Progress Notes (Signed)
[place] OFFICE PROGRESS NOTE  Patient Care Team: Patient, No Pcp Per as PCP - General (General Practice)  SUMMARY OF ONCOLOGIC HISTORY: Oncology History   MSI instability not detected     Endometrial cancer ([place])   05/19/2016 Initial Diagnosis    The patient reports abnormal uterine bleeding for approximately 6 years (2-4 bleeding episodes per year). She had a particularly heavy bleeding episode on 05/21/16 and was seen in the ED. Hb was 7.2g/dL. She was started on Megace. Her bleeding improved.       05/29/2016 Imaging    [place] on 05/29/16 showed a uterus with 9x6x5.7cm dimensions and an endometrial stripe of 68m.       06/05/2016 Procedure    Endometrial pipelle biopsy on 06/05/16 showed a grade 2 endometrial cancer       06/25/2016 Pathology Results    1. Lymph node, sentinel, biopsy, right external iliac METASTATIC ADENOCARCINOMA IN ONE OF ONE LYMPH NODE (1/1) 2. Lymph node, sentinel, biopsy, right obturator METASTATIC ADENOCARCINOMA IN FOUR OF FOUR LYMPH NODES (4/4) 3. Uterus +/- tubes/ovaries, neoplastic, cervix ENDOMETRIOID ADENOCARCINOMA, FIGO GRADE 3, THE TUMOR INVADES OUTER HALF OF THE MYOMETRIUM (PT1B) ALL MARGINS OF RESECTION ARE NEGATIVE FOR TUMOR BENIGN BILATERAL OVARIES AND FALLOPIAN TUBES Microscopic Comment 3. ONCOLOGY TABLE-UTERUS, CARCINOMA OR CARCINOSARCOMA  Specimen: Uterus, bilateral fallopian tubes and ovaries Procedure: Hysterectomy bilateral salpingo-oophorectomy Lymph node sampling performed: Yes Specimen integrity: Intact Maximum tumor size: 6.2 cm Histologic type: Endometrioid adenocarcinoma Grade: 3 Myometrial invasion: 1.5 cm where myometrium is 2.6 cm in thickness Cervical stromal involvement: Negative Extent of involvement of other organs: Negative Lymph - vascular invasion: Identified Peritoneal washings: Not performed Lymph nodes: Examined: 5 Sentinel 0 Non-sentinel 5 Total Lymph nodes with metastasis: 5 Isolated tumor  cells (< 0.2 mm): 0 Micrometastasis: (> 0.2 mm and < 2.0 mm): 2 Macrometastasis: (> 2.0 mm): 3 Extracapsular extension: 1 Pelvic lymph nodes: 5 involved of 5 lymph nodes. Para-aortic lymph nodes: NA involved of NA lymph nodes. Other (specify involvement and site): NA TNM code: pT1b, pN1 FIGO Stage (based on pathologic findings, needs clinical correlation): IIIC1       06/25/2016 Surgery    Surgeon: RDonaciano Eva  Operation: Robotic-assisted laparoscopic total hysterectomy uterus >250gm with bilateral salpingoophorectomy, SLN biopsy  Operative Findings:  : 12cm uterus, normal tubes and ovaries. Large volume visceral adiposity limiting exposure and visualization and increasing the duration of the procedure.        07/08/2016 Imaging    CT scan of chest, abdomen and pelvis 1. Status post hysterectomy and bilateral salpingo-oophorectomy. No postoperative complicating features such as abnormal fluid collection or hematoma. 2. Small bilateral pelvic lymph nodes are indeterminate but recommend surveillance. 3. No findings for metastatic disease involving the chest or abdomen. 4. Moderate to large hiatal hernia. 5. Age advanced atherosclerotic calcifications involving the aorta and iliac arteries.      07/31/2016 Procedure    Successful placement of a right IJ approach Power Port with ultrasound and fluoroscopic guidance. The catheter is ready for use      08/04/2016 -  Chemotherapy    She received carboplatin and taxol      08/24/2016 Genetic Testing    MET c.769A>G VUS identified on the multi-gene cancer panel.  The Multi-Gene Panel offered by [facility] includes sequencing and/or deletion duplication testing of the following 80 genes: ALK, APC, ATM, AXIN2,BAP1,  BARD1, BLM, BMPR1A, BRCA1, BRCA2, BRIP1, CASR, CDC73, CDH1, CDK4, CDKN1B, CDKN1C, CDKN2A (p14ARF), CDKN2A (  p16INK4a), CEBPA, CHEK2, CTNNA1, DICER1, DIS3L2, EGFR (c.2369C>T, p.Thr790Met variant only), EPCAM  (Deletion/duplication testing only), FH, FLCN, GATA2, GPC3, GREM1 (Promoter region deletion/duplication testing only), HOXB13 (c.251G>A, p.Gly84Glu), HRAS, KIT, MAX, MEN1, MET, MITF (c.952G>A, p.Glu318Lys variant only), MLH1, MSH2, MSH3, MSH6, MUTYH, NBN, NF1, NF2, NTHL1, PALB2, PDGFRA, PHOX2B, PMS2, POLD1, POLE, POT1, PRKAR1A, PTCH1, PTEN, RAD50, RAD51C, RAD51D, RB1, RECQL4, RET, RUNX1, SDHAF2, SDHA (sequence changes only), SDHB, SDHC, SDHD, SMAD4, SMARCA4, SMARCB1, SMARCE1, STK11, SUFU, TERT, TERT, TMEM127, TP53, TSC1, TSC2, VHL, WRN and WT1.  The report date is August 24, 2016.       09/07/2016 -  Radiation Therapy    She started brachytherapy        INTERVAL HISTORY: Please see below for problem oriented charting. 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  I have reviewed the past medical history, past surgical history, social history and family history with the patient and they are unchanged from previous note.  ALLERGIES:  has No Known Allergies.  MEDICATIONS:  Current Outpatient Prescriptions  Medication Sig Dispense Refill  . ALPRAZolam (XANAX) 0.5 MG tablet Take 0.25-0.5 mg by  mouth daily as needed for anxiety (FOR PANIC/ANXIETY).     . busPIRone (BUSPAR) 15 MG tablet Take 7.5 mg by mouth daily.     Marland Kitchen dexamethasone (DECADRON) 4 MG tablet Take 5 tablets the day before chemo and 5 tablets at 6 am the day of chemo with food, every 3 weeks 30 tablet 1  . escitalopram (LEXAPRO) 20 MG tablet Take 10 mg by mouth daily.     Marland Kitchen lidocaine-prilocaine (EMLA) cream Apply 1 application topically as needed. 30 g 3  . lidocaine-prilocaine (EMLA) cream Apply to affected area once 30 g 3  . ondansetron (ZOFRAN) 8 MG tablet Take 1 tablet (8 mg total) by mouth 2 (two) times daily as needed for refractory nausea / vomiting. Start on day 3 after chemo. 30 tablet 1  . prochlorperazine (COMPAZINE) 10 MG tablet TAKE 1 TABLET BY MOUTH EVERY 6 HOURS AS NEEDED FOR NAUSEA AND VOMITING 60 tablet 1   No current facility-administered medications for this visit.     PHYSICAL EXAMINATION: ECOG PERFORMANCE STATUS: 1 - Symptomatic but completely ambulatory  Vitals:   10/28/16 0933  BP: (!) 171/104  Pulse: 86  Resp: 18  Temp: 98.1 F (36.7 C)  SpO2: 97%   Filed Weights   10/28/16 0933  Weight: (!) 301 lb 9.6 oz (136.8 kg)    GENERAL:alert, no distress and comfortable. She is morbidly obese SKIN: skin color, texture, turgor are normal, no rashes or significant lesions EYES: normal, Conjunctiva are pink and non-injected, sclera clear OROPHARYNX:no exudate, no erythema and lips, buccal mucosa, and tongue normal  NECK: supple, thyroid normal size, non-tender, without nodularity LYMPH:  no palpable lymphadenopathy in the cervical,  axillary or inguinal LUNGS: clear to auscultation and percussion with normal breathing effort HEART: regular rate & rhythm and no murmurs and no lower extremity edema ABDOMEN:abdomen soft, non-tender and normal bowel sounds Musculoskeletal:no cyanosis of digits and no clubbing  NEURO: alert & oriented x 3 with fluent speech, no focal motor/sensory  deficits  LABORATORY DATA:  I have reviewed the data as listed    Component Value Date/Time   NA 139 10/08/2016 1100   K 4.1 10/08/2016 1100   CL 105 07/31/2016 1146   CO2 25 10/08/2016 1100   GLUCOSE 115 10/08/2016 1100   BUN 13.9 10/08/2016 1100   CREATININE 0.6 10/08/2016 1100   CALCIUM 9.3 10/08/2016 1100   PROT 7.4 10/08/2016 1100   ALBUMIN 3.6 10/08/2016 1100   AST 12 10/08/2016 1100   ALT 9 10/08/2016 1100   ALKPHOS 73 10/08/2016 1100   BILITOT 0.44 10/08/2016 1100   GFRNONAA >60 07/31/2016 1146   GFRAA >60 07/31/2016 1146    No results found for: SPEP, UPEP  Lab Results  Component Value Date   WBC 8.4 10/28/2016   NEUTROABS 5.9 10/28/2016   HGB 11.3 (L) 10/28/2016   HCT 36.1 10/28/2016   MCV 90.0 10/28/2016   PLT 193 10/28/2016      Chemistry      Component Value Date/Time   NA 139 10/08/2016 1100   K 4.1 10/08/2016 1100   CL 105 07/31/2016 1146   CO2 25 10/08/2016 1100   BUN 13.9 10/08/2016 1100   CREATININE 0.6 10/08/2016 1100      Component Value Date/Time   CALCIUM 9.3 10/08/2016 1100   ALKPHOS 73 10/08/2016 1100   AST 12 10/08/2016 1100   ALT 9 10/08/2016 1100   BILITOT 0.44 10/08/2016 1100      ASSESSMENT & PLAN:  Endometrial cancer ([place]) She has completed radiation treatment She tolerated treatment well except for very mild peripheral neuropathy, mild nausea and mild bone achiness with G-CSF support. We will proceed with treatment without dose adjustment Per patient request, I will omit G-CSF support  Peripheral neuropathy due to chemotherapy [facility]) she has mild peripheral neuropathy, likely related to side effects of treatment. It is only mild, not bothering the patient. I will observe for now If it gets worse in the future, I will consider modifying the dose of the treatment   White coat syndrome without diagnosis of hypertension Her blood pressure is elevated but she attributed that to anxiety We will continue to monitor her  blood pressure closely  Nausea without vomiting She has mild nausea without vomiting We discussed anti-emetics use after treatment   No orders of the defined types were placed in this encounter.  All questions were answered. The patient knows to call the clinic with any problems, questions or concerns. No barriers to learning was detected. I spent 15 minutes counseling the patient face to face. The total time spent in the appointment was 20 minutes and more than 50% was on counseling and review of test results     Heath Lark, MD 10/28/2016 9:45 AM

## 2016-10-29 ENCOUNTER — Ambulatory Visit (HOSPITAL_BASED_OUTPATIENT_CLINIC_OR_DEPARTMENT_OTHER): Payer: Managed Care, Other (non HMO)

## 2016-10-29 VITALS — BP 191/118 | HR 75 | Temp 98.2°F

## 2016-10-29 DIAGNOSIS — Z5111 Encounter for antineoplastic chemotherapy: Secondary | ICD-10-CM

## 2016-10-29 DIAGNOSIS — C775 Secondary and unspecified malignant neoplasm of intrapelvic lymph nodes: Secondary | ICD-10-CM | POA: Diagnosis not present

## 2016-10-29 DIAGNOSIS — C541 Malignant neoplasm of endometrium: Secondary | ICD-10-CM | POA: Diagnosis not present

## 2016-10-29 MED ORDER — FOSAPREPITANT DIMEGLUMINE INJECTION 150 MG
Freq: Once | INTRAVENOUS | Status: AC
  Administered 2016-10-29: 09:00:00 via INTRAVENOUS
  Filled 2016-10-29: qty 5

## 2016-10-29 MED ORDER — SODIUM CHLORIDE 0.9 % IV SOLN
Freq: Once | INTRAVENOUS | Status: AC
  Administered 2016-10-29: 08:00:00 via INTRAVENOUS

## 2016-10-29 MED ORDER — FAMOTIDINE IN NACL 20-0.9 MG/50ML-% IV SOLN
20.0000 mg | Freq: Once | INTRAVENOUS | Status: AC
  Administered 2016-10-29: 20 mg via INTRAVENOUS

## 2016-10-29 MED ORDER — HEPARIN SOD (PORK) LOCK FLUSH 100 UNIT/ML IV SOLN
500.0000 [IU] | Freq: Once | INTRAVENOUS | Status: AC | PRN
  Administered 2016-10-29: 500 [IU]
  Filled 2016-10-29: qty 5

## 2016-10-29 MED ORDER — PALONOSETRON HCL INJECTION 0.25 MG/5ML
INTRAVENOUS | Status: AC
  Filled 2016-10-29: qty 5

## 2016-10-29 MED ORDER — DIPHENHYDRAMINE HCL 50 MG/ML IJ SOLN
INTRAMUSCULAR | Status: AC
  Filled 2016-10-29: qty 1

## 2016-10-29 MED ORDER — LORAZEPAM 1 MG PO TABS
0.5000 mg | ORAL_TABLET | Freq: Once | ORAL | Status: AC
  Administered 2016-10-29: 0.5 mg via ORAL

## 2016-10-29 MED ORDER — SODIUM CHLORIDE 0.9 % IV SOLN
175.0000 mg/m2 | Freq: Once | INTRAVENOUS | Status: AC
  Administered 2016-10-29: 432 mg via INTRAVENOUS
  Filled 2016-10-29: qty 72

## 2016-10-29 MED ORDER — FAMOTIDINE IN NACL 20-0.9 MG/50ML-% IV SOLN
INTRAVENOUS | Status: AC
  Filled 2016-10-29: qty 50

## 2016-10-29 MED ORDER — DIPHENHYDRAMINE HCL 50 MG/ML IJ SOLN
50.0000 mg | Freq: Once | INTRAMUSCULAR | Status: AC
Start: 2016-10-29 — End: 2016-10-29
  Administered 2016-10-29: 50 mg via INTRAVENOUS

## 2016-10-29 MED ORDER — PALONOSETRON HCL INJECTION 0.25 MG/5ML
0.2500 mg | Freq: Once | INTRAVENOUS | Status: AC
  Administered 2016-10-29: 0.25 mg via INTRAVENOUS

## 2016-10-29 MED ORDER — PACLITAXEL CHEMO INJECTION 300 MG/50ML
175.0000 mg/m2 | Freq: Once | INTRAVENOUS | Status: DC
  Filled 2016-10-29: qty 72

## 2016-10-29 MED ORDER — LORAZEPAM 1 MG PO TABS
ORAL_TABLET | ORAL | Status: AC
  Filled 2016-10-29: qty 1

## 2016-10-29 MED ORDER — SODIUM CHLORIDE 0.9% FLUSH
10.0000 mL | INTRAVENOUS | Status: DC | PRN
  Administered 2016-10-29: 10 mL
  Filled 2016-10-29: qty 10

## 2016-10-29 MED ORDER — SODIUM CHLORIDE 0.9 % IV SOLN
750.0000 mg | Freq: Once | INTRAVENOUS | Status: AC
  Administered 2016-10-29: 750 mg via INTRAVENOUS
  Filled 2016-10-29: qty 75

## 2016-10-29 NOTE — Patient Instructions (Signed)
[facility] Discharge Instructions for Patients Receiving Chemotherapy  Today you received the following chemotherapy agents Taxol/Carboplatin To help prevent nausea and vomiting after your treatment, we encourage you to take your nausea medication as prescribed.   If you develop nausea and vomiting that is not controlled by your nausea medication, call the clinic.   BELOW ARE SYMPTOMS THAT SHOULD BE REPORTED IMMEDIATELY:  *FEVER GREATER THAN 100.5 F  *CHILLS WITH OR WITHOUT FEVER  NAUSEA AND VOMITING THAT IS NOT CONTROLLED WITH YOUR NAUSEA MEDICATION  *UNUSUAL SHORTNESS OF BREATH  *UNUSUAL BRUISING OR BLEEDING  TENDERNESS IN MOUTH AND THROAT WITH OR WITHOUT PRESENCE OF ULCERS  *URINARY PROBLEMS  *BOWEL PROBLEMS  UNUSUAL RASH Items with * indicate a potential emergency and should be followed up as soon as possible.  Feel free to call the clinic you have any questions or concerns. The clinic phone number is [phone].  Please show the CHEMO ALERT CARD at check-in to the Emergency Department and triage nurse.   

## 2016-10-29 NOTE — Progress Notes (Signed)
Per Dr Alvy Bimler ok to tx today with high BP pt to take Ativan 0.5 mg in infusion room.

## 2016-10-30 ENCOUNTER — Encounter: Payer: Self-pay | Admitting: Oncology

## 2016-10-31 ENCOUNTER — Ambulatory Visit: Payer: Managed Care, Other (non HMO)

## 2016-11-02 ENCOUNTER — Telehealth: Payer: Self-pay | Admitting: *Deleted

## 2016-11-02 ENCOUNTER — Ambulatory Visit
Admission: RE | Admit: 2016-11-02 | Discharge: 2016-11-02 | Disposition: A | Discharge status: Home / Self Care | Payer: Managed Care, Other (non HMO) | Source: Ambulatory Visit | Attending: Radiation Oncology | Admitting: Radiation Oncology

## 2016-11-02 ENCOUNTER — Telehealth: Payer: Self-pay | Admitting: Oncology

## 2016-11-02 ENCOUNTER — Encounter: Payer: Self-pay | Admitting: Radiation Oncology

## 2016-11-02 DIAGNOSIS — Z90710 Acquired absence of both cervix and uterus: Secondary | ICD-10-CM | POA: Insufficient documentation

## 2016-11-02 DIAGNOSIS — Z803 Family history of malignant neoplasm of breast: Secondary | ICD-10-CM | POA: Diagnosis not present

## 2016-11-02 DIAGNOSIS — Z90722 Acquired absence of ovaries, bilateral: Secondary | ICD-10-CM | POA: Diagnosis not present

## 2016-11-02 DIAGNOSIS — Z79899 Other long term (current) drug therapy: Secondary | ICD-10-CM | POA: Insufficient documentation

## 2016-11-02 DIAGNOSIS — I10 Essential (primary) hypertension: Secondary | ICD-10-CM | POA: Diagnosis not present

## 2016-11-02 DIAGNOSIS — C541 Malignant neoplasm of endometrium: Secondary | ICD-10-CM | POA: Insufficient documentation

## 2016-11-02 DIAGNOSIS — F419 Anxiety disorder, unspecified: Secondary | ICD-10-CM | POA: Diagnosis not present

## 2016-11-02 DIAGNOSIS — Z808 Family history of malignant neoplasm of other organs or systems: Secondary | ICD-10-CM | POA: Diagnosis not present

## 2016-11-02 NOTE — Progress Notes (Signed)

## 2016-11-02 NOTE — Progress Notes (Signed)
Radiation Oncology         [phone] ________________________________  Name: Misty [name] MRN: [id number]  Date: 11/02/2016  DOB: 1960/10/08  Follow-Up Visit Note  CC: Patient, No Pcp Per  Everitt Amber, MD    ICD-10-CM   1. 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  Narrative:  The patient returns today for routine follow-up.  She denies having any pain, urinary/bowel issues or vaginal bleeding/discharge. She had chemotherapy last Thursday. She reports having fatigue. She has been given a size S and S+ vaginal dilator. She is scheduled to follow up with Dr. Alvy Bimler on 11/18/16.                               ALLERGIES:  has No Known Allergies.  Meds: Current Outpatient Prescriptions  Medication Sig Dispense Refill  . ALPRAZolam (XANAX) 0.5 MG tablet Take 0.25-0.5 mg by mouth daily as needed for anxiety (FOR PANIC/ANXIETY).     . busPIRone (BUSPAR) 15 MG tablet Take 7.5 mg by mouth daily.     Marland Kitchen dexamethasone (DECADRON) 4 MG tablet Take 5 tablets the day before chemo and 5 tablets at 6 am the day of chemo with food, every 3 weeks 30 tablet 1  . escitalopram (LEXAPRO) 20 MG tablet Take 10 mg by mouth daily.     Marland Kitchen lidocaine-prilocaine (EMLA) cream Apply 1 application topically as needed. 30 g 3  . lidocaine-prilocaine (EMLA) cream Apply to affected area once 30 g 3  . prochlorperazine (COMPAZINE) 10 MG tablet TAKE 1 TABLET BY MOUTH EVERY 6 HOURS AS NEEDED FOR NAUSEA AND VOMITING 60 tablet 1  . ondansetron (ZOFRAN) 8 MG tablet Take 1 tablet (8 mg total) by mouth 2 (two) times daily as needed for refractory nausea / vomiting. Start on day 3 after chemo. (Patient not taking: Reported on 11/02/2016) 30 tablet 1   No current facility-administered medications for this encounter.     Physical Findings: The patient  is in no acute distress. Patient is alert and oriented.  height is 5\' 4"  (1.626 m) and weight is 295 lb 12.8 oz (134.2 kg). Her oral temperature is 97.9 F (36.6 C). Her blood pressure is 161/117 (abnormal) and her pulse is 99. Her oxygen saturation is 100%. .  Lungs are clear to auscultation bilaterally. Heart has regular rate and rhythm. No palpable cervical, supraclavicular, or axillary adenopathy. Abdomen soft, non-tender, normal bowel sounds. Pelvic exam deferred in light of recent treatment completion.  Lab Findings: Lab Results  Component Value Date   WBC 8.4 10/28/2016   HGB 11.3 (L) 10/28/2016   HCT 36.1 10/28/2016   MCV 90.0 10/28/2016   PLT 193 10/28/2016    Radiographic Findings: No results found.  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    She has one more cycle of chemotherapy to complete her adjuvant treatment.  Plan:  She is given vaginal dilator today and instructed on its use. Follow up in our clinic in 5 months. Follow up with Dr. Denman George in 2 months.   -----------------------------------  Blair Promise, PhD, MD  This document serves as a record of services personally performed by Gery Pray, MD. It was created on his behalf by Arlyce Harman, a trained medical scribe. The creation  of this record is based on the scribe's personal observations and the provider's statements to them. This document has been checked and approved by the attending provider.

## 2016-11-02 NOTE — Telephone Encounter (Signed)
Patient left a message asking when her appointment is today and what to expect.  Called her back and left her a message with the appointment time and advised her to call back with any questions.

## 2016-11-02 NOTE — Telephone Encounter (Signed)
Scheduled the patient for a follow up appt per radiation. Appt scheduled for December 17th at 3:45pm. Called and left Enid Derry a message in radiation.

## 2016-11-02 NOTE — Progress Notes (Signed)
Misty [name] is here for follow up.  She denies having any pain, urinary/bowel issues or vaginal bleeding/discharge.  She had chemotherapy last Thursday.  She reports having fatigue.  She has been given a size S and S+ vaginal dilator.  BP (!) 161/117 (BP Location: Left Arm, Patient Position: Sitting)   Pulse 99   Temp 97.9 F (36.6 C) (Oral)   Ht 5\' 4"  (1.626 m)   Wt 295 lb 12.8 oz (134.2 kg)   LMP 05/31/2016 (Approximate)   SpO2 100%   BMI 50.77 kg/m    Wt Readings from Last 3 Encounters:  11/02/16 295 lb 12.8 oz (134.2 kg)  10/28/16 (!) 301 lb 9.6 oz (136.8 kg)  09/17/16 (!) 300 lb 4.8 oz (136.2 kg)

## 2016-11-03 ENCOUNTER — Telehealth: Payer: Self-pay | Admitting: *Deleted

## 2016-11-03 NOTE — Telephone Encounter (Signed)
CALLED PATIENT TO INFORM OF APPT. WITH DR. Denman George ON 12-28-16 @ 3:45 PM, LVM FOR A RETURN CALL

## 2016-11-13 ENCOUNTER — Telehealth: Payer: Self-pay

## 2016-11-13 MED ORDER — GABAPENTIN 300 MG PO CAPS: 300.0000 mg | ORAL_CAPSULE | Freq: Three times a day (TID) | ORAL | 3 refills | Status: DC

## 2016-11-13 NOTE — Telephone Encounter (Signed)
Pt has 1 more chemo tx to go. She is having neuropathy in fingers and feet. Pins and needles.  It has either gotten worse or it is wearing her out. She cannot sleep d/t pain. She is waking up every hour or so. Has it ATC. Claritin is not helping much. Is taking extra strength tylenol every 6-7 hours. Is scared it may be permanent. Cut dosage or skip last treatment. Not disruption in picking up things but is dragging feet some. Also cannot keep warm in extremities.  Pt asked possibly gabapentin?

## 2016-11-13 NOTE — Addendum Note (Signed)
Addended by: Janace Hoard on: 11/13/2016 04:15 PM   Modules accepted: Orders

## 2016-11-13 NOTE — Telephone Encounter (Signed)
S/w pt per Dr Alvy Bimler note. Gabapentin e-scribed.

## 2016-11-13 NOTE — Telephone Encounter (Signed)
Pls call in gabapentin 300 mg TID PO, disp 90, 3 refills Warn her risk of sedation, nausea and constipation I will discuss with her related to last dose of chemo

## 2016-11-18 ENCOUNTER — Ambulatory Visit (HOSPITAL_BASED_OUTPATIENT_CLINIC_OR_DEPARTMENT_OTHER): Payer: Managed Care, Other (non HMO) | Admitting: Hematology and Oncology

## 2016-11-18 ENCOUNTER — Encounter: Payer: Self-pay | Admitting: Hematology and Oncology

## 2016-11-18 ENCOUNTER — Other Ambulatory Visit (HOSPITAL_BASED_OUTPATIENT_CLINIC_OR_DEPARTMENT_OTHER): Payer: Managed Care, Other (non HMO)

## 2016-11-18 DIAGNOSIS — R110 Nausea: Secondary | ICD-10-CM | POA: Diagnosis not present

## 2016-11-18 DIAGNOSIS — C541 Malignant neoplasm of endometrium: Secondary | ICD-10-CM

## 2016-11-18 DIAGNOSIS — R030 Elevated blood-pressure reading, without diagnosis of hypertension: Secondary | ICD-10-CM

## 2016-11-18 DIAGNOSIS — G620 Drug-induced polyneuropathy: Secondary | ICD-10-CM | POA: Diagnosis not present

## 2016-11-18 DIAGNOSIS — C775 Secondary and unspecified malignant neoplasm of intrapelvic lymph nodes: Secondary | ICD-10-CM

## 2016-11-18 DIAGNOSIS — T451X5A Adverse effect of antineoplastic and immunosuppressive drugs, initial encounter: Secondary | ICD-10-CM

## 2016-11-18 LAB — COMPREHENSIVE METABOLIC PANEL
ALBUMIN: 3.5 g/dL (ref 3.5–5.0)
ALK PHOS: 59 U/L (ref 40–150)
ALT: 12 U/L (ref 0–55)
ANION GAP: 7 meq/L (ref 3–11)
AST: 12 U/L (ref 5–34)
BUN: 18.6 mg/dL (ref 7.0–26.0)
CO2: 27 mEq/L (ref 22–29)
Calcium: 9.2 mg/dL (ref 8.4–10.4)
Chloride: 105 mEq/L (ref 98–109)
Creatinine: 0.7 mg/dL (ref 0.6–1.1)
GLUCOSE: 82 mg/dL (ref 70–140)
POTASSIUM: 4.7 meq/L (ref 3.5–5.1)
SODIUM: 140 meq/L (ref 136–145)
Total Bilirubin: 0.33 mg/dL (ref 0.20–1.20)
Total Protein: 7 g/dL (ref 6.4–8.3)

## 2016-11-18 LAB — CBC WITH DIFFERENTIAL/PLATELET
BASO%: 0.2 % (ref 0.0–2.0)
Basophils Absolute: 0 10*3/uL (ref 0.0–0.1)
EOS%: 0.5 % (ref 0.0–7.0)
Eosinophils Absolute: 0 10*3/uL (ref 0.0–0.5)
HEMATOCRIT: 36.2 % (ref 34.8–46.6)
HEMOGLOBIN: 11.5 g/dL — AB (ref 11.6–15.9)
LYMPH#: 1.4 10*3/uL (ref 0.9–3.3)
LYMPH%: 22.1 % (ref 14.0–49.7)
MCH: 28.4 pg (ref 25.1–34.0)
MCHC: 31.8 g/dL (ref 31.5–36.0)
MCV: 89.4 fL (ref 79.5–101.0)
MONO#: 1 10*3/uL — AB (ref 0.1–0.9)
MONO%: 16.9 % — ABNORMAL HIGH (ref 0.0–14.0)
NEUT%: 60.3 % (ref 38.4–76.8)
NEUTROS ABS: 3.7 10*3/uL (ref 1.5–6.5)
NRBC: 0 % (ref 0–0)
Platelets: 156 10*3/uL (ref 145–400)
RBC: 4.05 10*6/uL (ref 3.70–5.45)
RDW: 21.7 % — AB (ref 11.2–14.5)
WBC: 6.2 10*3/uL (ref 3.9–10.3)

## 2016-11-18 MED ORDER — GABAPENTIN 300 MG PO CAPS: 600.0000 mg | ORAL_CAPSULE | Freq: Two times a day (BID) | ORAL | 3 refills | Status: DC

## 2016-11-18 NOTE — Progress Notes (Signed)
[place] OFFICE PROGRESS NOTE  Patient Care Team: Patient, No Pcp Per as PCP - General (General Practice)  SUMMARY OF ONCOLOGIC HISTORY: Oncology History   MSI instability not detected     Endometrial cancer ([place])   05/19/2016 Initial Diagnosis    The patient reports abnormal uterine bleeding for approximately 6 years (2-4 bleeding episodes per year). She had a particularly heavy bleeding episode on 05/21/16 and was seen in the ED. Hb was 7.2g/dL. She was started on Megace. Her bleeding improved.       05/29/2016 Imaging    [place] on 05/29/16 showed a uterus with 9x6x5.7cm dimensions and an endometrial stripe of 68m.       06/05/2016 Procedure    Endometrial pipelle biopsy on 06/05/16 showed a grade 2 endometrial cancer       06/25/2016 Pathology Results    1. Lymph node, sentinel, biopsy, right external iliac METASTATIC ADENOCARCINOMA IN ONE OF ONE LYMPH NODE (1/1) 2. Lymph node, sentinel, biopsy, right obturator METASTATIC ADENOCARCINOMA IN FOUR OF FOUR LYMPH NODES (4/4) 3. Uterus +/- tubes/ovaries, neoplastic, cervix ENDOMETRIOID ADENOCARCINOMA, FIGO GRADE 3, THE TUMOR INVADES OUTER HALF OF THE MYOMETRIUM (PT1B) ALL MARGINS OF RESECTION ARE NEGATIVE FOR TUMOR BENIGN BILATERAL OVARIES AND FALLOPIAN TUBES Microscopic Comment 3. ONCOLOGY TABLE-UTERUS, CARCINOMA OR CARCINOSARCOMA  Specimen: Uterus, bilateral fallopian tubes and ovaries Procedure: Hysterectomy bilateral salpingo-oophorectomy Lymph node sampling performed: Yes Specimen integrity: Intact Maximum tumor size: 6.2 cm Histologic type: Endometrioid adenocarcinoma Grade: 3 Myometrial invasion: 1.5 cm where myometrium is 2.6 cm in thickness Cervical stromal involvement: Negative Extent of involvement of other organs: Negative Lymph - vascular invasion: Identified Peritoneal washings: Not performed Lymph nodes: Examined: 5 Sentinel 0 Non-sentinel 5 Total Lymph nodes with metastasis: 5 Isolated tumor  cells (< 0.2 mm): 0 Micrometastasis: (> 0.2 mm and < 2.0 mm): 2 Macrometastasis: (> 2.0 mm): 3 Extracapsular extension: 1 Pelvic lymph nodes: 5 involved of 5 lymph nodes. Para-aortic lymph nodes: NA involved of NA lymph nodes. Other (specify involvement and site): NA TNM code: pT1b, pN1 FIGO Stage (based on pathologic findings, needs clinical correlation): IIIC1       06/25/2016 Surgery    Surgeon: RDonaciano Eva  Operation: Robotic-assisted laparoscopic total hysterectomy uterus >250gm with bilateral salpingoophorectomy, SLN biopsy  Operative Findings:  : 12cm uterus, normal tubes and ovaries. Large volume visceral adiposity limiting exposure and visualization and increasing the duration of the procedure.        07/08/2016 Imaging    CT scan of chest, abdomen and pelvis 1. Status post hysterectomy and bilateral salpingo-oophorectomy. No postoperative complicating features such as abnormal fluid collection or hematoma. 2. Small bilateral pelvic lymph nodes are indeterminate but recommend surveillance. 3. No findings for metastatic disease involving the chest or abdomen. 4. Moderate to large hiatal hernia. 5. Age advanced atherosclerotic calcifications involving the aorta and iliac arteries.      07/31/2016 Procedure    Successful placement of a right IJ approach Power Port with ultrasound and fluoroscopic guidance. The catheter is ready for use      08/04/2016 -  Chemotherapy    She received carboplatin and taxol      08/24/2016 Genetic Testing    MET c.769A>G VUS identified on the multi-gene cancer panel.  The Multi-Gene Panel offered by [facility] includes sequencing and/or deletion duplication testing of the following 80 genes: ALK, APC, ATM, AXIN2,BAP1,  BARD1, BLM, BMPR1A, BRCA1, BRCA2, BRIP1, CASR, CDC73, CDH1, CDK4, CDKN1B, CDKN1C, CDKN2A (p14ARF), CDKN2A (  p16INK4a), CEBPA, CHEK2, CTNNA1, DICER1, DIS3L2, EGFR (c.2369C>T, p.Thr790Met variant only), EPCAM  (Deletion/duplication testing only), FH, FLCN, GATA2, GPC3, GREM1 (Promoter region deletion/duplication testing only), HOXB13 (c.251G>A, p.Gly84Glu), HRAS, KIT, MAX, MEN1, MET, MITF (c.952G>A, p.Glu318Lys variant only), MLH1, MSH2, MSH3, MSH6, MUTYH, NBN, NF1, NF2, NTHL1, PALB2, PDGFRA, PHOX2B, PMS2, POLD1, POLE, POT1, PRKAR1A, PTCH1, PTEN, RAD50, RAD51C, RAD51D, RB1, RECQL4, RET, RUNX1, SDHAF2, SDHA (sequence changes only), SDHB, SDHC, SDHD, SMAD4, SMARCA4, SMARCB1, SMARCE1, STK11, SUFU, TERT, TERT, TMEM127, TP53, TSC1, TSC2, VHL, WRN and WT1.  The report date is August 24, 2016.       09/07/2016 -  Radiation Therapy    She started brachytherapy        INTERVAL HISTORY: Please see below for problem oriented charting. She is seen today prior to cycle 6 of treatment With her last cycle of chemotherapy, she has developed severe peripheral neuropathy On the tips of her fingers, she complained of tingling sensation but she had recent lost function with inability to pick up small objects or button her shirt With the bottom of her feet, she complained of tingling, burning and occasional painful sensation Since she started taking gabapentin, her symptoms are mildly improved She denies significant sedation or constipation with gabapentin The patient is very nervous and fearful of completing her chemotherapy as scheduled tomorrow She have some mild nausea but no vomiting No changes in bowel habits  REVIEW OF SYSTEMS:   Constitutional: Denies fevers, chills or abnormal weight loss Eyes: Denies blurriness of vision Ears, nose, mouth, throat, and face: Denies mucositis or sore throat Respiratory: Denies cough, dyspnea or wheezes Cardiovascular: Denies palpitation, chest discomfort or lower extremity swelling Skin: Denies abnormal skin rashes Lymphatics: Denies new lymphadenopathy or easy bruising Behavioral/Psych: Mood is stable, no new changes  All other systems were reviewed with the patient and  are negative.  I have reviewed the past medical history, past surgical history, social history and family history with the patient and they are unchanged from previous note.  ALLERGIES:  has No Known Allergies.  MEDICATIONS:  Current Outpatient Medications  Medication Sig Dispense Refill  . ALPRAZolam (XANAX) 0.5 MG tablet Take 0.25-0.5 mg by mouth daily as needed for anxiety (FOR PANIC/ANXIETY).     . busPIRone (BUSPAR) 15 MG tablet Take 7.5 mg by mouth daily.     Marland Kitchen dexamethasone (DECADRON) 4 MG tablet Take 5 tablets the day before chemo and 5 tablets at 6 am the day of chemo with food, every 3 weeks 30 tablet 1  . escitalopram (LEXAPRO) 20 MG tablet Take 10 mg by mouth daily.     Marland Kitchen gabapentin (NEURONTIN) 300 MG capsule Take 2 capsules (600 mg total) 2 (two) times daily by mouth. 90 capsule 3  . lidocaine-prilocaine (EMLA) cream Apply 1 application topically as needed. 30 g 3  . lidocaine-prilocaine (EMLA) cream Apply to affected area once 30 g 3  . ondansetron (ZOFRAN) 8 MG tablet Take 1 tablet (8 mg total) by mouth 2 (two) times daily as needed for refractory nausea / vomiting. Start on day 3 after chemo. (Patient not taking: Reported on 11/02/2016) 30 tablet 1  . prochlorperazine (COMPAZINE) 10 MG tablet TAKE 1 TABLET BY MOUTH EVERY 6 HOURS AS NEEDED FOR NAUSEA AND VOMITING 60 tablet 1   No current facility-administered medications for this visit.     PHYSICAL EXAMINATION: ECOG PERFORMANCE STATUS: 1 - Symptomatic but completely ambulatory  Vitals:   11/18/16 0938  BP: (!) 159/106  Pulse: 81  Resp:  17  Temp: 98.1 F (36.7 C)  SpO2: 99%   Filed Weights   11/18/16 0938  Weight: 295 lb 12.8 oz (134.2 kg)    GENERAL:alert, no distress and comfortable.  She is morbidly obese SKIN: skin color, texture, turgor are normal, no rashes or significant lesions EYES: normal, Conjunctiva are pink and non-injected, sclera clear OROPHARYNX:no exudate, no erythema and lips, buccal mucosa,  and tongue normal  NECK: supple, thyroid normal size, non-tender, without nodularity LYMPH:  no palpable lymphadenopathy in the cervical, axillary or inguinal LUNGS: clear to auscultation and percussion with normal breathing effort HEART: regular rate & rhythm and no murmurs and no lower extremity edema ABDOMEN:abdomen soft, non-tender and normal bowel sounds Musculoskeletal:no cyanosis of digits and no clubbing  NEURO: alert & oriented x 3 with fluent speech, no focal motor/sensory deficits  LABORATORY DATA:  I have reviewed the data as listed    Component Value Date/Time   NA 140 11/18/2016 0920   K 4.7 11/18/2016 0920   CL 105 07/31/2016 1146   CO2 27 11/18/2016 0920   GLUCOSE 82 11/18/2016 0920   BUN 18.6 11/18/2016 0920   CREATININE 0.7 11/18/2016 0920   CALCIUM 9.2 11/18/2016 0920   PROT 7.0 11/18/2016 0920   ALBUMIN 3.5 11/18/2016 0920   AST 12 11/18/2016 0920   ALT 12 11/18/2016 0920   ALKPHOS 59 11/18/2016 0920   BILITOT 0.33 11/18/2016 0920   GFRNONAA >60 07/31/2016 1146   GFRAA >60 07/31/2016 1146    No results found for: SPEP, UPEP  Lab Results  Component Value Date   WBC 6.2 11/18/2016   NEUTROABS 3.7 11/18/2016   HGB 11.5 (L) 11/18/2016   HCT 36.2 11/18/2016   MCV 89.4 11/18/2016   PLT 156 11/18/2016      Chemistry      Component Value Date/Time   NA 140 11/18/2016 0920   K 4.7 11/18/2016 0920   CL 105 07/31/2016 1146   CO2 27 11/18/2016 0920   BUN 18.6 11/18/2016 0920   CREATININE 0.7 11/18/2016 0920      Component Value Date/Time   CALCIUM 9.2 11/18/2016 0920   ALKPHOS 59 11/18/2016 0920   AST 12 11/18/2016 0920   ALT 12 11/18/2016 0920   BILITOT 0.33 11/18/2016 0920     ASSESSMENT & PLAN:  Endometrial cancer ([place]) Unfortunately, her treatment was complicated by severe peripheral neuropathy, grade 2 The patient is not comfortable to proceed with further treatment with paclitaxel I plan to omit paclitaxel and Neulasta injection In the  meantime, we will continue supportive care with gabapentin and pain management I plan to get her port removed at the end of the month  Peripheral neuropathy due to chemotherapy [facility]) She had severe peripheral neuropathy, stable while on gabapentin We discussed the risk and benefits of reducing chemotherapy or omission of treatment I do not believe omitting last dose of Taxol is going to make an impact on her longevity The patient is reassured I recommend increasing gabapentin to 600 mg twice a day I warned her about risk of sedation and constipation I will call her and of the month to reassess  White coat syndrome without diagnosis of hypertension Her blood pressure is elevated but she attributed that to anxiety We will continue to monitor her blood pressure closely   No orders of the defined types were placed in this encounter.  All questions were answered. The patient knows to call the clinic with any problems, questions or concerns. No  barriers to learning was detected. I spent 25 minutes counseling the patient face to face. The total time spent in the appointment was 30 minutes and more than 50% was on counseling and review of test results     Heath Lark, MD 11/18/2016 11:28 AM

## 2016-11-18 NOTE — Assessment & Plan Note (Signed)
Her blood pressure is elevated but she attributed that to anxiety We will continue to monitor her blood pressure closely

## 2016-11-18 NOTE — Assessment & Plan Note (Signed)
Unfortunately, her treatment was complicated by severe peripheral neuropathy, grade 2 The patient is not comfortable to proceed with further treatment with paclitaxel I plan to omit paclitaxel and Neulasta injection In the meantime, we will continue supportive care with gabapentin and pain management I plan to get her port removed at the end of the month

## 2016-11-18 NOTE — Assessment & Plan Note (Signed)
She had severe peripheral neuropathy, stable while on gabapentin We discussed the risk and benefits of reducing chemotherapy or omission of treatment I do not believe omitting last dose of Taxol is going to make an impact on her longevity The patient is reassured I recommend increasing gabapentin to 600 mg twice a day I warned her about risk of sedation and constipation I will call her and of the month to reassess

## 2016-11-19 ENCOUNTER — Ambulatory Visit (HOSPITAL_BASED_OUTPATIENT_CLINIC_OR_DEPARTMENT_OTHER): Payer: Managed Care, Other (non HMO)

## 2016-11-19 VITALS — BP 170/106 | HR 78 | Temp 98.1°F | Resp 20

## 2016-11-19 DIAGNOSIS — C541 Malignant neoplasm of endometrium: Secondary | ICD-10-CM | POA: Diagnosis not present

## 2016-11-19 DIAGNOSIS — C775 Secondary and unspecified malignant neoplasm of intrapelvic lymph nodes: Secondary | ICD-10-CM | POA: Diagnosis not present

## 2016-11-19 DIAGNOSIS — Z5111 Encounter for antineoplastic chemotherapy: Secondary | ICD-10-CM

## 2016-11-19 MED ORDER — SODIUM CHLORIDE 0.9 % IV SOLN
Freq: Once | INTRAVENOUS | Status: AC
  Administered 2016-11-19: 09:00:00 via INTRAVENOUS
  Filled 2016-11-19: qty 5

## 2016-11-19 MED ORDER — SODIUM CHLORIDE 0.9% FLUSH
10.0000 mL | INTRAVENOUS | Status: DC | PRN
  Administered 2016-11-19: 10 mL
  Filled 2016-11-19: qty 10

## 2016-11-19 MED ORDER — PALONOSETRON HCL INJECTION 0.25 MG/5ML
INTRAVENOUS | Status: AC
  Filled 2016-11-19: qty 5

## 2016-11-19 MED ORDER — SODIUM CHLORIDE 0.9 % IV SOLN
Freq: Once | INTRAVENOUS | Status: AC
  Administered 2016-11-19: 09:00:00 via INTRAVENOUS

## 2016-11-19 MED ORDER — PALONOSETRON HCL INJECTION 0.25 MG/5ML
0.2500 mg | Freq: Once | INTRAVENOUS | Status: AC
  Administered 2016-11-19: 0.25 mg via INTRAVENOUS

## 2016-11-19 MED ORDER — HEPARIN SOD (PORK) LOCK FLUSH 100 UNIT/ML IV SOLN
500.0000 [IU] | Freq: Once | INTRAVENOUS | Status: AC | PRN
  Administered 2016-11-19: 500 [IU]
  Filled 2016-11-19: qty 5

## 2016-11-19 MED ORDER — SODIUM CHLORIDE 0.9 % IV SOLN
750.0000 mg | Freq: Once | INTRAVENOUS | Status: AC
  Administered 2016-11-19: 750 mg via INTRAVENOUS
  Filled 2016-11-19: qty 75

## 2016-11-19 NOTE — Progress Notes (Signed)
Per Dr. Alvy Bimler, ok to treat patient with BP of 170/106.

## 2016-11-19 NOTE — Patient Instructions (Signed)
[facility] Discharge Instructions for Patients Receiving Chemotherapy  Today you received the following chemotherapy agents: Carboplatin.  To help prevent nausea and vomiting after your treatment, we encourage you to take your nausea medication as prescribed.   If you develop nausea and vomiting that is not controlled by your nausea medication, call the clinic.   BELOW ARE SYMPTOMS THAT SHOULD BE REPORTED IMMEDIATELY:  *FEVER GREATER THAN 100.5 F  *CHILLS WITH OR WITHOUT FEVER  NAUSEA AND VOMITING THAT IS NOT CONTROLLED WITH YOUR NAUSEA MEDICATION  *UNUSUAL SHORTNESS OF BREATH  *UNUSUAL BRUISING OR BLEEDING  TENDERNESS IN MOUTH AND THROAT WITH OR WITHOUT PRESENCE OF ULCERS  *URINARY PROBLEMS  *BOWEL PROBLEMS  UNUSUAL RASH Items with * indicate a potential emergency and should be followed up as soon as possible.  Feel free to call the clinic should you have any questions or concerns. The clinic phone number is [phone].  Please show the CHEMO ALERT CARD at check-in to the Emergency Department and triage nurse.     

## 2016-11-21 ENCOUNTER — Ambulatory Visit: Payer: Managed Care, Other (non HMO)

## 2016-11-23 ENCOUNTER — Telehealth: Payer: Self-pay | Admitting: *Deleted

## 2016-11-23 NOTE — Telephone Encounter (Signed)
Notified of message below. . Does not need refill at this time

## 2016-11-23 NOTE — Telephone Encounter (Signed)
Pt states she is taking the gabapentin 600 mg BID. She continues to wake up during night with leg pain. Is wondering if she can increase gabapentin.

## 2016-11-23 NOTE — Telephone Encounter (Signed)
OK to increase to 900 mg BID

## 2016-11-30 ENCOUNTER — Telehealth: Payer: Self-pay | Admitting: *Deleted

## 2016-11-30 NOTE — Telephone Encounter (Signed)
Notified of message below. States she has changed antidepressants from Buspar and Lexapro to Celexa. Will try gabapentin 600 mg TID and Advil PM

## 2016-11-30 NOTE — Telephone Encounter (Signed)
Pt states she is still having insomnia. Wants to know if she can take Advil PM. OR do you recommend another sleep aid for temporary use?     Gabapenitn is helping leg pain, notices that it wears off a few hours before the next dose is due.   Does she have to continue to use tylenol rather than advil? States advil has always given her more relief that tylenol.

## 2016-11-30 NOTE — Telephone Encounter (Signed)
1) I need to know if she has trouble falling asleep or not staying asleep 2) If she wants to try Advil PM I have no objection 3) She can increase gabapentin to 600 mg TID. OK to call in refill 4) No need tylenol if not helpful to her

## 2016-12-07 ENCOUNTER — Telehealth: Payer: Self-pay

## 2016-12-07 NOTE — Telephone Encounter (Signed)
Called with below message. Ask her to call nurse back.

## 2016-12-07 NOTE — Telephone Encounter (Signed)
-----   Message from Heath Lark, MD sent at 12/07/2016 10:08 AM EST ----- Regarding: how is her neuropathy? Can you call and ask how is she doing?

## 2016-12-07 NOTE — Telephone Encounter (Signed)
Called and left message that the neuropathy is about the same, not worse. Sleeping better. She is taking Gabapentin taking 3 times a day, she thinks that is helping some.

## 2016-12-09 ENCOUNTER — Telehealth: Payer: Self-pay | Admitting: *Deleted

## 2016-12-09 NOTE — Telephone Encounter (Signed)
Pt states she is still having pain- mainly in feet. Would be OK to increase gabapentin if appropriate.   Says she is feeling more stable walking, but has not left her home.   She does not need to see Dr Alvy Bimler before she sees Dr Denman George and would like to wait to have port removed after her appt with Dr Denman George.

## 2016-12-09 NOTE — Telephone Encounter (Signed)
-----   Message from Heath Lark, MD sent at 12/09/2016  9:12 AM EST ----- Regarding: how is she doing with neuropathy? Does she feel ok? Is she comfortable continue on same dose gabapentin? Does she wants to be seen or can wait until she sees Dr. Denman George? Also, recommend port removal soon

## 2016-12-09 NOTE — Telephone Encounter (Signed)
LM with note below. To call back to discuss

## 2016-12-11 ENCOUNTER — Telehealth: Payer: Self-pay

## 2016-12-11 NOTE — Telephone Encounter (Signed)
Called regarding Gabapentin. Can increase to 900 mg 2 x a day, watch out for constipation per Dr. Alvy Bimler. Keep hands and feet warm. Verbalized understanding.

## 2016-12-16 ENCOUNTER — Telehealth: Payer: Self-pay | Admitting: *Deleted

## 2016-12-16 NOTE — Telephone Encounter (Signed)
-----   Message from Heath Lark, MD sent at 12/16/2016  9:47 AM EST ----- Regarding: how is she doing? Can you check how is she doing with gabapentin and neuropathy?

## 2016-12-16 NOTE — Telephone Encounter (Signed)
Pt reports that the neuropathy is still the same. No other complaints.

## 2016-12-16 NOTE — Telephone Encounter (Signed)
LM with note below. Requested return call 

## 2016-12-28 ENCOUNTER — Encounter: Payer: Self-pay | Admitting: Gynecologic Oncology

## 2016-12-28 ENCOUNTER — Ambulatory Visit: Payer: Managed Care, Other (non HMO) | Attending: Gynecologic Oncology | Admitting: Gynecologic Oncology

## 2016-12-28 VITALS — BP 155/109 | HR 87 | Temp 98.5°F | Resp 99 | Ht 64.0 in | Wt 281.9 lb

## 2016-12-28 DIAGNOSIS — Z8542 Personal history of malignant neoplasm of other parts of uterus: Secondary | ICD-10-CM | POA: Diagnosis not present

## 2016-12-28 DIAGNOSIS — Z90722 Acquired absence of ovaries, bilateral: Secondary | ICD-10-CM | POA: Diagnosis not present

## 2016-12-28 DIAGNOSIS — Z808 Family history of malignant neoplasm of other organs or systems: Secondary | ICD-10-CM | POA: Diagnosis not present

## 2016-12-28 DIAGNOSIS — Z800 Family history of malignant neoplasm of digestive organs: Secondary | ICD-10-CM | POA: Insufficient documentation

## 2016-12-28 DIAGNOSIS — Z9049 Acquired absence of other specified parts of digestive tract: Secondary | ICD-10-CM | POA: Insufficient documentation

## 2016-12-28 DIAGNOSIS — C541 Malignant neoplasm of endometrium: Secondary | ICD-10-CM | POA: Diagnosis present

## 2016-12-28 DIAGNOSIS — Z8371 Family history of colonic polyps: Secondary | ICD-10-CM | POA: Insufficient documentation

## 2016-12-28 DIAGNOSIS — Z923 Personal history of irradiation: Secondary | ICD-10-CM

## 2016-12-28 DIAGNOSIS — Z90710 Acquired absence of both cervix and uterus: Secondary | ICD-10-CM | POA: Diagnosis not present

## 2016-12-28 DIAGNOSIS — F419 Anxiety disorder, unspecified: Secondary | ICD-10-CM | POA: Diagnosis not present

## 2016-12-28 DIAGNOSIS — Z79899 Other long term (current) drug therapy: Secondary | ICD-10-CM | POA: Insufficient documentation

## 2016-12-28 DIAGNOSIS — Z8042 Family history of malignant neoplasm of prostate: Secondary | ICD-10-CM | POA: Diagnosis not present

## 2016-12-28 DIAGNOSIS — Z803 Family history of malignant neoplasm of breast: Secondary | ICD-10-CM | POA: Diagnosis not present

## 2016-12-28 DIAGNOSIS — Z6841 Body Mass Index (BMI) 40.0 and over, adult: Secondary | ICD-10-CM | POA: Diagnosis not present

## 2016-12-28 DIAGNOSIS — Z9221 Personal history of antineoplastic chemotherapy: Secondary | ICD-10-CM

## 2016-12-28 DIAGNOSIS — I10 Essential (primary) hypertension: Secondary | ICD-10-CM | POA: Diagnosis not present

## 2016-12-28 NOTE — Progress Notes (Signed)
Consult Note: Gyn-Onc  Consult was requested by Dr. Ulanda Edison for the evaluation of Misty [name] 56 y.o. female  CC:  Chief Complaint  Patient presents with  . Endometrial cancer [facility])    Assessment/Plan:  Misty [name]  is a 56 y.o.  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  We discussed symptoms of recurrence and she will see me for these should they develop.  She will engage in 3 monthly surveillance exams beginning in March with Dr Sondra Come and will see me in 6 months.   It is reasonable to remove her port in the [place].  HPI: Misty [name] is a 56 year old woman who is seen in consultation at the request of Dr Ulanda Edison for grade 2 endometrial cancer.  The patient reports abnormal uterine bleeding for approximately 6 years (2-4 bleeding episodes per year). She had a particularly heavy bleeding episode on 05/21/16 and was seen in the ED. Hb was 7.2g/dL. She was started on Megace. Her bleeding improved.  [place] on 05/29/16 showed a uterus with 9x6x5.7cm dimensions and an endometrial stripe of 44mm.  Endometrial pipelle biopsy on 06/05/16 showed a grade 2 endometrial cancer.  Interval Hx:   On 06/25/16 she underwent robotic assisted total hysterectomy, BSO SLN biopsy. Final pathology showed a stage IIIC 1 grade 3 endometrioid endometrial cancer with deep myometrial invasion, + LVSI, and both right external iliac and right obturator SLN's (total of 5 in aggregate) positive for metastatic disease.   CT chest/abdo/pelvis on 07/08/16 showed no gross residual disease.  Postoperatively she has done well with no complaints.   She expressed deep concern about adjuvant therapies particularly with a concern that they may induce lymphedema that was seen in her sister after treatment for  breast cancer. She was considering no further treatment with the hope that she would have an enduring disease free interval (8+ years) with no side effects and good quality of life.  After hearing of the benefits of adjuvant therapy and consequences of declining therapy, she elected to undergo 6 cycles of carboplatin and paclitaxel with vaginal brachytherapy (30 Gy in 5 fractions). She completed therapy in November, 2018.  She tolerated therapy fairly well though she has some neuropathy.   Current Meds:  Outpatient Encounter Medications as of 12/28/2016  Medication Sig  . ALPRAZolam (XANAX) 0.5 MG tablet Take 0.25-0.5 mg by mouth daily as needed for anxiety (FOR PANIC/ANXIETY).   . citalopram (CELEXA) 40 MG tablet Take 40 mg daily by mouth.  . gabapentin (NEURONTIN) 300 MG capsule Take 2 capsules (600 mg total) 2 (two) times daily by mouth.  . lidocaine-prilocaine (EMLA) cream Apply 1 application topically as needed.  . lidocaine-prilocaine (EMLA) cream Apply to affected area once  . ondansetron (ZOFRAN) 8 MG tablet Take 1 tablet (8 mg total) by mouth 2 (two) times daily as needed for refractory nausea / vomiting. Start on day 3 after chemo. (Patient not taking: Reported on 11/02/2016)  . prochlorperazine (COMPAZINE) 10 MG tablet TAKE 1 TABLET BY MOUTH EVERY 6 HOURS AS NEEDED FOR NAUSEA AND VOMITING   No facility-administered encounter medications on file as of 12/28/2016.     Allergy: No Known Allergies  Social Hx:   Social History   Socioeconomic History  . Marital status: Married    Spouse name: Misty [name]  . Number of  children: 2  . Years of education: Not on file  . Highest education level: Not on file  Social Needs  . Financial resource strain: Not on file  . Food insecurity - worry: Not on file  . Food insecurity - inability: Not on file  . Transportation needs - medical: Not on file  . Transportation needs - non-medical: Not on file  Occupational History  . Occupation:  retired  Tobacco Use  . Smoking status: Never Smoker  . Smokeless tobacco: Never Used  Substance and Sexual Activity  . Alcohol use: Yes    Alcohol/week: 0.6 oz    Types: 1 Glasses of wine per week    Comment: Occas  . Drug use: No  . Sexual activity: Not on file  Other Topics Concern  . Not on file  Social History Narrative  . Not on file    Past Surgical Hx:  Past Surgical History:  Procedure Laterality Date  . CHOLECYSTECTOMY    . IR FLUORO GUIDE PORT INSERTION RIGHT  07/31/2016  . IR US GUIDE VASC ACCESS RIGHT  07/31/2016  . LYMPH NODE BIOPSY N/A 06/25/2016   Procedure: LYMPH NODE BIOPSY;  Surgeon: Everitt Amber, MD;  Location: [facility];  Service: Gynecology;  Laterality: N/A;  . ROBOTIC ASSISTED TOTAL HYSTERECTOMY WITH BILATERAL SALPINGO OOPHERECTOMY Bilateral 06/25/2016   Procedure: XI ROBOTIC ASSISTED TOTAL HYSTERECTOMY WITH BILATERAL SALPINGO OOPHORECTOMY for uterus greater 250 grams ;  Surgeon: Everitt Amber, MD;  Location: [facility];  Service: Gynecology;  Laterality: Bilateral;  . TONSILLECTOMY      Past Medical Hx:  Past Medical History:  Diagnosis Date  . Anxiety   . Endometrial cancer ([place])   . Family history of brain cancer   . Family history of breast cancer   . Family history of colon cancer   . Family history of colonic polyps   . Family history of prostate cancer   . History of radiation therapy 09/07/16-10/07/16   HDR to vaginal vault 30 gy in 5 fractions  . Hypertension     Past Gynecological History:  SVD x 2 Patient's last menstrual period was 05/31/2016 (approximate).  Family Hx:  Family History  Problem Relation Age of Onset  . Breast cancer Sister 32  . Colon cancer Paternal Grandfather 95  . Breast cancer Sister 75       Negative genetic testing on a 46 gene panel through [facility]  . Thyroid cancer Sister 36  . Fibroids Mother   . Colon polyps Father   . Healthy Brother   . Breast cancer Maternal Aunt 16  . Prostate cancer Maternal Uncle 64  .  Healthy Paternal Aunt   . Heart disease Maternal Grandmother   . Stroke Paternal Grandmother   . Colon polyps Sister   . Colon polyps Sister   . Healthy Sister   . Healthy Brother   . Brain cancer Maternal Aunt 55       died 3 days after diagnosis  . Breast cancer Maternal Aunt 80  . Healthy Paternal 10     Patient's oldest and youngest sister have history of premenopausal breast cancer (she is one of 8 siblings).  Review of Systems:  Constitutional  Feels well,    ENT Normal appearing ears and nares bilaterally Skin/Breast  No rash, sores, jaundice, itching, dryness Cardiovascular  No chest pain, shortness of breath, or edema  Pulmonary  No cough or wheeze.  Gastro Intestinal  No nausea, vomitting, or diarrhoea. No bright red  blood per rectum, no abdominal pain, change in bowel movement, or constipation.  Genito Urinary  No frequency, urgency, dysuria, no vaginal bleeding. Musculo Skeletal  No myalgia, arthralgia, joint swelling or pain  Neurologic  No weakness, numbness, change in gait,  Psychology  No depression, anxiety, insomnia.   labratory assessment: CBC    Component Value Date/Time   WBC 6.2 11/18/2016 0920   WBC 9.4 07/31/2016 1146   RBC 4.05 11/18/2016 0920   RBC 4.87 07/31/2016 1146   HGB 11.5 (L) 11/18/2016 0920   HCT 36.2 11/18/2016 0920   PLT 156 11/18/2016 0920   MCV 89.4 11/18/2016 0920   MCH 28.4 11/18/2016 0920   MCH 25.3 (L) 07/31/2016 1146   MCHC 31.8 11/18/2016 0920   MCHC 30.4 07/31/2016 1146   RDW 21.7 (H) 11/18/2016 0920   LYMPHSABS 1.4 11/18/2016 0920   MONOABS 1.0 (H) 11/18/2016 0920   EOSABS 0.0 11/18/2016 0920   BASOSABS 0.0 11/18/2016 0920    Vitals:  Blood pressure (!) 155/109, pulse 87, temperature 98.5 F (36.9 C), temperature source Oral, resp. rate (!) 99, height 5\' 4"  (1.626 m), weight 281 lb 14.4 oz (127.9 kg), last menstrual period 05/31/2016, SpO2 96 %.  Physical Exam: WD in NAD Neck  Supple NROM, without any  enlargements.  Lymph Node Survey No cervical supraclavicular or inguinal adenopathy Cardiovascular  Pulse normal rate, regularity and rhythm. S1 and S2 normal.  Lungs  Clear to auscultation bilateraly, without wheezes/crackles/rhonchi. Good air movement.  Skin  No rash/lesions/breakdown  Psychiatry  Alert and oriented to person, place, and time  Abdomen  Normoactive bowel sounds, abdomen soft, non-tender and obese with pannus without vidence of hernia. Incisions well healed. Back No CVA tenderness Genito Urinary  Vulva/vagina: Normal external female genitalia.  No lesions. No discharge or bleeding.  Bladder/urethra:  No lesions or masses, well supported bladder  Vagina: normal, some prolapse. No masses. Vaginal cuff well healed. No blood.  Adnexa: no discretely palpable masses. Rectal  deferred Extremities  No bilateral cyanosis, clubbing or edema.   Donaciano Eva, MD  12/28/2016, 3:59 PM

## 2016-12-28 NOTE — Patient Instructions (Signed)
Please notify Dr Denman George at phone number [phone] if you notice vaginal bleeding, new pelvic or abdominal pains, bloating, feeling full easy, or a change in bladder or bowel function.   Please return to see Dr Sondra Come as scheduled in March, 2019 and Dr Denman George in June, 2019.

## 2016-12-29 ENCOUNTER — Other Ambulatory Visit: Payer: Self-pay | Admitting: Hematology and Oncology

## 2016-12-29 DIAGNOSIS — C541 Malignant neoplasm of endometrium: Secondary | ICD-10-CM

## 2016-12-31 ENCOUNTER — Other Ambulatory Visit: Payer: Self-pay

## 2016-12-31 ENCOUNTER — Telehealth: Payer: Self-pay

## 2016-12-31 MED ORDER — GABAPENTIN 300 MG PO CAPS: 900.0000 mg | ORAL_CAPSULE | Freq: Two times a day (BID) | ORAL | 1 refills | Status: DC

## 2016-12-31 NOTE — Telephone Encounter (Signed)
Called patient to see how she is doing with neuropathy, she said it is the same.  Received refill request for Gabapentin 300 mg for 90 day supply. She would like a 90 day supply. She is taking 900 mg 2 x a day, per 11-30 message. She recently started taking extra vitamin B to help with neuropathy. She recently saw Dr. Denman George and port removal is scheduled, she wanted Dr. Alvy Bimler to be made aware.

## 2016-12-31 NOTE — Telephone Encounter (Signed)
Pls proceed to refill 900 mg BID PO for 90 days supply, 1 refill

## 2017-01-08 ENCOUNTER — Telehealth: Payer: Self-pay | Admitting: Student

## 2017-01-08 NOTE — Telephone Encounter (Signed)
Patient called with questions re: Port-A-Cath removal.  PA spoke to patient and answered all questions re: pre-procedure protocol, expectations, and procedure itself.  Patient verbalizes understanding.  Brynda Greathouse, MS RD PA-C 9:51 AM

## 2017-01-11 ENCOUNTER — Other Ambulatory Visit: Payer: Self-pay | Admitting: General Surgery

## 2017-01-13 ENCOUNTER — Ambulatory Visit (HOSPITAL_COMMUNITY)
Admission: RE | Admit: 2017-01-13 | Discharge: 2017-01-13 | Disposition: A | Discharge status: Home / Self Care | Payer: Managed Care, Other (non HMO) | Source: Ambulatory Visit | Attending: Hematology and Oncology | Admitting: Hematology and Oncology

## 2017-01-13 ENCOUNTER — Encounter (HOSPITAL_COMMUNITY): Payer: Self-pay

## 2017-01-13 DIAGNOSIS — Z923 Personal history of irradiation: Secondary | ICD-10-CM | POA: Insufficient documentation

## 2017-01-13 DIAGNOSIS — Z8542 Personal history of malignant neoplasm of other parts of uterus: Secondary | ICD-10-CM | POA: Diagnosis not present

## 2017-01-13 DIAGNOSIS — Z9221 Personal history of antineoplastic chemotherapy: Secondary | ICD-10-CM | POA: Diagnosis not present

## 2017-01-13 DIAGNOSIS — Z800 Family history of malignant neoplasm of digestive organs: Secondary | ICD-10-CM | POA: Diagnosis not present

## 2017-01-13 DIAGNOSIS — Z808 Family history of malignant neoplasm of other organs or systems: Secondary | ICD-10-CM | POA: Insufficient documentation

## 2017-01-13 DIAGNOSIS — C541 Malignant neoplasm of endometrium: Secondary | ICD-10-CM

## 2017-01-13 DIAGNOSIS — Z803 Family history of malignant neoplasm of breast: Secondary | ICD-10-CM | POA: Insufficient documentation

## 2017-01-13 DIAGNOSIS — Z452 Encounter for adjustment and management of vascular access device: Secondary | ICD-10-CM | POA: Insufficient documentation

## 2017-01-13 DIAGNOSIS — I10 Essential (primary) hypertension: Secondary | ICD-10-CM | POA: Diagnosis not present

## 2017-01-13 HISTORY — DX: Personal history of antineoplastic chemotherapy: Z92.21

## 2017-01-13 HISTORY — PX: IR REMOVAL TUN ACCESS W/ PORT W/O FL MOD SED: IMG2290

## 2017-01-13 LAB — CBC
HCT: 39.2 % (ref 36.0–46.0)
Hemoglobin: 12.9 g/dL (ref 12.0–15.0)
MCH: 31.5 pg (ref 26.0–34.0)
MCHC: 32.9 g/dL (ref 30.0–36.0)
MCV: 95.6 fL (ref 78.0–100.0)
Platelets: 230 10*3/uL (ref 150–400)
RBC: 4.1 MIL/uL (ref 3.87–5.11)
RDW: 17.5 % — ABNORMAL HIGH (ref 11.5–15.5)
WBC: 6.4 10*3/uL (ref 4.0–10.5)

## 2017-01-13 LAB — PROTIME-INR
INR: 1.03
PROTHROMBIN TIME: 13.4 s (ref 11.4–15.2)

## 2017-01-13 MED ORDER — CEFAZOLIN SODIUM-DEXTROSE 2-4 GM/100ML-% IV SOLN
INTRAVENOUS | Status: AC
  Administered 2017-01-13: 2 g via INTRAVENOUS
  Filled 2017-01-13: qty 100

## 2017-01-13 MED ORDER — FENTANYL CITRATE (PF) 100 MCG/2ML IJ SOLN
INTRAMUSCULAR | Status: AC | PRN
  Administered 2017-01-13 (×2): 50 ug via INTRAVENOUS

## 2017-01-13 MED ORDER — MIDAZOLAM HCL 2 MG/2ML IJ SOLN
INTRAMUSCULAR | Status: AC | PRN
  Administered 2017-01-13 (×2): 1 mg via INTRAVENOUS

## 2017-01-13 MED ORDER — SODIUM CHLORIDE 0.9 % IV SOLN
INTRAVENOUS | Status: DC
  Administered 2017-01-13: 12:00:00 via INTRAVENOUS

## 2017-01-13 MED ORDER — LIDOCAINE HCL 1 % IJ SOLN
INTRAMUSCULAR | Status: AC | PRN
  Administered 2017-01-13: 20 mL

## 2017-01-13 MED ORDER — CEFAZOLIN SODIUM-DEXTROSE 2-4 GM/100ML-% IV SOLN
2.0000 g | INTRAVENOUS | Status: AC
  Administered 2017-01-13: 2 g via INTRAVENOUS

## 2017-01-13 MED ORDER — FENTANYL CITRATE (PF) 100 MCG/2ML IJ SOLN
INTRAMUSCULAR | Status: AC
  Filled 2017-01-13: qty 2

## 2017-01-13 MED ORDER — MIDAZOLAM HCL 2 MG/2ML IJ SOLN
INTRAMUSCULAR | Status: AC
  Filled 2017-01-13: qty 2

## 2017-01-13 NOTE — H&P (Signed)
Chief Complaint: endometrial cancer  Referring Physician:Dr. Heath Lark  Supervising Physician: Daryll Brod  Patient Status: [facility] - Out-pt  HPI: Misty [name] is a 57 y.o. female with a history of endometrial cancer.  She has completed her treatment.  She had a PAC placed on 07-31-16 and presents today for removal.  She denies any complaints except neuropathy in her legs/feets and some in her finger tips from her chemotherapy.  Past Medical History:  Past Medical History:  Diagnosis Date  . Anxiety   . Endometrial cancer ([place])   . Family history of brain cancer   . Family history of breast cancer   . Family history of colon cancer   . Family history of colonic polyps   . Family history of prostate cancer   . History of chemotherapy   . History of radiation therapy 09/07/16-10/07/16   HDR to vaginal vault 30 gy in 5 fractions  . Hypertension     Past Surgical History:  Past Surgical History:  Procedure Laterality Date  . CHOLECYSTECTOMY    . IR FLUORO GUIDE PORT INSERTION RIGHT  07/31/2016  . IR US GUIDE VASC ACCESS RIGHT  07/31/2016  . LYMPH NODE BIOPSY N/A 06/25/2016   Procedure: LYMPH NODE BIOPSY;  Surgeon: Everitt Amber, MD;  Location: [facility];  Service: Gynecology;  Laterality: N/A;  . ROBOTIC ASSISTED TOTAL HYSTERECTOMY WITH BILATERAL SALPINGO OOPHERECTOMY Bilateral 06/25/2016   Procedure: XI ROBOTIC ASSISTED TOTAL HYSTERECTOMY WITH BILATERAL SALPINGO OOPHORECTOMY for uterus greater 250 grams ;  Surgeon: Everitt Amber, MD;  Location: [facility];  Service: Gynecology;  Laterality: Bilateral;  . TONSILLECTOMY      Family History:  Family History  Problem Relation Age of Onset  . Breast cancer Sister 33  . Colon cancer Paternal Grandfather 37  . Breast cancer Sister 48       Negative genetic testing on a 46 gene panel through [facility]  . Thyroid cancer Sister 21  . Fibroids Mother   . Colon polyps Father   . Healthy Brother   . Breast cancer Maternal Aunt 76  . Prostate  cancer Maternal Uncle 81  . Healthy Paternal Aunt   . Heart disease Maternal Grandmother   . Stroke Paternal Grandmother   . Colon polyps Sister   . Colon polyps Sister   . Healthy Sister   . Healthy Brother   . Brain cancer Maternal Aunt 55       died 3 days after diagnosis  . Breast cancer Maternal Aunt 80  . Healthy Paternal Aunt     Social History:  reports that  has never smoked. she has never used smokeless tobacco. She reports that she does not drink alcohol or use drugs.  Allergies: No Known Allergies  Medications: Medications reviewed in epic  Please HPI for pertinent positives, otherwise complete 10 system ROS negative.  Mallampati Score: MD Evaluation Airway: WNL Heart: WNL Abdomen: WNL Chest/ Lungs: WNL ASA  Classification: 2 Mallampati/Airway Score: Two  Physical Exam: BP (!) 162/119   Pulse 81   Temp 99.1 F (37.3 C) (Oral)   Resp (!) 81   LMP 05/31/2016 (Approximate)   SpO2 99%  There is no height or weight on file to calculate BMI. General: pleasant, morbidly obese white female who is laying in bed in NAD HEENT: head is normocephalic, atraumatic.  Sclera are noninjected.  PERRL.  Ears and nose without any masses or lesions.  Mouth is pink and moist Heart: regular, rate, and rhythm.  Normal s1,s2. No obvious murmurs, gallops, or rubs noted.  Palpable radial pulses bilaterally Lungs: CTAB, no wheezes, rhonchi, or rales noted.  Respiratory effort nonlabored.  PAC in chest Abd: soft, NT, ND, +BS, no masses, hernias, or organomegaly Psych: A&Ox3 with an appropriate affect.   Labs: Results for orders placed or performed during the hospital encounter of 01/13/17 (from the past 48 hour(s))  CBC     Status: Abnormal   Collection Time: 01/13/17 12:06 PM  Result Value Ref Range   WBC 6.4 4.0 - 10.5 K/uL   RBC 4.10 3.87 - 5.11 MIL/uL   Hemoglobin 12.9 12.0 - 15.0 g/dL   HCT 39.2 36.0 - 46.0 %   MCV 95.6 78.0 - 100.0 fL   MCH 31.5 26.0 - 34.0 pg   MCHC  32.9 30.0 - 36.0 g/dL   RDW 17.5 (H) 11.5 - 15.5 %   Platelets 230 150 - 400 K/uL  Protime-INR     Status: None   Collection Time: 01/13/17 12:06 PM  Result Value Ref Range   Prothrombin Time 13.4 11.4 - 15.2 seconds   INR 1.03     Imaging: No results found.  Assessment/Plan 1. Endometrial cancer  Plan to remove PAC today.  Labs and vitals reviewed.  The procedure as well as risks and complications including, but not limited to bleeding and infection were discussed.  She is agreeable to proceed and consent is signed and on the chart.  Thank you for this interesting consult.  I greatly enjoyed meeting Curator and look forward to participating in their care.  A copy of this report was sent to the requesting provider on this date.  Electronically Signed: Henreitta Cea 01/13/2017, 12:45 PM   I spent a total of  30 Minutes   in face to face in clinical consultation, greater than 50% of which was counseling/coordinating care for endometrial carcinoma

## 2017-01-13 NOTE — Procedures (Signed)
S/p RT IJ PORT REMOVAL  NO COMP STABLE EBL 0 FULL REPORT IN PACS

## 2017-01-13 NOTE — Discharge Instructions (Signed)
You may remove dressing and bathe in 24 hours.  ° °Implanted Port Removal, Care After °Refer to this sheet in the next few weeks. These instructions provide you with information about caring for yourself after your procedure. Your health care provider may also give you more specific instructions. Your treatment has been planned according to current medical practices, but problems sometimes occur. Call your health care provider if you have any problems or questions after your procedure. °What can I expect after the procedure? °After the procedure, it is common to have: °· Soreness or pain near your incision. °· Some swelling or bruising near your incision. ° °Follow these instructions at home: °Medicines °· Take over-the-counter and prescription medicines only as told by your health care provider. °· If you were prescribed an antibiotic medicine, take it as told by your health care provider. Do not stop taking the antibiotic even if you start to feel better. °Bathing °· Do not take baths, swim, or use a hot tub until your health care provider approves. Ask your health care provider if you can take showers. You may only be allowed to take sponge baths for bathing. °Incision care °· Follow instructions from your health care provider about how to take care of your incision. Make sure you: °? Wash your hands with soap and water before you change your bandage (dressing). If soap and water are not available, use hand sanitizer. °? Change your dressing as told by your health care provider. °? Keep your dressing dry. °? Leave stitches (sutures), skin glue, or adhesive strips in place. These skin closures may need to stay in place for 2 weeks or longer. If adhesive strip edges start to loosen and curl up, you may trim the loose edges. Do not remove adhesive strips completely unless your health care provider tells you to do that. °· Check your incision area every day for signs of infection. Check for: °? More redness,  swelling, or pain. °? More fluid or blood. °? Warmth. °? Pus or a bad smell. °Driving °· If you received a sedative, do not drive for 24 hours after the procedure. °· If you did not receive a sedative, ask your health care provider when it is safe to drive. °Activity °· Return to your normal activities as told by your health care provider. Ask your health care provider what activities are safe for you. °· Until your health care provider says it is safe: °? Do not lift anything that is heavier than 10 lb (4.5 kg). °? Do not do activities that involve lifting your arms over your head. °General instructions °· Do not use any tobacco products, such as cigarettes, chewing tobacco, and e-cigarettes. Tobacco can delay healing. If you need help quitting, ask your health care provider. °· Keep all follow-up visits as told by your health care provider. This is important. °Contact a health care provider if: °· You have more redness, swelling, or pain around your incision. °· You have more fluid or blood coming from your incision. °· Your incision feels warm to the touch. °· You have pus or a bad smell coming from your incision. °· You have a fever. °· You have pain that is not relieved by your pain medicine. °Get help right away if: °· You have chest pain. °· You have difficulty breathing. °This information is not intended to replace advice given to you by your health care provider. Make sure you discuss any questions you have with your health care provider. °Document   Released: 12/10/2014 Document Revised: 06/06/2015 Document Reviewed: 10/03/2014 °Elsevier Interactive Patient Education © 2018 [employer] ° ° °Moderate Conscious Sedation, Adult, Care After °These instructions provide you with information about caring for yourself after your procedure. Your health care provider may also give you more specific instructions. Your treatment has been planned according to current medical practices, but problems sometimes occur.  Call your health care provider if you have any problems or questions after your procedure. °What can I expect after the procedure? °After your procedure, it is common: °· To feel sleepy for several hours. °· To feel clumsy and have poor balance for several hours. °· To have poor judgment for several hours. °· To vomit if you eat too soon. ° °Follow these instructions at home: °For at least 24 hours after the procedure: ° °· Do not: °? Participate in activities where you could fall or become injured. °? Drive. °? Use heavy machinery. °? Drink alcohol. °? Take sleeping pills or medicines that cause drowsiness. °? Make important decisions or sign legal documents. °? Take care of children on your own. °· Rest. °Eating and drinking °· Follow the diet recommended by your health care provider. °· If you vomit: °? Drink water, juice, or soup when you can drink without vomiting. °? Make sure you have little or no nausea before eating solid foods. °General instructions °· Have a responsible adult stay with you until you are awake and alert. °· Take over-the-counter and prescription medicines only as told by your health care provider. °· If you smoke, do not smoke without supervision. °· Keep all follow-up visits as told by your health care provider. This is important. °Contact a health care provider if: °· You keep feeling nauseous or you keep vomiting. °· You feel light-headed. °· You develop a rash. °· You have a fever. °Get help right away if: °· You have trouble breathing. °This information is not intended to replace advice given to you by your health care provider. Make sure you discuss any questions you have with your health care provider. °Document Released: 10/19/2012 Document Revised: 06/03/2015 Document Reviewed: 04/20/2015 °Elsevier Interactive Patient Education © 2018 [employer]vier Inc[employer] ° °

## 2017-02-08 ENCOUNTER — Telehealth: Payer: Self-pay | Admitting: *Deleted

## 2017-02-08 ENCOUNTER — Other Ambulatory Visit: Payer: Self-pay | Admitting: Hematology and Oncology

## 2017-02-08 ENCOUNTER — Encounter: Payer: Self-pay | Admitting: Neurology

## 2017-02-08 DIAGNOSIS — G620 Drug-induced polyneuropathy: Secondary | ICD-10-CM

## 2017-02-08 DIAGNOSIS — T451X5A Adverse effect of antineoplastic and immunosuppressive drugs, initial encounter: Principal | ICD-10-CM

## 2017-02-08 NOTE — Telephone Encounter (Signed)
Neuropathy can take up to 1 year to resolve, worse because of winter She can be referred to neurologist for further discussion if she would like

## 2017-02-08 NOTE — Telephone Encounter (Signed)
Notified of message below.  Would referral to neurologist.

## 2017-02-08 NOTE — Telephone Encounter (Signed)
Is also taking Vitamin B and Vitamin D.

## 2017-02-08 NOTE — Telephone Encounter (Signed)
Referral sent 

## 2017-02-08 NOTE — Telephone Encounter (Signed)
Pt left a message states her neuropathy has not improved. Is currently taking Gabapentin 900mg  BID. Is also taking extra strength tylenol. Last chemo was in early November. She wants to know if she needs to see a neurologist.

## 2017-03-25 ENCOUNTER — Telehealth: Payer: Self-pay

## 2017-03-25 NOTE — Telephone Encounter (Signed)
She called and left a message to call her.   Called back. She is having a lot of neuropathy discomfort and having trouble sleeping.  She has started having stabbing shooting pain in her feet and up her legs. She tried tylenol pm for awhile but it did not help so she stopped. She is currently taking tylenol around the clock and Gabapentin 900 mg am and pm, for a total of 1800 mg a day.  She is asking, can she increase it to 1200 mg am and pm?  She sees the neurologist 4/29 and that is the earliest she can be seen. If she cannot increase it, she needs a explanation on why. So she can explain it to her husband. If it is increased she needs a new Rx.

## 2017-03-26 ENCOUNTER — Other Ambulatory Visit: Payer: Self-pay

## 2017-03-26 MED ORDER — GABAPENTIN 300 MG PO CAPS: 900.0000 mg | ORAL_CAPSULE | Freq: Three times a day (TID) | ORAL | 0 refills | Status: DC

## 2017-03-26 NOTE — Telephone Encounter (Signed)
Called and left message. Ask her to call nurse back.

## 2017-03-26 NOTE — Telephone Encounter (Signed)
Called back with below message, she would like to try 900 mg tid. Rx sent to pharmacy.

## 2017-03-26 NOTE — Telephone Encounter (Signed)
It actually makes more sense to try 900 mg TID first If she is willing to try, please send prescription to pharmacy

## 2017-04-05 ENCOUNTER — Ambulatory Visit
Admission: RE | Admit: 2017-04-05 | Discharge: 2017-04-05 | Disposition: A | Discharge status: Home / Self Care | Payer: Managed Care, Other (non HMO) | Source: Ambulatory Visit | Attending: Radiation Oncology | Admitting: Radiation Oncology

## 2017-04-05 ENCOUNTER — Encounter: Payer: Self-pay | Admitting: Radiation Oncology

## 2017-04-05 ENCOUNTER — Other Ambulatory Visit: Payer: Self-pay

## 2017-04-05 VITALS — BP 149/82 | HR 96 | Temp 98.5°F | Resp 18 | Wt 248.4 lb

## 2017-04-05 DIAGNOSIS — G629 Polyneuropathy, unspecified: Secondary | ICD-10-CM | POA: Diagnosis not present

## 2017-04-05 DIAGNOSIS — C541 Malignant neoplasm of endometrium: Secondary | ICD-10-CM | POA: Diagnosis not present

## 2017-04-05 DIAGNOSIS — Z79899 Other long term (current) drug therapy: Secondary | ICD-10-CM | POA: Diagnosis not present

## 2017-04-05 NOTE — Progress Notes (Signed)
Misty [name] is here today for her follow-up appointment. Denies any pain or fatigue. Denies any nausea or vomiting. Denies any vaginal or rectal bleeding. Denies any vaginal discharge. States that she get constipation some. States that she is eating a fiber supplement to help with constipation.Denies any pain with urination.Denies any skin irritation.States that she has finish chemotherapy. States that she is currently sexual active so she is not using the dilator. Vitals:   04/05/17 1503  BP: (!) 149/82  Pulse: 96  Resp: 18  Temp: 98.5 F (36.9 C)  TempSrc: Oral  SpO2: 100%  Weight: 248 lb 6 oz (112.7 kg)   Wt Readings from Last 3 Encounters:  04/05/17 248 lb 6 oz (112.7 kg)  12/28/16 281 lb 14.4 oz (127.9 kg)  11/18/16 295 lb 12.8 oz (134.2 kg)

## 2017-04-05 NOTE — Progress Notes (Signed)
Radiation Oncology         [phone] ________________________________  Name: Misty [name] MRN: [id number]  Date: 04/05/2017  DOB: 11-Apr-1960  Follow-Up Visit Note  CC: Patient, No Pcp Per  Everitt Amber, MD    ICD-10-CM   1. 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  Narrative:  The patient returns today for routine follow-up. The patient completed 6 cycles of chemotherapy in November 2018. The patient reports she did not receive Taxol on the 6th cycle due to neuropathy. She was last seen by Dr. Denman George on 12/31/16. Per her note, there was no evidence of recurrence. The patient reports she is sexually active so she is not using her dilator.   She reports occasional constipation that she manages with a fiber supplement. She complains of neuropathy in her bilateral feet that results in difficulty walking. She is managing this with Gabapentin. She denies pain, fatigue, nausea/vomiting, vaginal/rectal bleeding, vaginal discharge, bladder issues, abdominal bloating, back/flank pain, or skin irritation.                               ALLERGIES:  has No Known Allergies.  Meds: Current Outpatient Medications  Medication Sig Dispense Refill  . acetaminophen (TYLENOL) 500 MG tablet Take 1,000 mg by mouth every 6 (six) hours as needed.    . ALPRAZolam (XANAX) 0.5 MG tablet Take 0.25-0.5 mg by mouth daily as needed for anxiety (FOR PANIC/ANXIETY).     . citalopram (CELEXA) 40 MG tablet Take 40 mg daily by mouth.  1  . gabapentin (NEURONTIN) 300 MG capsule Take 3 capsules (900 mg total) by mouth 3 (three) times daily. 810 capsule 0  . lidocaine-prilocaine (EMLA) cream Apply 1 application topically as needed. (Patient not taking: Reported on 04/05/2017) 30 g 3  . prochlorperazine (COMPAZINE) 10 MG tablet TAKE 1 TABLET BY MOUTH  EVERY 6 HOURS AS NEEDED FOR NAUSEA AND VOMITING (Patient not taking: Reported on 04/05/2017) 60 tablet 1   No current facility-administered medications for this encounter.     Physical Findings: The patient is in no acute distress. Patient is alert and oriented.  weight is 248 lb 6 oz (112.7 kg). Her oral temperature is 98.5 F (36.9 C). Her blood pressure is 149/82 (abnormal) and her pulse is 96. Her respiration is 18 and oxygen saturation is 100%. .  Lungs are clear to auscultation bilaterally. Heart has regular rate and rhythm. No palpable cervical, supraclavicular, or axillary adenopathy. Abdomen soft, non-tender, normal bowel sounds.   On pelvic examination the external genitalia were unremarkable. A speculum exam was performed.  It is difficult getting a good view of the vaginal cuff due to the patient's body habitus. On bimanual examination, the cuff was intact with no palpable nodularity.    Lab Findings: Lab Results  Component Value Date   WBC 6.4 01/13/2017   HGB 12.9 01/13/2017   HCT 39.2 01/13/2017   MCV 95.6 01/13/2017   PLT 230 01/13/2017    Radiographic Findings: No results found.  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 No evidence of recurrence on clinical exam.  Plan:  Routine follow-up in radiation oncology in 6 months. The patient will follow-up with Dr. Denman George on 06/30/17.  -----------------------------------  Blair Promise, PhD, MD  This document serves as a record of services personally performed by Gery Pray, MD. It was created on his behalf by Bethann Humble, a trained medical scribe. The creation of this record is based on the scribe's personal observations and the provider's statements to them. This document has been checked and approved by the attending provider.

## 2017-05-07 NOTE — Progress Notes (Signed)
[facility] Clinic Note - Initial Visit   Date: 05/10/17  Misty [name] MRN: [id number] DOB: [name]   Dear Dr. Ernst Spell:  Thank you for your kind referral of Misty [name] for consultation of chemotherapy induced neuropathy. Although her history is well known to you, please allow [place] to reiterate it for the purpose of our medical record. The patient was accompanied to the clinic by husband who also provides collateral information.     History of Present Illness: Misty [name] is a 57 y.o. right-handed Caucasian female with endometrial cancer s/p robotic assisted total hysterectomy and BSO, radiation and chemotheray, anxiety and panic attacks presenting for evaluation of bilateral hand and feet paresthesia.    She was diagnosed with endometrial cancer in June 2018 and started on chemotherapy with carboplatin and taxol in July - November 2018.  Prior to her 5th chemotherapy cycle, she started having numbness and tingling of the fingers and feet which progressed after her 5th session, so her taxol was discontinued.  She was able to complete her chemotherapy. Since this time, she has constant cold-burning sensation of the feet to the level of the ankles.  She also has intermittent shooting and stabbing pain.  She takes gabapentin 900mg  three times daily, which signficantly improves her shooting pain. She does not have any relief with her constant burning pain.  She walks unassisted and has some imbalance.  Fortunately, no falls.  She has weakness of the hands and has difficulty opening water bottles and drops objects.    Prior to diagnosis of endometrial cancer, she was drinking at least two glasses of wine per day for many years.  No history of diabetes, thyroid disease, or family history of neuropathy.  Out-side paper records, electronic medical record, and images have been reviewed where available and summarized as:  Lab Results  Component Value Date   WBC 6.4  01/13/2017   HGB 12.9 01/13/2017   HCT 39.2 01/13/2017   MCV 95.6 01/13/2017   PLT 230 01/13/2017   Lab Results  Component Value Date   CREATININE 0.7 11/18/2016   BUN 18.6 11/18/2016   NA 140 11/18/2016   K 4.7 11/18/2016   CL 105 07/31/2016   CO2 27 11/18/2016     Past Medical History:  Diagnosis Date  . Anxiety   . Endometrial cancer ([place])   . Family history of brain cancer   . Family history of breast cancer   . Family history of colon cancer   . Family history of colonic polyps   . Family history of prostate cancer   . History of chemotherapy   . History of radiation therapy 09/07/16-10/07/16   HDR to vaginal vault 30 gy in 5 fractions  . Hypertension     Past Surgical History:  Procedure Laterality Date  . CHOLECYSTECTOMY    . IR FLUORO GUIDE PORT INSERTION RIGHT  07/31/2016  . IR REMOVAL TUN ACCESS W/ PORT W/O FL MOD SED  01/13/2017  . IR US GUIDE VASC ACCESS RIGHT  07/31/2016  . LYMPH NODE BIOPSY N/A 06/25/2016   Procedure: LYMPH NODE BIOPSY;  Surgeon: Everitt Amber, MD;  Location: [facility];  Service: Gynecology;  Laterality: N/A;  . ROBOTIC ASSISTED TOTAL HYSTERECTOMY WITH BILATERAL SALPINGO OOPHERECTOMY Bilateral 06/25/2016   Procedure: XI ROBOTIC ASSISTED TOTAL HYSTERECTOMY WITH BILATERAL SALPINGO OOPHORECTOMY for uterus greater 250 grams ;  Surgeon: Everitt Amber, MD;  Location: [facility];  Service: Gynecology;  Laterality: Bilateral;  . TONSILLECTOMY  Medications:  Outpatient Encounter Medications as of 05/10/2017  Medication Sig  . acetaminophen (TYLENOL) 500 MG tablet Take 1,000 mg by mouth every 6 (six) hours as needed.  . ALPRAZolam (XANAX) 0.5 MG tablet Take 0.25-0.5 mg by mouth daily as needed for anxiety (FOR PANIC/ANXIETY).   . citalopram (CELEXA) 40 MG tablet Take 40 mg daily by mouth.  . gabapentin (NEURONTIN) 300 MG capsule Take 3 capsules (900 mg total) by mouth 3 (three) times daily.  . prochlorperazine (COMPAZINE) 10 MG tablet TAKE 1 TABLET BY MOUTH  EVERY 6 HOURS AS NEEDED FOR NAUSEA AND VOMITING (Patient not taking: Reported on 04/05/2017)  . [DISCONTINUED] lidocaine-prilocaine (EMLA) cream Apply 1 application topically as needed. (Patient not taking: Reported on 04/05/2017)   No facility-administered encounter medications on file as of 05/10/2017.     Allergies: No Known Allergies  Family History: Family History  Problem Relation Age of Onset  . Breast cancer Sister 38  . Colon cancer Paternal Grandfather 81  . Breast cancer Sister 95       Negative genetic testing on a 46 gene panel through [facility]  . Thyroid cancer Sister 73  . Fibroids Mother   . Colon polyps Father   . Healthy Brother   . Breast cancer Maternal Aunt 71  . Prostate cancer Maternal Uncle 43  . Healthy Paternal Aunt   . Heart disease Maternal Grandmother   . Stroke Paternal Grandmother   . Colon polyps Sister   . Colon polyps Sister   . Healthy Sister   . Healthy Brother   . Brain cancer Maternal Aunt 55       died 3 days after diagnosis  . Breast cancer Maternal Aunt 80  . Healthy Paternal Aunt     Social History: Social History   Tobacco Use  . Smoking status: Never Smoker  . Smokeless tobacco: Never Used  Substance Use Topics  . Alcohol use: No    Alcohol/week: 0.6 oz    Types: 1 Glasses of wine per week    Frequency: Never  . Drug use: No   Social History   Social History Narrative   Lives with husband in a 2 story home.  Has 2 sons.  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     Review of Systems:  CONSTITUTIONAL: No fevers, chills, night sweats, or weight loss.   EYES: No visual changes or eye pain ENT: No hearing changes.  No history of nose bleeds.   RESPIRATORY: No cough, wheezing and shortness of breath.   CARDIOVASCULAR: Negative for chest pain, and palpitations.   GI: Negative for abdominal discomfort, blood in stools or black stools.  No recent change in bowel habits.   GU:  No history of  incontinence.   MUSCLOSKELETAL: No history of joint pain or swelling.  No myalgias.   SKIN: Negative for lesions, rash, and itching.   HEMATOLOGY/ONCOLOGY: Negative for prolonged bleeding, bruising easily, and swollen nodes.  +history of cancer.   ENDOCRINE: Negative for cold or heat intolerance, polydipsia or goiter.   PSYCH:  +depression or anxiety symptoms.   NEURO: As Above.   Vital Signs:  BP 130/70   Pulse 84   Ht 5\' 4"  (1.626 m)   Wt 243 lb 8 oz (110.5 kg)   LMP 05/31/2016 (Approximate)   SpO2 100%   BMI 41.80 kg/m    General Medical Exam:  General:  Well appearing, obese, comfortable.   Eyes/ENT: see cranial  nerve examination.   Neck: No masses appreciated.  Full range of motion without tenderness.  No carotid bruits. Respiratory:  Clear to auscultation, good air entry bilaterally.   Cardiac:  Regular rate and rhythm, no murmur.   Extremities:  No deformities, edema, or skin discoloration.  Skin:  No rashes or lesions.  Neurological Exam: MENTAL STATUS including orientation to time, place, person, recent and remote memory, attention span and concentration, language, and fund of knowledge is normal.  Speech is not dysarthric.  CRANIAL NERVES: II:  No visual field defects.  Unremarkable fundi.   III-IV-VI: Pupils equal round and reactive to light.  Normal conjugate, extra-ocular eye movements in all directions of gaze.  No nystagmus.  No ptosis.   V:  Normal facial sensation.   VII:  Normal facial symmetry and movements.  VIII:  Normal hearing and vestibular function.   IX-X:  Normal palatal movement.   XI:  Normal shoulder shrug and head rotation.   XII:  Normal tongue strength and range of motion, no deviation or fasciculation.  MOTOR:  Mild intrinsic hand muscle atrophy, no fasciculations or abnormal movements.  No pronator drift.  Tone is normal.    Right Upper Extremity:    Left Upper Extremity:    Deltoid  5/5   Deltoid  5/5   Biceps  5/5   Biceps  5/5     Triceps  5/5   Triceps  5/5   Wrist extensors  5/5   Wrist extensors  5/5   Wrist flexors  5/5   Wrist flexors  5/5   Finger extensors  5/5   Finger extensors  5/5   Finger flexors  5/5   Finger flexors  5/5   Dorsal interossei  4+/5   Dorsal interossei  4+/5   Abductor pollicis  4/5   Abductor pollicis  5-/5   Tone (Ashworth scale)  0  Tone (Ashworth scale)  0   Right Lower Extremity:    Left Lower Extremity:    Hip flexors  5/5   Hip flexors  5/5   Hip extensors  5/5   Hip extensors  5/5   Knee flexors  5/5   Knee flexors  5/5   Knee extensors  5/5   Knee extensors  5/5   Dorsiflexors  5-/5   Dorsiflexors  5-/5   Plantarflexors  5-/5   Plantarflexors  5-/5   Toe extensors  5-/5   Toe extensors  4+/5   Toe flexors  5-/5   Toe flexors  5-/5   Tone (Ashworth scale)  0  Tone (Ashworth scale)  0   MSRs:  Right                                                                 Left brachioradialis 2+  brachioradialis 2+  biceps 2+  biceps 2+  triceps 2+  triceps 2+  patellar 2+  patellar 2+  ankle jerk 0  ankle jerk 0  Hoffman no  Hoffman no  plantar response down  plantar response down   SENSORY: Sensation intact to all modalities in the hands, absent vibration distal to ankles bilaterally; temperature and pin prick is diminished distal to ankles.   COORDINATION/GAIT: Normal finger-to- nose-finger.  Intact rapid alternating  movements bilaterally.  Gait is wide-based and unsteady, assisted by husband.     IMPRESSION: Chemotherapy-induced neuropathy due taxol and carboplatin, last session in November 2018.  Exam shows stocking-glove pattern of sensorimotor deficits consistent with her complaints.  I had extensive discussion with the patient regarding the pathogenesis, etiology, management, and natural course of neuropathy. She was drinking wine regularly and it is possible that she may have some degree of preexisting nerve pathology which was exacerbated by chemotherapy. We discussed  that neurological recovery can take years and if there is axon loss, some of her deficits may be lasting.  NCS/EMG of the right arm and leg will be ordered to assess prognosis and evaluate for superimposed right CTS given her asymmetrical R abductor pollicis weakness.  To be complete, I would like to test for treatable causes of neuropathy.  Pain is currently well-controlled on gabapentin 900mg  TID.  She had many questions which were answered to the best of my ability.    PLAN/RECOMMENDATIONS:  Check HbA1c, TSH, vitamin B12, vitamin B1, copper, folate NCS/EMG right arm and leg Continue gabapentin 900mg  three times daily Home PT for balance training declined  Return to clinic in 6 months.   Thank you for allowing me to participate in patient's care.  If I can answer any additional questions, I would be pleased to do so.    Sincerely,    Shloimy Michalski K. Posey Pronto, DO

## 2017-05-10 ENCOUNTER — Ambulatory Visit (INDEPENDENT_AMBULATORY_CARE_PROVIDER_SITE_OTHER): Payer: Managed Care, Other (non HMO) | Admitting: Neurology

## 2017-05-10 ENCOUNTER — Encounter: Payer: Self-pay | Admitting: Neurology

## 2017-05-10 ENCOUNTER — Other Ambulatory Visit (INDEPENDENT_AMBULATORY_CARE_PROVIDER_SITE_OTHER): Payer: Managed Care, Other (non HMO)

## 2017-05-10 VITALS — BP 130/70 | HR 84 | Ht 64.0 in | Wt 243.5 lb

## 2017-05-10 DIAGNOSIS — G620 Drug-induced polyneuropathy: Secondary | ICD-10-CM

## 2017-05-10 DIAGNOSIS — Z131 Encounter for screening for diabetes mellitus: Secondary | ICD-10-CM

## 2017-05-10 DIAGNOSIS — Z87898 Personal history of other specified conditions: Secondary | ICD-10-CM | POA: Diagnosis not present

## 2017-05-10 DIAGNOSIS — T451X5A Adverse effect of antineoplastic and immunosuppressive drugs, initial encounter: Secondary | ICD-10-CM

## 2017-05-10 LAB — HEMOGLOBIN A1C: Hgb A1c MFr Bld: 5.4 % (ref 4.6–6.5)

## 2017-05-10 LAB — TIQ-NTM

## 2017-05-10 LAB — FOLATE: Folate: 12.2 ng/mL (ref >5.9)

## 2017-05-10 LAB — VITAMIN B12

## 2017-05-10 LAB — TSH: TSH: 1.91 u[IU]/mL (ref 0.35–4.50)

## 2017-05-10 NOTE — Patient Instructions (Addendum)
Check labs  NCS/EMG of the right arm and leg  Please call my office if you would like to start home physical therapy  Return to clinic in 6 months

## 2017-05-13 LAB — COPPER, SERUM: COPPER: 103 ug/dL (ref 70–175)

## 2017-05-14 ENCOUNTER — Telehealth: Payer: Self-pay | Admitting: *Deleted

## 2017-05-14 LAB — VITAMIN B1: Vitamin B1 (Thiamine): 202 nmol/L — ABNORMAL HIGH (ref 8–30)

## 2017-05-14 NOTE — Telephone Encounter (Signed)
Left message giving patient results.  

## 2017-05-14 NOTE — Telephone Encounter (Signed)
-----   Message from Alda Berthold, DO sent at 05/14/2017 11:34 AM EDT ----- Please notify patient lab are within normal limits.  Thank you.

## 2017-06-03 ENCOUNTER — Encounter: Payer: Managed Care, Other (non HMO) | Admitting: Neurology

## 2017-06-16 ENCOUNTER — Telehealth: Payer: Self-pay | Admitting: *Deleted

## 2017-06-16 NOTE — Telephone Encounter (Signed)
Called and left the patient a message to call the office back. The appt for June 19th needs to be moved.

## 2017-06-18 ENCOUNTER — Telehealth: Payer: Self-pay

## 2017-06-18 ENCOUNTER — Other Ambulatory Visit: Payer: Self-pay | Admitting: Hematology and Oncology

## 2017-06-18 NOTE — Telephone Encounter (Signed)
She called and left message to call her.  Called back. She picked up her Neurontin today at [facility]. The Rx says to take 3 capsules bid for total of 6 a day. Instead of 3 capsules tid as usual. Instructed her to call [facility] and clarify Rx. To call office if needed, the only Rx I can see is the 3 capsules tid.

## 2017-06-21 ENCOUNTER — Encounter: Payer: Self-pay | Admitting: Gynecologic Oncology

## 2017-06-21 ENCOUNTER — Inpatient Hospital Stay: Payer: Managed Care, Other (non HMO)

## 2017-06-21 ENCOUNTER — Inpatient Hospital Stay: Payer: Managed Care, Other (non HMO) | Attending: Gynecologic Oncology | Admitting: Gynecologic Oncology

## 2017-06-21 ENCOUNTER — Telehealth: Payer: Self-pay

## 2017-06-21 VITALS — BP 108/84 | HR 73 | Temp 98.4°F | Resp 20 | Ht 64.0 in | Wt 238.8 lb

## 2017-06-21 DIAGNOSIS — C541 Malignant neoplasm of endometrium: Secondary | ICD-10-CM | POA: Diagnosis not present

## 2017-06-21 DIAGNOSIS — Z923 Personal history of irradiation: Secondary | ICD-10-CM | POA: Insufficient documentation

## 2017-06-21 DIAGNOSIS — Z90722 Acquired absence of ovaries, bilateral: Secondary | ICD-10-CM | POA: Insufficient documentation

## 2017-06-21 DIAGNOSIS — Z9221 Personal history of antineoplastic chemotherapy: Secondary | ICD-10-CM | POA: Diagnosis not present

## 2017-06-21 DIAGNOSIS — Z90710 Acquired absence of both cervix and uterus: Secondary | ICD-10-CM | POA: Insufficient documentation

## 2017-06-21 LAB — BASIC METABOLIC PANEL
Anion gap: 10 (ref 3–11)
BUN: 18 mg/dL (ref 7–26)
CHLORIDE: 107 mmol/L (ref 98–109)
CO2: 24 mmol/L (ref 22–29)
CREATININE: 0.75 mg/dL (ref 0.60–1.10)
Calcium: 9.9 mg/dL (ref 8.4–10.4)
GFR calc non Af Amer: >60 mL/min (ref >60)
Glucose, Bld: 86 mg/dL (ref 70–140)
Potassium: 4.3 mmol/L (ref 3.5–5.1)
Sodium: 141 mmol/L (ref 136–145)

## 2017-06-21 NOTE — Progress Notes (Signed)
Follow-up Note: Gyn-Onc  Consult was requested by Dr. Ulanda Edison for the evaluation of Misty [name] 57 y.o. female  CC:  Chief Complaint  Patient presents with  . Endometrial cancer [facility])    Assessment/Plan:  Misty [name]  is a 57 y.o.  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  No active disease that is measurable.  We discussed symptoms of recurrence and she will see me for these should they develop.  She will continue in 3 monthly surveillance exams beginning in March with Dr Sondra Come and will see me in 6 months.   Repeat CT scan to re-evaluate prominent/indeterminate lymph nodes.   HPI: Misty [name] is a 57 year old woman who is seen in consultation at the request of Dr Ulanda Edison for grade 2 endometrial cancer.  The patient reports abnormal uterine bleeding for approximately 6 years (2-4 bleeding episodes per year). She had a particularly heavy bleeding episode on 05/21/16 and was seen in the ED. Hb was 7.2g/dL. She was started on Megace. Her bleeding improved.  [place] on 05/29/16 showed a uterus with 9x6x5.7cm dimensions and an endometrial stripe of 51mm.  Endometrial pipelle biopsy on 06/05/16 showed a grade 2 endometrial cancer.   On 06/25/16 she underwent robotic assisted total hysterectomy, BSO SLN biopsy. Final pathology showed a stage IIIC 1 grade 3 endometrioid endometrial cancer with deep myometrial invasion, + LVSI, and both right external iliac and right obturator SLN's (total of 5 in aggregate) positive for metastatic disease.   CT chest/abdo/pelvis on 07/08/16 showed no gross residual disease.  Postoperatively she has done well with no complaints.   She expressed deep concern about adjuvant therapies particularly with a concern that they may induce lymphedema that  was seen in her sister after treatment for breast cancer. She was considering no further treatment with the hope that she would have an enduring disease free interval (8+ years) with no side effects and good quality of life.  After hearing of the benefits of adjuvant therapy and consequences of declining therapy, she elected to undergo 6 cycles of carboplatin and paclitaxel with vaginal brachytherapy (30 Gy in 5 fractions). She completed therapy in November, 2018.  Interval Hx:  She tolerated therapy fairly well though she has some neuropathy. She has gas, but denies constipation.   She had indeterminate lymph nodes seen (pelvic) on her pre-treatment imaging in June, 2018.  Current Meds:  Outpatient Encounter Medications as of 06/21/2017  Medication Sig  . acetaminophen (TYLENOL) 500 MG tablet Take 1,000 mg by mouth every 6 (six) hours as needed.  . ALPRAZolam (XANAX) 0.5 MG tablet Take 0.25-0.5 mg by mouth daily as needed for anxiety (FOR PANIC/ANXIETY).   . citalopram (CELEXA) 40 MG tablet Take 40 mg daily by mouth.  . gabapentin (NEURONTIN) 300 MG capsule TAKE 3 CAPSULES (900 MG TOTAL) BY MOUTH 3 (THREE) TIMES DAILY.  . [DISCONTINUED] prochlorperazine (COMPAZINE) 10 MG tablet TAKE 1 TABLET BY MOUTH EVERY 6 HOURS AS NEEDED FOR NAUSEA AND VOMITING (Patient not taking: Reported on 04/05/2017)   No facility-administered encounter medications on file as of 06/21/2017.     Allergy: No Known Allergies  Social Hx:   Social History   Socioeconomic History  . Marital status: Married    Spouse name: [name]  . Number of children: 2  . Years of education: Not  on file  . Highest education level: Professional school degree (e.g., MD, DDS, DVM, JD)  Occupational History  . Occupation: retired  Scientific laboratory technician  . Financial resource strain: Not on file  . Food insecurity:    Worry: Not on file    Inability: Not on file  . Transportation needs:    Medical: Not on file    Non-medical: Not on file   Tobacco Use  . Smoking status: Never Smoker  . Smokeless tobacco: Never Used  Substance and Sexual Activity  . Alcohol use: No    Alcohol/week: 0.6 oz    Types: 1 Glasses of wine per week    Frequency: Never  . Drug use: No  . Sexual activity: Not on file  Lifestyle  . Physical activity:    Days per week: Not on file    Minutes per session: Not on file  . Stress: Not on file  Relationships  . Social connections:    Talks on phone: Not on file    Gets together: Not on file    Attends religious service: Not on file    Active member of club or organization: Not on file    Attends meetings of clubs or organizations: Not on file    Relationship status: Not on file  . Intimate partner violence:    Fear of current or ex partner: Not on file    Emotionally abused: Not on file    Physically abused: Not on file    Forced sexual activity: Not on file  Other Topics Concern  . Not on file  Social History Narrative   Lives with husband in a 2 story home.  Has 2 sons.  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     Past Surgical Hx:  Past Surgical History:  Procedure Laterality Date  . CHOLECYSTECTOMY    . IR FLUORO GUIDE PORT INSERTION RIGHT  07/31/2016  . IR REMOVAL TUN ACCESS W/ PORT W/O FL MOD SED  01/13/2017  . IR US GUIDE VASC ACCESS RIGHT  07/31/2016  . LYMPH NODE BIOPSY N/A 06/25/2016   Procedure: LYMPH NODE BIOPSY;  Surgeon: Everitt Amber, MD;  Location: [facility];  Service: Gynecology;  Laterality: N/A;  . ROBOTIC ASSISTED TOTAL HYSTERECTOMY WITH BILATERAL SALPINGO OOPHERECTOMY Bilateral 06/25/2016   Procedure: XI ROBOTIC ASSISTED TOTAL HYSTERECTOMY WITH BILATERAL SALPINGO OOPHORECTOMY for uterus greater 250 grams ;  Surgeon: Everitt Amber, MD;  Location: [facility];  Service: Gynecology;  Laterality: Bilateral;  . TONSILLECTOMY      Past Medical Hx:  Past Medical History:  Diagnosis Date  . Anxiety   . Endometrial cancer ([place])   . Family history of brain  cancer   . Family history of breast cancer   . Family history of colon cancer   . Family history of colonic polyps   . Family history of prostate cancer   . History of chemotherapy   . History of radiation therapy 09/07/16-10/07/16   HDR to vaginal vault 30 gy in 5 fractions  . Hypertension     Past Gynecological History:  SVD x 2 Patient's last menstrual period was 05/31/2016 (approximate).  Family Hx:  Family History  Problem Relation Age of Onset  . Breast cancer Sister 96  . Colon cancer Paternal Grandfather 48  . Breast cancer Sister 36       Negative genetic testing on a 46 gene panel through [facility]  . Thyroid cancer Sister 49  .  Fibroids Mother   . Colon polyps Father   . Healthy Brother   . Breast cancer Maternal Aunt 6  . Prostate cancer Maternal Uncle 10  . Healthy Paternal Aunt   . Heart disease Maternal Grandmother   . Stroke Paternal Grandmother   . Colon polyps Sister   . Colon polyps Sister   . Healthy Sister   . Healthy Brother   . Brain cancer Maternal Aunt 55       died 3 days after diagnosis  . Breast cancer Maternal Aunt 80  . Healthy Paternal 9     Patient's oldest and youngest sister have history of premenopausal breast cancer (she is one of 8 siblings).  Review of Systems:  Constitutional  Feels well,    ENT Normal appearing ears and nares bilaterally Skin/Breast  No rash, sores, jaundice, itching, dryness Cardiovascular  No chest pain, shortness of breath, or edema  Pulmonary  No cough or wheeze.  Gastro Intestinal  No nausea, vomitting, or diarrhoea. No bright red blood per rectum, no abdominal pain, change in bowel movement, or constipation. + flatulence Genito Urinary  No frequency, urgency, dysuria, no vaginal bleeding. Musculo Skeletal  No myalgia, arthralgia, joint swelling or pain  Neurologic  No weakness, numbness, change in gait,  Psychology  No depression, anxiety, insomnia.   labratory assessment: CBC    Component  Value Date/Time   WBC 6.4 01/13/2017 1206   RBC 4.10 01/13/2017 1206   HGB 12.9 01/13/2017 1206   HGB 11.5 (L) 11/18/2016 0920   HCT 39.2 01/13/2017 1206   HCT 36.2 11/18/2016 0920   PLT 230 01/13/2017 1206   PLT 156 11/18/2016 0920   MCV 95.6 01/13/2017 1206   MCV 89.4 11/18/2016 0920   MCH 31.5 01/13/2017 1206   MCHC 32.9 01/13/2017 1206   RDW 17.5 (H) 01/13/2017 1206   RDW 21.7 (H) 11/18/2016 0920   LYMPHSABS 1.4 11/18/2016 0920   MONOABS 1.0 (H) 11/18/2016 0920   EOSABS 0.0 11/18/2016 0920   BASOSABS 0.0 11/18/2016 0920    Vitals:  Blood pressure 108/84, pulse 73, temperature 98.4 F (36.9 C), temperature source Oral, resp. rate 20, height 5\' 4"  (1.626 m), weight 238 lb 12.8 oz (108.3 kg), last menstrual period 05/31/2016, SpO2 98 %.  Physical Exam: WD in NAD Neck  Supple NROM, without any enlargements.  Lymph Node Survey No cervical supraclavicular or inguinal adenopathy Cardiovascular  Pulse normal rate, regularity and rhythm. S1 and S2 normal.  Lungs  Clear to auscultation bilateraly, without wheezes/crackles/rhonchi. Good air movement.  Skin  No rash/lesions/breakdown  Psychiatry  Alert and oriented to person, place, and time  Abdomen  Normoactive bowel sounds, abdomen soft, non-tender and obese with pannus with candida without vidence of hernia. Incisions well healed. Back No CVA tenderness Genito Urinary  Vulva/vagina: Normal external female genitalia.  No lesions. No discharge or bleeding.  Bladder/urethra:  No lesions or masses, well supported bladder  Vagina: normal, some prolapse. No masses. Vaginal cuff well healed. No blood. No visible lesions  Adnexa: no discretely palpable masses. Rectal  deferred Extremities  No bilateral cyanosis, clubbing or edema.   Thereasa Solo, MD  06/21/2017, 2:34 PM

## 2017-06-21 NOTE — Telephone Encounter (Signed)
Outgoing call Per Dr Clabe Seal nurse - next appt is 9-16 at 11:30 arrive at least 15 min early- no answer, left pt VM with appt info.

## 2017-06-21 NOTE — Patient Instructions (Addendum)
Please notify Dr Denman George at phone number [phone] if you notice vaginal bleeding, new pelvic or abdominal pains, bloating, feeling full easy, or a change in bladder or bowel function.   Please return to see Dr Denman George in 6 months and Dr Sondra Come in 3 months.  Dr Denman George has ordered a CT scan to monitor the status of the lymph nodes seen on prior study.   GasX or Beano can be used for gas as needed.

## 2017-06-24 ENCOUNTER — Telehealth: Payer: Self-pay

## 2017-06-24 ENCOUNTER — Encounter (HOSPITAL_COMMUNITY): Payer: Self-pay

## 2017-06-24 ENCOUNTER — Ambulatory Visit (HOSPITAL_COMMUNITY)
Admission: RE | Admit: 2017-06-24 | Discharge: 2017-06-24 | Disposition: A | Discharge status: Home / Self Care | Payer: Managed Care, Other (non HMO) | Source: Ambulatory Visit | Attending: Gynecologic Oncology | Admitting: Gynecologic Oncology

## 2017-06-24 DIAGNOSIS — I700 Atherosclerosis of aorta: Secondary | ICD-10-CM | POA: Insufficient documentation

## 2017-06-24 DIAGNOSIS — C541 Malignant neoplasm of endometrium: Secondary | ICD-10-CM | POA: Diagnosis not present

## 2017-06-24 DIAGNOSIS — K4090 Unilateral inguinal hernia, without obstruction or gangrene, not specified as recurrent: Secondary | ICD-10-CM | POA: Insufficient documentation

## 2017-06-24 DIAGNOSIS — K449 Diaphragmatic hernia without obstruction or gangrene: Secondary | ICD-10-CM | POA: Diagnosis not present

## 2017-06-24 MED ORDER — IOPAMIDOL (ISOVUE-300) INJECTION 61%
INTRAVENOUS | Status: AC
  Filled 2017-06-24: qty 100

## 2017-06-24 MED ORDER — IOPAMIDOL (ISOVUE-300) INJECTION 61%
100.0000 mL | Freq: Once | INTRAVENOUS | Status: AC | PRN
  Administered 2017-06-24: 100 mL via INTRAVENOUS

## 2017-06-24 NOTE — Telephone Encounter (Signed)
Incoming call from pt, returning our call.  Per Joylene John NP results of recent CT Abd/Pelvis given to patient "No signs of cancer, 2 hernias seen, hiatal hernia and right groin inguinal hernia that contains fat".  Pt voiced understanding and verbalized she thinks she had heard about the hiatal hernia before from a previous scan but wasn't aware of the other one at her inguinal area. I let her know that Melissa NP said we could send the report to her family doctor and/or general surgeon to further evaluate.  Pt said she doesn't have a primary physician at this time and would like a referral to the general surgeon that her husband had but she can't remember the name and wants to call us back. I let her know just to call us back as soon as she can with her preferred general surgeon.  Pt voiced understanding, no other needs at this time.

## 2017-06-24 NOTE — Telephone Encounter (Signed)
Outgoing call per Joylene John NP regarding results of recent CT Abd/Pelvis "No signs of cancer, 2 hernias seen, hiatal hernia and right groin hernia that contains fat"  - no answer, left VM with contact info for patient to return our call.

## 2017-06-28 ENCOUNTER — Telehealth: Payer: Self-pay

## 2017-06-28 NOTE — Telephone Encounter (Signed)
Received incoming call from patient and patient reports she figured out that her husband's surgeon was Dr Lucia Gaskins from [facility] and she would like referral there to further discuss her CT results (hiatal and inguinal hernia).  Notified Joylene John NP and ok per her to proceed with referral. Recent note from Dr Denman George, [place] results, and insurance information faxed to their office.  Per Lenna Sciara NP, notify [facility] nurse navigator to follow up on- regarding pt's appt there- done.

## 2017-06-29 ENCOUNTER — Telehealth: Payer: Self-pay | Admitting: Oncology

## 2017-06-29 ENCOUNTER — Telehealth: Payer: Self-pay

## 2017-06-29 ENCOUNTER — Other Ambulatory Visit: Payer: Self-pay | Admitting: Gynecologic Oncology

## 2017-06-29 DIAGNOSIS — M7989 Other specified soft tissue disorders: Secondary | ICD-10-CM

## 2017-06-29 NOTE — Telephone Encounter (Signed)
Incoming call from patient regarding she hasn't received call from [facility] yet for referral appt. Notified [facility] Nurse Navigator. She spoke with [facility] and then patient and gave pt appt time /date. Patient also reported she is concerned about some swelling and pain in her right calf.  Denies redness or excessive warmth in that area.  Pt could not specify exact length of time she has had swelling but has been more recent. Notified Dr Rossi/ Joylene John NP.  Orders for VAS U/S lower extremity received and scheduled pt for tomorrow 6-19 at 2 pm.  This is the best time for pt because of husband's schedule.  Encouraged pt to call us back if increasing pain, redness, warmth, swelling before appt tomorrow or if after hours, she would have to go to ER.  Pt voiced understanding. No other needs per pt at this time.

## 2017-06-29 NOTE — Telephone Encounter (Signed)
Notified Misty [name] of appointment with Dr. Lucia Gaskins on 07/28/17 at 9:30 am.  Also advised her that she is on the waiting list and that Dr. Pollie Friar nurse has been contacted about moving the appointment up.  She was given [facility]'s phone number in case she needs to contact them.

## 2017-06-30 ENCOUNTER — Ambulatory Visit (HOSPITAL_COMMUNITY)
Admission: RE | Admit: 2017-06-30 | Discharge: 2017-06-30 | Disposition: A | Discharge status: Home / Self Care | Payer: Managed Care, Other (non HMO) | Source: Ambulatory Visit | Attending: Gynecologic Oncology | Admitting: Gynecologic Oncology

## 2017-06-30 ENCOUNTER — Telehealth: Payer: Self-pay

## 2017-06-30 ENCOUNTER — Ambulatory Visit: Payer: Managed Care, Other (non HMO) | Admitting: Gynecologic Oncology

## 2017-06-30 DIAGNOSIS — M7989 Other specified soft tissue disorders: Secondary | ICD-10-CM | POA: Insufficient documentation

## 2017-06-30 DIAGNOSIS — M79661 Pain in right lower leg: Secondary | ICD-10-CM | POA: Diagnosis not present

## 2017-06-30 DIAGNOSIS — C541 Malignant neoplasm of endometrium: Secondary | ICD-10-CM | POA: Diagnosis not present

## 2017-06-30 DIAGNOSIS — G629 Polyneuropathy, unspecified: Secondary | ICD-10-CM | POA: Diagnosis not present

## 2017-06-30 DIAGNOSIS — Z9221 Personal history of antineoplastic chemotherapy: Secondary | ICD-10-CM | POA: Diagnosis not present

## 2017-06-30 DIAGNOSIS — Z923 Personal history of irradiation: Secondary | ICD-10-CM | POA: Diagnosis not present

## 2017-06-30 NOTE — Telephone Encounter (Signed)
Outgoing call to patient per Joylene John NP regarding results of VAS U/S of lower extremity are "negative for DVT, no intervention, just monitor for now".  Pt voiced understanding of results and verbalized she has our contact info if has  worsening of symptoms. No other needs per pt at this time.

## 2017-06-30 NOTE — Progress Notes (Signed)
Preliminary notes--Right lower extremity venous duplex exam completed. Negative for DVT.  Result called ordering provider NP Cross and patient was instructed to go home.   Hongying Landry Mellow (RDMS RVT) 06/30/17 2:29 PM

## 2017-07-16 ENCOUNTER — Emergency Department (HOSPITAL_COMMUNITY): Payer: Managed Care, Other (non HMO)

## 2017-07-16 ENCOUNTER — Other Ambulatory Visit: Payer: Self-pay

## 2017-07-16 ENCOUNTER — Emergency Department (HOSPITAL_COMMUNITY)
Admission: EM | Admit: 2017-07-16 | Discharge: 2017-07-17 | Disposition: A | Discharge status: Home / Self Care | Payer: Managed Care, Other (non HMO) | Attending: Emergency Medicine | Admitting: Emergency Medicine

## 2017-07-16 ENCOUNTER — Encounter (HOSPITAL_COMMUNITY): Payer: Self-pay

## 2017-07-16 DIAGNOSIS — K4030 Unilateral inguinal hernia, with obstruction, without gangrene, not specified as recurrent: Secondary | ICD-10-CM

## 2017-07-16 DIAGNOSIS — Z79899 Other long term (current) drug therapy: Secondary | ICD-10-CM | POA: Diagnosis not present

## 2017-07-16 DIAGNOSIS — I10 Essential (primary) hypertension: Secondary | ICD-10-CM | POA: Insufficient documentation

## 2017-07-16 DIAGNOSIS — R109 Unspecified abdominal pain: Secondary | ICD-10-CM | POA: Diagnosis present

## 2017-07-16 DIAGNOSIS — B372 Candidiasis of skin and nail: Secondary | ICD-10-CM | POA: Insufficient documentation

## 2017-07-16 DIAGNOSIS — B3789 Other sites of candidiasis: Secondary | ICD-10-CM

## 2017-07-16 LAB — COMPREHENSIVE METABOLIC PANEL
ALBUMIN: 4.1 g/dL (ref 3.5–5.0)
ALT: 15 U/L (ref 0–44)
ANION GAP: 13 (ref 5–15)
AST: 17 U/L (ref 15–41)
Alkaline Phosphatase: 57 U/L (ref 38–126)
BILIRUBIN TOTAL: 0.5 mg/dL (ref 0.3–1.2)
BUN: 18 mg/dL (ref 6–20)
CO2: 21 mmol/L — ABNORMAL LOW (ref 22–32)
Calcium: 9.6 mg/dL (ref 8.9–10.3)
Chloride: 106 mmol/L (ref 98–111)
Creatinine, Ser: 0.74 mg/dL (ref 0.44–1.00)
GFR calc Af Amer: >60 mL/min (ref >60)
GLUCOSE: 129 mg/dL — AB (ref 70–99)
POTASSIUM: 4.3 mmol/L (ref 3.5–5.1)
Sodium: 140 mmol/L (ref 135–145)
TOTAL PROTEIN: 7.1 g/dL (ref 6.5–8.1)

## 2017-07-16 LAB — CBC WITH DIFFERENTIAL/PLATELET
BASOS ABS: 0 10*3/uL (ref 0.0–0.1)
Basophils Relative: 0 %
Eosinophils Absolute: 0.1 10*3/uL (ref 0.0–0.7)
Eosinophils Relative: 1 %
HEMATOCRIT: 47.9 % — AB (ref 36.0–46.0)
Hemoglobin: 15.7 g/dL — ABNORMAL HIGH (ref 12.0–15.0)
LYMPHS PCT: 15 %
Lymphs Abs: 1.7 10*3/uL (ref 0.7–4.0)
MCH: 32.5 pg (ref 26.0–34.0)
MCHC: 32.8 g/dL (ref 30.0–36.0)
MCV: 99.2 fL (ref 78.0–100.0)
Monocytes Absolute: 0.9 10*3/uL (ref 0.1–1.0)
Monocytes Relative: 9 %
NEUTROS ABS: 8.3 10*3/uL — AB (ref 1.7–7.7)
Neutrophils Relative %: 75 %
Platelets: 260 10*3/uL (ref 150–400)
RBC: 4.83 MIL/uL (ref 3.87–5.11)
RDW: 14.2 % (ref 11.5–15.5)
WBC: 11 10*3/uL — AB (ref 4.0–10.5)

## 2017-07-16 LAB — I-STAT CG4 LACTIC ACID, ED
LACTIC ACID, VENOUS: 1.04 mmol/L (ref 0.5–1.9)
Lactic Acid, Venous: 1.11 mmol/L (ref 0.5–1.9)

## 2017-07-16 MED ORDER — IOPAMIDOL (ISOVUE-300) INJECTION 61%
INTRAVENOUS | Status: AC
  Filled 2017-07-16: qty 100

## 2017-07-16 MED ORDER — ONDANSETRON HCL 4 MG/2ML IJ SOLN
4.0000 mg | Freq: Once | INTRAMUSCULAR | Status: AC
  Administered 2017-07-16: 4 mg via INTRAVENOUS
  Filled 2017-07-16: qty 2

## 2017-07-16 MED ORDER — IOPAMIDOL (ISOVUE-300) INJECTION 61%
100.0000 mL | Freq: Once | INTRAVENOUS | Status: AC | PRN
  Administered 2017-07-16: 100 mL via INTRAVENOUS

## 2017-07-16 NOTE — ED Provider Notes (Signed)
[facility] DEPT Provider Note   CSN: [id number] Arrival date & time: 07/16/17  1952     History   Chief Complaint Chief Complaint  Patient presents with  . Abdominal Pain    HPI Misty [name] is a 57 y.o. female.  The history is provided by the patient and medical records.  Abdominal Pain   Associated symptoms include nausea.     57 year old female with history of anxiety, endometrial cancer status post hysterectomy, hypertension, peripheral neuropathy, presenting to the ED with right lower abdominal pain.  Patient reports she had a CT scan last month for surveillance of her endometrial cancer.  States she was told that time she had a hiatal hernia as well as a large right inguinal hernia.  States over the past 24 hours she has had increasing pain in her right lower abdomen.  States she feels something that feels like a "tennis ball" that does not reduce.  States she is also started to notice some redness of the skin in her groin.  She denies any fever or chills.  Has had some nausea today but denies vomiting.  Has been able to eat and drink well.  Has had normal bowel movements.  She is scheduled to see a surgeon for repair on 07/28/2017.  She is also status post cholecystectomy.  She did take her gabapentin and extra strength tylenol prior to arrival, pain fairly controlled currently.  Past Medical History:  Diagnosis Date  . Anxiety   . Endometrial cancer ([place])   . Family history of brain cancer   . Family history of breast cancer   . Family history of colon cancer   . Family history of colonic polyps   . Family history of prostate cancer   . History of chemotherapy   . History of radiation therapy 09/07/16-10/07/16   HDR to vaginal vault 30 gy in 5 fractions  . Hypertension     Patient Active Problem List   Diagnosis Date Noted  . Peripheral neuropathy due to chemotherapy ([place]) 10/09/2016  . Preventive measure 10/09/2016  . Genetic  testing 08/28/2016  . Port catheter in place 08/25/2016  . White coat syndrome without diagnosis of hypertension 08/25/2016  . Nausea without vomiting 08/25/2016  . Family history of breast cancer   . Family history of colon cancer   . Family history of colonic polyps   . Family history of brain cancer   . Family history of prostate cancer   . Family history of cancer 07/23/2016  . 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 Endometrial cancer ([place]) 06/17/2016  . Morbid obesity with BMI of 50.0-59.9, adult ([place]) 06/17/2016    Past Surgical History:  Procedure Laterality Date  . CHOLECYSTECTOMY    . IR FLUORO GUIDE PORT INSERTION RIGHT  07/31/2016  . IR REMOVAL TUN ACCESS W/ PORT W/O FL MOD SED  01/13/2017  . IR US GUIDE VASC ACCESS RIGHT  07/31/2016  . LYMPH NODE BIOPSY N/A 06/25/2016   Procedure: LYMPH NODE BIOPSY;  Surgeon: Everitt Amber, MD;  Location: [facility];  Service: Gynecology;  Laterality: N/A;  . ROBOTIC ASSISTED TOTAL HYSTERECTOMY WITH BILATERAL SALPINGO OOPHERECTOMY Bilateral 06/25/2016   Procedure: XI ROBOTIC ASSISTED TOTAL HYSTERECTOMY WITH BILATERAL SALPINGO OOPHORECTOMY for uterus greater 250 grams ;  Surgeon: Everitt Amber, MD;  Location: [facility];  Service: Gynecology;  Laterality: Bilateral;  . TONSILLECTOMY       OB  History    Gravida  2   Para  2   Term  2   Preterm      AB      Living  2     SAB      TAB      Ectopic      Multiple      Live Births               Home Medications    Prior to Admission medications   Medication Sig Start Date End Date Taking? Authorizing Provider  acetaminophen (TYLENOL) 500 MG tablet Take 1,000 mg by mouth every 6 (six) hours as needed.    [provider]  ALPRAZolam Duanne Moron) 0.5 MG tablet Take 0.25-0.5 mg by mouth daily as needed for anxiety (FOR PANIC/ANXIETY).     [provider]  citalopram (CELEXA) 40 MG tablet  Take 40 mg daily by mouth. 11/25/16   [provider]  gabapentin (NEURONTIN) 300 MG capsule TAKE 3 CAPSULES (900 MG TOTAL) BY MOUTH 3 (THREE) TIMES DAILY. 06/18/17 09/16/17  Heath Lark, MD    Family History Family History  Problem Relation Age of Onset  . Breast cancer Sister 23  . Colon cancer Paternal Grandfather 104  . Breast cancer Sister 65       Negative genetic testing on a 46 gene panel through [facility]  . Thyroid cancer Sister 35  . Fibroids Mother   . Colon polyps Father   . Healthy Brother   . Breast cancer Maternal Aunt 40  . Prostate cancer Maternal Uncle 69  . Healthy Paternal Aunt   . Heart disease Maternal Grandmother   . Stroke Paternal Grandmother   . Colon polyps Sister   . Colon polyps Sister   . Healthy Sister   . Healthy Brother   . Brain cancer Maternal Aunt 55       died 3 days after diagnosis  . Breast cancer Maternal Aunt 80  . Healthy Paternal Aunt     Social History Social History   Tobacco Use  . Smoking status: Never Smoker  . Smokeless tobacco: Never Used  Substance Use Topics  . Alcohol use: No    Alcohol/week: 0.6 oz    Types: 1 Glasses of wine per week    Frequency: Never  . Drug use: No     Allergies   Patient has no known allergies.   Review of Systems Review of Systems  Gastrointestinal: Positive for abdominal pain and nausea.  All other systems reviewed and are negative.    Physical Exam Updated Vital Signs BP (!) 160/121 (BP Location: Left Arm)   Pulse 80   Temp 97.9 F (36.6 C) (Axillary)   Resp 16   Ht 5\' 3"  (1.6 m)   Wt 108 kg (238 lb)   LMP 05/31/2016 (Approximate)   SpO2 95%   BMI 42.16 kg/m   Physical Exam  Constitutional: She is oriented to person, place, and time. She appears well-developed and well-nourished.  HENT:  Head: Normocephalic and atraumatic.  Mouth/Throat: Oropharynx is clear and moist.  Eyes: Pupils are equal, round, and reactive to light. Conjunctivae and EOM are normal.    Neck: Normal range of motion.  Cardiovascular: Normal rate, regular rhythm and normal heart sounds.  Pulmonary/Chest: Effort normal and breath sounds normal.  Abdominal: Soft. Bowel sounds are normal. There is tenderness in the right lower quadrant.  Hernia palpable, somewhat mobile but not completely reducible  Genitourinary:  Genitourinary  Comments: Candidal rash in the skin fold of right groin  Musculoskeletal: Normal range of motion.  Neurological: She is alert and oriented to person, place, and time.  Skin: Skin is warm and dry.  Psychiatric: She has a normal mood and affect.  Nursing note and vitals reviewed.    ED Treatments / Results  Labs (all labs ordered are listed, but only abnormal results are displayed) Labs Reviewed  COMPREHENSIVE METABOLIC PANEL - Abnormal; Notable for the following components:      Result Value   CO2 21 (*)    Glucose, Bld 129 (*)    All other components within normal limits  CBC WITH DIFFERENTIAL/PLATELET - Abnormal; Notable for the following components:   WBC 11.0 (*)    Hemoglobin 15.7 (*)    HCT 47.9 (*)    Neutro Abs 8.3 (*)    All other components within normal limits  URINALYSIS, ROUTINE W REFLEX MICROSCOPIC  I-STAT CG4 LACTIC ACID, ED  I-STAT CG4 LACTIC ACID, ED    EKG None  Radiology Ct Abdomen Pelvis W Contrast  Result Date: 07/16/2017 CLINICAL DATA:  57 y/o F; history of 2 hiatal hernias and 1 inguinal hernia. Increasing pain, size, and redness to the right lower abdomen going down the leg. History of endometrial cancer post radiation therapy. EXAM: CT ABDOMEN AND PELVIS WITH CONTRAST TECHNIQUE: Multidetector CT imaging of the abdomen and pelvis was performed using the standard protocol following bolus administration of intravenous contrast. CONTRAST:  100 cc Isovue-300 COMPARISON:  06/24/2017 CT abdomen and pelvis. FINDINGS: Lower chest: Stable moderate hiatal hernia. Hepatobiliary: No focal liver abnormality is seen. Status post  cholecystectomy. No biliary dilatation. Pancreas: Unremarkable. No pancreatic ductal dilatation or surrounding inflammatory changes. Spleen: Normal in size without focal abnormality. Adrenals/Urinary Tract: Adrenal glands are unremarkable. Kidneys are normal, without renal calculi, focal lesion, or hydronephrosis. Bladder is unremarkable. Stomach/Bowel: Interval herniation of the cecum and distal ileum into the right inguinal hernia. Small volume of ascites within the hernia sac. Mild distention of the distal ileum with fecalization of stool indicating partial obstruction. Vascular/Lymphatic: Aortic atherosclerosis. No enlarged abdominal or pelvic lymph nodes. Reproductive: Status post hysterectomy. No adnexal masses. Other: Stable small paraumbilical hernia containing fat. Musculoskeletal: No fracture is seen. IMPRESSION: 1. Interval herniation of cecum and distal ileum into the right inguinal hernia sac. Small volume of ascites within the hernia sac. Mild upstream distention of distal ileum with fecalization indicating partial obstruction. 2. Stable small paraumbilical hernia containing fat and moderate hiatal hernia. Electronically Signed   By: Kristine Garbe M.D.   On: 07/16/2017 23:08    Procedures Procedures (including critical care time)  Medications Ordered in ED Medications  ondansetron (ZOFRAN) injection 4 mg (has no administration in time range)     Initial Impression / Assessment and Plan / ED Course  I have reviewed the triage vital signs and the nursing notes.  Pertinent labs & imaging results that were available during my care of the patient were reviewed by me and considered in my medical decision making (see chart for details).  58 year old female presenting to the ED with right lower abdominal pain.  Has known inguinal hernia and feels that there is a "tennis ball" stuck in her right lower abdomen.  She also reports some redness of her right groin.  She is afebrile and  nontoxic in appearance.  She does have firmness in the right lower abdomen consistent with hernia, this is not completely reducible on my exam.  Redness  of the groin appears to be a candidal rash in the skin fold which I discussed with patient.  Labs have been sent.  Will obtain CT scan to ensure no incarceration of her hernia.  CT scan revealing hernia now with herniation of the cecum and distal ileum into the hernia sac, was previously only containing fat.  There does appear to be some signs of obstruction.  Labs overall reassuring.  Will discuss with general surgery.  12:24 AM Discussed with on call general surgery, Dr. Kae Heller-- will come to ED and evaluate.  Patient and family updated.  Now some increased pain, meds ordered.  1:28 AM Hernia reduced by Dr. Windle Guard at bedside.  Will monitor here for a while longer, allow to eat/drink, ambulate around.  If hernia pops out again, will admit for observation.  2:51 AM Patient has been monitored here.  Has eaten meal, drank fluids, and walked laps around the ED.  She is feeling well at this time, hernia has remained reduced.  She appears stable for d/c.  Will have her follow-up in the surgery clinic on 7/17 as scheduled.  Will start nystatin for candidal rash.  Discussed plan with patient, she acknowledged understanding and agreed with plan of care.  Return precautions given for new or worsening symptoms.  Final Clinical Impressions(s) / ED Diagnoses   Final diagnoses:  Non-recurrent unilateral inguinal hernia with obstruction without gangrene  Candida rash of groin    ED Discharge Orders        Ordered    nystatin cream (MYCOSTATIN)     07/17/17 0254       Larene Pickett, PA-C 07/17/17 0420    Little, Wenda Overland, MD 07/18/17 1649

## 2017-07-16 NOTE — ED Notes (Signed)
ED Provider at bedside. 

## 2017-07-16 NOTE — ED Triage Notes (Signed)
Pt reports dx 2 hiatial hernias + 1 inguinal hernia. Today pt reports increasing pain, size, and redness to right lower abd that goes down to leg. Denies N/V/D, LBM today

## 2017-07-17 LAB — URINALYSIS, ROUTINE W REFLEX MICROSCOPIC
Bilirubin Urine: NEGATIVE
Glucose, UA: NEGATIVE mg/dL
HGB URINE DIPSTICK: NEGATIVE
Ketones, ur: NEGATIVE mg/dL
Leukocytes, UA: NEGATIVE
Nitrite: NEGATIVE
PH: 5 (ref 5.0–8.0)
Protein, ur: NEGATIVE mg/dL
SPECIFIC GRAVITY, URINE: 1.035 — AB (ref 1.005–1.030)

## 2017-07-17 MED ORDER — NYSTATIN 100000 UNIT/GM EX CREA: TOPICAL_CREAM | CUTANEOUS | 1 refills | Status: DC

## 2017-07-17 NOTE — Discharge Instructions (Signed)
Use the prescribed cream in your skin folds of the groin, this will help with the redness/pain. Follow-up with the general surgery clinic as scheduled. Return to the ED for new or worsening symptoms.

## 2017-07-17 NOTE — ED Notes (Signed)
Pt given water and crackers per provider, for PO challenge.

## 2017-07-17 NOTE — ED Notes (Signed)
Discharge instructions reviewed with pt. Pt verbalized understanding. Pt to follow up with surgery. PIV removed. Pr ambulatory to waiting room.

## 2017-07-17 NOTE — Consult Note (Signed)
Surgical Consultation Requesting provider: Quincy Carnes, PA-C  CC: right groin and leg pain  HPI: 57yo woman with history of endometrial cancer s/p robotic TAH BSO last year followed by chemo and local radiation, neuropathy from chemo, anxiety, and prior cholecystectomy presents with 1 day history of pain in the right groin/ lower abdomen extending into the leg. She has a known right inguinal hernia (as well as hiatal and umbilical hernias) based on a recent staging CT for hx endometrial cancer in mid-June. The hernia did not bother her aside from noting a mass in the right groin, and she has an appointment to see Dr. Lucia Gaskins on July 17 to address possible repair- he repaired her husband's inguinal hernia laparoscopically many years ago and they were very happy with that.  Today the right groin mass became larger and she experienced more pain localized to the area. Denies nausea or emesis, denies generalized abdominal pain. She had a bowel movement today which was normal, she does note decreased flatus. Notes increasing redness in the right groin as well. CT notes herniation of cecum and terminal ileum into the hernia.  No Known Allergies  Past Medical History:  Diagnosis Date  . Anxiety   . Endometrial cancer ([place])   . Family history of brain cancer   . Family history of breast cancer   . Family history of colon cancer   . Family history of colonic polyps   . Family history of prostate cancer   . History of chemotherapy   . History of radiation therapy 09/07/16-10/07/16   HDR to vaginal vault 30 gy in 5 fractions  . Hypertension     Past Surgical History:  Procedure Laterality Date  . CHOLECYSTECTOMY    . IR FLUORO GUIDE PORT INSERTION RIGHT  07/31/2016  . IR REMOVAL TUN ACCESS W/ PORT W/O FL MOD SED  01/13/2017  . IR US GUIDE VASC ACCESS RIGHT  07/31/2016  . LYMPH NODE BIOPSY N/A 06/25/2016   Procedure: LYMPH NODE BIOPSY;  Surgeon: Everitt Amber, MD;  Location: [facility];  Service: Gynecology;   Laterality: N/A;  . ROBOTIC ASSISTED TOTAL HYSTERECTOMY WITH BILATERAL SALPINGO OOPHERECTOMY Bilateral 06/25/2016   Procedure: XI ROBOTIC ASSISTED TOTAL HYSTERECTOMY WITH BILATERAL SALPINGO OOPHORECTOMY for uterus greater 250 grams ;  Surgeon: Everitt Amber, MD;  Location: [facility];  Service: Gynecology;  Laterality: Bilateral;  . TONSILLECTOMY      Family History  Problem Relation Age of Onset  . Breast cancer Sister 67  . Colon cancer Paternal Grandfather 68  . Breast cancer Sister 58       Negative genetic testing on a 46 gene panel through [facility]  . Thyroid cancer Sister 47  . Fibroids Mother   . Colon polyps Father   . Healthy Brother   . Breast cancer Maternal Aunt 86  . Prostate cancer Maternal Uncle 14  . Healthy Paternal Aunt   . Heart disease Maternal Grandmother   . Stroke Paternal Grandmother   . Colon polyps Sister   . Colon polyps Sister   . Healthy Sister   . Healthy Brother   . Brain cancer Maternal Aunt 55       died 3 days after diagnosis  . Breast cancer Maternal Aunt 80  . Healthy Paternal Aunt     Social History   Socioeconomic History  . Marital status: Married    Spouse name: [name]  . Number of children: 2  . Years of education: Not on file  . Highest  education level: Professional school degree (e.g., MD, DDS, DVM, JD)  Occupational History  . Occupation: retired  Scientific laboratory technician  . Financial resource strain: Not on file  . Food insecurity:    Worry: Not on file    Inability: Not on file  . Transportation needs:    Medical: Not on file    Non-medical: Not on file  Tobacco Use  . Smoking status: Never Smoker  . Smokeless tobacco: Never Used  Substance and Sexual Activity  . Alcohol use: No    Alcohol/week: 0.6 oz    Types: 1 Glasses of wine per week    Frequency: Never  . Drug use: No  . Sexual activity: Not on file  Lifestyle  . Physical activity:    Days per week: Not on file    Minutes per session: Not on file  . Stress: Not on file   Relationships  . Social connections:    Talks on phone: Not on file    Gets together: Not on file    Attends religious service: Not on file    Active member of club or organization: Not on file    Attends meetings of clubs or organizations: Not on file    Relationship status: Not on file  Other Topics Concern  . Not on file  Social History Narrative   Lives with husband in a 2 story home.  Has 2 sons.  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     No current facility-administered medications on file prior to encounter.    Current Outpatient Medications on File Prior to Encounter  Medication Sig Dispense Refill  . acetaminophen (TYLENOL) 500 MG tablet Take 1,000 mg by mouth every 6 (six) hours as needed for mild pain.     Marland Kitchen ALPRAZolam (XANAX) 0.5 MG tablet Take 0.25-0.5 mg by mouth daily as needed for anxiety (FOR PANIC/ANXIETY).     Marland Kitchen b complex vitamins tablet Take 1 tablet by mouth daily.    . calcium-vitamin D (OSCAL WITH D) 500-200 MG-UNIT tablet Take 1 tablet by mouth.    . citalopram (CELEXA) 40 MG tablet Take 40 mg daily by mouth.  1  . gabapentin (NEURONTIN) 300 MG capsule TAKE 3 CAPSULES (900 MG TOTAL) BY MOUTH 3 (THREE) TIMES DAILY. 810 capsule 0    Review of Systems: a complete, 10pt review of systems was completed with pertinent positives and negatives as documented in the HPI  Physical Exam: Vitals:   07/16/17 2355 07/16/17 2357  BP: 129/85   Pulse: 65 (!) 57  Resp: 14 13  Temp:    SpO2:  94%   Gen: alert and cooperative, anxious but no acute distress Head: normocephalic, atraumatic, hirsute Eyes: extraocular motions intact, anicteric.  Neck: supple without mass or thyromegaly Chest: unlabored respirations, symmetrical air entry, clear bilaterally   Cardiovascular: RRR with palpable distal pulses, no pedal edema Abdomen: soft, nondistended, obese, nontender. There is a vague mass with bowel sounds consistent with right inguinal  hernia containing bowel as noted on CT scan. This is easily reduced with gentle pressure for about 30 seconds. She notes immediate improvement in pain. Due to her habitus the inguinal ring etc are not palpable. The redness she has described is local dermatitis/ panniculitis, candidal odor Extremities: warm, without edema, no deformities  Neuro: grossly intact Psych: appropriate mood and anxious affect, normal insight  Skin: warm and dry   CBC Latest Ref Rng & Units 07/16/2017 01/13/2017 11/18/2016  WBC 4.0 - 10.5 K/uL 11.0(H) 6.4 6.2  Hemoglobin 12.0 - 15.0 g/dL 15.7(H) 12.9 11.5(L)  Hematocrit 36.0 - 46.0 % 47.9(H) 39.2 36.2  Platelets 150 - 400 K/uL 260 230 156    CMP Latest Ref Rng & Units 07/16/2017 06/21/2017 11/18/2016  Glucose 70 - 99 mg/dL 129(H) 86 82  BUN 6 - 20 mg/dL 18 18 18.6  Creatinine 0.44 - 1.00 mg/dL 0.74 0.75 0.7  Sodium 135 - 145 mmol/L 140 141 140  Potassium 3.5 - 5.1 mmol/L 4.3 4.3 4.7  Chloride 98 - 111 mmol/L 106 107 -  CO2 22 - 32 mmol/L 21(L) 24 27  Calcium 8.9 - 10.3 mg/dL 9.6 9.9 9.2  Total Protein 6.5 - 8.1 g/dL 7.1 - 7.0  Total Bilirubin 0.3 - 1.2 mg/dL 0.5 - 0.33  Alkaline Phos 38 - 126 U/L 57 - 59  AST 15 - 41 U/L 17 - 12  ALT 0 - 44 U/L 15 - 12    Lab Results  Component Value Date   INR 1.03 01/13/2017   INR 1.00 07/31/2016    Imaging: Ct Abdomen Pelvis W Contrast  Result Date: 07/16/2017 CLINICAL DATA:  57 y/o F; history of 2 hiatal hernias and 1 inguinal hernia. Increasing pain, size, and redness to the right lower abdomen going down the leg. History of endometrial cancer post radiation therapy. EXAM: CT ABDOMEN AND PELVIS WITH CONTRAST TECHNIQUE: Multidetector CT imaging of the abdomen and pelvis was performed using the standard protocol following bolus administration of intravenous contrast. CONTRAST:  100 cc Isovue-300 COMPARISON:  06/24/2017 CT abdomen and pelvis. FINDINGS: Lower chest: Stable moderate hiatal hernia. Hepatobiliary: No focal liver  abnormality is seen. Status post cholecystectomy. No biliary dilatation. Pancreas: Unremarkable. No pancreatic ductal dilatation or surrounding inflammatory changes. Spleen: Normal in size without focal abnormality. Adrenals/Urinary Tract: Adrenal glands are unremarkable. Kidneys are normal, without renal calculi, focal lesion, or hydronephrosis. Bladder is unremarkable. Stomach/Bowel: Interval herniation of the cecum and distal ileum into the right inguinal hernia. Small volume of ascites within the hernia sac. Mild distention of the distal ileum with fecalization of stool indicating partial obstruction. Vascular/Lymphatic: Aortic atherosclerosis. No enlarged abdominal or pelvic lymph nodes. Reproductive: Status post hysterectomy. No adnexal masses. Other: Stable small paraumbilical hernia containing fat. Musculoskeletal: No fracture is seen. IMPRESSION: 1. Interval herniation of cecum and distal ileum into the right inguinal hernia sac. Small volume of ascites within the hernia sac. Mild upstream distention of distal ileum with fecalization indicating partial obstruction. 2. Stable small paraumbilical hernia containing fat and moderate hiatal hernia. Electronically Signed   By: Kristine Garbe M.D.   On: 07/16/2017 23:08    A/P: 57yo woman with now reduced right inguinal hernia. OK for discharge home and keep scheduled outpatient consultation. I discussed with her possibility that bowel re-herniates and how she can attempt reduction- supine, relax, gentle constant pressure etc. Discussed if incarcerates may require urgent surgical intervention. Questions welcomed and answered.    Romana Juniper, MD [facility] Surgery, [place] Pager [phone]

## 2017-07-28 ENCOUNTER — Ambulatory Visit: Payer: Self-pay | Admitting: Surgery

## 2017-08-04 ENCOUNTER — Other Ambulatory Visit: Payer: Self-pay | Admitting: Surgery

## 2017-08-04 DIAGNOSIS — Z1231 Encounter for screening mammogram for malignant neoplasm of breast: Secondary | ICD-10-CM

## 2017-08-06 ENCOUNTER — Ambulatory Visit: Payer: Self-pay | Admitting: Surgery

## 2017-08-10 ENCOUNTER — Encounter (HOSPITAL_COMMUNITY): Payer: Self-pay

## 2017-08-10 NOTE — Patient Instructions (Signed)
Your procedure is scheduled on: Friday, Aug. 9, 2019   Surgery Time:  9:30AM-12:30PM   Report to [place]  Entrance    Report to admitting at 7:30 AM   Call this number if you have problems the morning of surgery [phone]   Do not eat food or drink liquids :After Midnight.   Do NOT smoke after Midnight   Complete one Ensure drink the morning of surgery by 6:30AM the day of surgery.   Take these medicines the morning of surgery with A SIP OF WATER: Citalopram, Gabapentin, Xanax if needed                               You may not have any metal on your body including hair pins, jewelry, and body piercings             Do not wear make-up, lotions, powders, perfumes/cologne, or deodorant             Do not wear nail polish.  Do not shave  48 hours prior to surgery.                Do not bring valuables to the hospital. [place].   Contacts, dentures or bridgework may not be worn into surgery.   Leave suitcase in the car. After surgery it may be brought to your room.   Special Instructions: Bring a copy of your healthcare power of attorney and living will documents         the day of surgery if you haven't scanned them in before.              Please read over the following fact sheets you were given:  [facility] - Preparing for Surgery Before surgery, you can play an important role.  Because skin is not sterile, your skin needs to be as free of germs as possible.  You can reduce the number of germs on your skin by washing with CHG (chlorahexidine gluconate) soap before surgery.  CHG is an antiseptic cleaner which kills germs and bonds with the skin to continue killing germs even after washing. Please DO NOT use if you have an allergy to CHG or antibacterial soaps.  If your skin becomes reddened/irritated stop using the CHG and inform your nurse when you arrive at Short Stay. Do not shave (including legs and  underarms) for at least 48 hours prior to the first CHG shower.  You may shave your face/neck.  Please follow these instructions carefully:  1.  Shower with CHG Soap the night before surgery and the  morning of surgery.  2.  If you choose to wash your hair, wash your hair first as usual with your normal  shampoo.  3.  After you shampoo, rinse your hair and body thoroughly to remove the shampoo.                             4.  Use CHG as you would any other liquid soap.  You can apply chg directly to the skin and wash.  Gently with a scrungie or clean washcloth.  5.  Apply the CHG Soap to your body ONLY FROM THE NECK DOWN.   Do   not use on  face/ open                           Wound or open sores. Avoid contact with eyes, ears mouth and   genitals (private parts).                       Wash face,  Genitals (private parts) with your normal soap.             6.  Wash thoroughly, paying special attention to the area where your    surgery  will be performed.  7.  Thoroughly rinse your body with warm water from the neck down.  8.  DO NOT shower/wash with your normal soap after using and rinsing off the CHG Soap.                9.  Pat yourself dry with a clean towel.            10.  Wear clean pajamas.            11.  Place clean sheets on your bed the night of your first shower and do not  sleep with pets. Day of Surgery : Do not apply any lotions/deodorants the morning of surgery.  Please wear clean clothes to the hospital/surgery center.  FAILURE TO FOLLOW THESE INSTRUCTIONS MAY RESULT IN THE CANCELLATION OF YOUR SURGERY  PATIENT SIGNATURE_________________________________  NURSE SIGNATURE__________________________________  ________________________________________________________________________

## 2017-08-17 ENCOUNTER — Encounter (HOSPITAL_COMMUNITY)
Admission: RE | Admit: 2017-08-17 | Discharge: 2017-08-17 | Disposition: A | Discharge status: Home / Self Care | Payer: Managed Care, Other (non HMO) | Source: Ambulatory Visit | Attending: Surgery | Admitting: Surgery

## 2017-08-17 ENCOUNTER — Encounter (HOSPITAL_COMMUNITY): Payer: Self-pay

## 2017-08-17 ENCOUNTER — Other Ambulatory Visit: Payer: Self-pay

## 2017-08-17 DIAGNOSIS — Z6841 Body Mass Index (BMI) 40.0 and over, adult: Secondary | ICD-10-CM | POA: Diagnosis not present

## 2017-08-17 DIAGNOSIS — K429 Umbilical hernia without obstruction or gangrene: Secondary | ICD-10-CM | POA: Diagnosis not present

## 2017-08-17 DIAGNOSIS — K4030 Unilateral inguinal hernia, with obstruction, without gangrene, not specified as recurrent: Secondary | ICD-10-CM | POA: Diagnosis not present

## 2017-08-17 DIAGNOSIS — I10 Essential (primary) hypertension: Secondary | ICD-10-CM | POA: Diagnosis not present

## 2017-08-17 DIAGNOSIS — I739 Peripheral vascular disease, unspecified: Secondary | ICD-10-CM | POA: Diagnosis not present

## 2017-08-17 DIAGNOSIS — F419 Anxiety disorder, unspecified: Secondary | ICD-10-CM | POA: Diagnosis not present

## 2017-08-17 DIAGNOSIS — Z79899 Other long term (current) drug therapy: Secondary | ICD-10-CM | POA: Diagnosis not present

## 2017-08-17 HISTORY — DX: Anemia, unspecified: D64.9

## 2017-08-17 HISTORY — DX: Umbilical hernia without obstruction or gangrene: K42.9

## 2017-08-17 HISTORY — DX: Polyneuropathy, unspecified: G62.9

## 2017-08-17 HISTORY — DX: Other fracture of unspecified lower leg, initial encounter for closed fracture: S82.899A

## 2017-08-17 HISTORY — DX: Personal history of other diseases of the digestive system: Z87.19

## 2017-08-17 HISTORY — DX: Unilateral inguinal hernia, without obstruction or gangrene, not specified as recurrent: K40.90

## 2017-08-17 HISTORY — DX: Cardiomegaly: I51.7

## 2017-08-17 HISTORY — DX: Atherosclerosis of aorta: I70.0

## 2017-08-17 LAB — CBC
HCT: 44.2 % (ref 36.0–46.0)
HEMOGLOBIN: 14.4 g/dL (ref 12.0–15.0)
MCH: 32.1 pg (ref 26.0–34.0)
MCHC: 32.6 g/dL (ref 30.0–36.0)
MCV: 98.4 fL (ref 78.0–100.0)
Platelets: 254 10*3/uL (ref 150–400)
RBC: 4.49 MIL/uL (ref 3.87–5.11)
RDW: 14.2 % (ref 11.5–15.5)
WBC: 7.6 10*3/uL (ref 4.0–10.5)

## 2017-08-19 MED ORDER — BUPIVACAINE LIPOSOME 1.3 % IJ SUSP
20.0000 mL | INTRAMUSCULAR | Status: DC
  Filled 2017-08-19: qty 20

## 2017-08-19 NOTE — Anesthesia Preprocedure Evaluation (Addendum)
Anesthesia Evaluation  Patient identified by MRN, date of birth, ID band Patient awake    Reviewed: Allergy & Precautions, NPO status , Patient's Chart, lab work & pertinent test results  Airway Mallampati: II  TM Distance: >3 FB Neck ROM: Full    Dental no notable dental hx. (+) Teeth Intact, Dental Advisory Given   Pulmonary neg pulmonary ROS,    Pulmonary exam normal breath sounds clear to auscultation       Cardiovascular Exercise Tolerance: Good hypertension, + Peripheral Vascular Disease  Normal cardiovascular exam Rhythm:Regular Rate:Normal     Neuro/Psych Anxiety  Neuromuscular disease negative neurological ROS     GI/Hepatic Neg liver ROS, hiatal hernia,   Endo/Other  Morbid obesity  Renal/GU negative Renal ROS     Musculoskeletal   Abdominal (+) + obese,   Peds  Hematology  (+) anemia ,   Anesthesia Other Findings   Reproductive/Obstetrics                           Anesthesia Physical Anesthesia Plan  ASA: III  Anesthesia Plan: General   Post-op Pain Management:    Induction: Intravenous  PONV Risk Score and Plan: 0 and 1 and Treatment may vary due to age or medical condition, Ondansetron, Dexamethasone and Scopolamine patch - Pre-op  Airway Management Planned: Oral ETT  Additional Equipment:   Intra-op Plan:   Post-operative Plan: Extubation in OR  Informed Consent: I have reviewed the patients History and Physical, chart, labs and discussed the procedure including the risks, benefits and alternatives for the proposed anesthesia with the patient or authorized representative who has indicated his/her understanding and acceptance.   Dental advisory given  Plan Discussed with:   Anesthesia Plan Comments:        Anesthesia Quick Evaluation

## 2017-08-19 NOTE — H&P (Signed)
Misty [name]  Location: [facility] Patient #: [id number] DOB: Sep 06, 1960 Married / Language: English / Race: White Female  History of Present Illness   The patient is a 57 year old female who presents with a complaint of hernia.  The PCP is Dr. Denman George (she has no regular PCP)  The patient was referred by self  She comes with her husband  She was seen by Dr. Kae Heller on 07/17/2017 in consultation. 57 yo woman with history of endometrial cancer s/p robotic TAH BSO last year followed by chemo and local radiation, neuropathy from chemo, anxiety, and prior cholecystectomy presents with 1 day history of pain in the right groin/ lower abdomen extending into the leg. She has a known right inguinal hernia (as well as hiatal and umbilical hernias) based on a recent staging CT for hx endometrial cancer in mid-June. The hernia did not bother her aside from noting a mass in the right groin, and she has an appointment to see Dr. Lucia Gaskins on July 17 to address possible repair- he repaired her husband's inguinal hernia laparoscopically many years ago and they were very happy with that. On 7/6/'2019 the right groin mass became larger and she experienced more pain localized to the area. Denies nausea or emesis, denies generalized abdominal pain. She had a bowel movement today which was normal, she does note decreased flatus. Notes increasing redness in the right groin as well. CT notes herniation of cecum and terminal ileum into the hernia. On 07/16/2017 - CT scan of abdomen - 1. Interval herniation of cecum and distal ileum into the right inguinal hernia sac. Small volume of ascites within the hernia sac. Mild upstream distention of distal ileum with fecalization indicating partial obstruction. 2. Stable small paraumbilical hernia containing fat and moderate hiatal hernia.  I discussed the indications and complications of hernia surgery with the patient.  I discussed both the laparoscopic and open approach to hernia repair.. The potential risks of hernia surgery include, but are not limited to, bleeding, infection, open surgery, nerve injury, and recurrence of the hernia. I provided the patient literature about hernia surgery.  Plan: 1) Open repair of right inguinal hernia (possible laparoscopy)  Past Medical History: 1. Endometrial cancer TAH/BSO - 06/25/2016 - [facility] - Dr. Natale Lay 2. Bilateral hand and feet parathesias - secondary to taxol and caroplatin she does not drive because of the neuropathy - it affects both her hands and feet Followed by Dr. Narda Amber 3. History of lap chole 4. Significant HH 5. Umbilical hernia 6. No recent mammograms/never had colonoscopy 7. 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 Morbid obesty - BMI - 41  Social History: Married I did her husband, Natania Finigan, right inguinal hernia on 12/01/2012 at [facility]. Not working   Past Surgical History Sabino Gasser; 07/28/2017 9:35 AM) Gallbladder Surgery - Laparoscopic  Hysterectomy (due to cancer) - Complete  Tonsillectomy   Diagnostic Studies History Sabino Gasser; 07/28/2017 9:35 AM) Colonoscopy  never Mammogram  >3 years ago Pap Smear  1-5 years ago  Allergies Sabino Gasser; 07/28/2017 9:36 AM) No Known Drug Allergies [07/28/2017]: Allergies Reconciled   Medication History Sabino Gasser; 07/28/2017 9:36 AM) Citalopram Hydrobromide (40MG  Tablet, Oral) Active. Gabapentin (300MG  Capsule, Oral) Active. Nystatin (100000 UNIT/GM Cream, External) Active. Medications Reconciled  Social History Sabino Gasser; 07/28/2017 9:35 AM) Alcohol use  Recently quit alcohol use. Caffeine use  Coffee, Tea. No drug use  Tobacco use  Never smoker.  Family History Sabino Gasser; 07/28/2017 9:35 AM) Alcohol Abuse  Father. Breast Cancer   Sister. Cancer  Sister. Cerebrovascular Accident  Father. Colon Polyps  Brother, Father, Sister. Depression  Family Members In [place], Son. Diabetes Mellitus  Sister. Heart Disease  Father. Hypertension  Brother, Mother, Sister. Thyroid problems  Sister.  Pregnancy / Birth History Sabino Gasser; 07/28/2017 9:35 AM) Age at menarche  46 years. Age of menopause  45-60 Gravida  2 Irregular periods  Length (months) of breastfeeding  7-12 Maternal age  32-30 Para  2  Other Problems Sabino Gasser; 07/28/2017 9:35 AM) Anxiety Disorder  Back Pain  Cervical Cancer  Cholelithiasis  Inguinal Hernia  Oophorectomy  Bilateral. Umbilical Hernia Repair     Review of Systems Sabino Gasser; 07/28/2017 9:35 AM) General Not Present- Appetite Loss, Chills, Fatigue, Fever, Night Sweats, Weight Gain and Weight Loss. Skin Not Present- Change in Wart/Mole, Dryness, Hives, Jaundice, New Lesions, Non-Healing Wounds, Rash and Ulcer. HEENT Present- Wears glasses/contact lenses. Not Present- Earache, Hearing Loss, Hoarseness, Nose Bleed, Oral Ulcers, Ringing in the Ears, Seasonal Allergies, Sinus Pain, Sore Throat, Visual Disturbances and Yellow Eyes. Respiratory Present- Snoring. Not Present- Bloody sputum, Chronic Cough, Difficulty Breathing and Wheezing. Breast Not Present- Breast Mass, Breast Pain, Nipple Discharge and Skin Changes. Cardiovascular Present- Swelling of Extremities. Not Present- Chest Pain, Difficulty Breathing Lying Down, Leg Cramps, Palpitations, Rapid Heart Rate and Shortness of Breath. Gastrointestinal Present- Abdominal Pain, Bloating, Change in Bowel Habits, Constipation and Excessive gas. Not Present- Bloody Stool, Chronic diarrhea, Difficulty Swallowing, Gets full quickly at meals, Hemorrhoids, Indigestion, Nausea, Rectal Pain and Vomiting. Female Genitourinary Not Present- Frequency, Nocturia, Painful Urination, Pelvic Pain and Urgency. Musculoskeletal  Present- Back Pain. Not Present- Joint Pain, Joint Stiffness, Muscle Pain, Muscle Weakness and Swelling of Extremities. Neurological Present- Numbness, Tingling and Trouble walking. Not Present- Decreased Memory, Fainting, Headaches, Seizures, Tremor and Weakness. Psychiatric Present- Anxiety. Not Present- Bipolar, Change in Sleep Pattern, Depression, Fearful and Frequent crying. Endocrine Present- Cold Intolerance and Hair Changes. Not Present- Excessive Hunger, Heat Intolerance, Hot flashes and New Diabetes. Hematology Not Present- Blood Thinners, Easy Bruising, Excessive bleeding, Gland problems, HIV and Persistent Infections.  Vitals Sabino Gasser; 07/28/2017 9:38 AM) 07/28/2017 9:36 AM Weight: 236.13 lb Height: 63in Body Surface Area: 2.08 m Body Mass Index: 41.83 kg/m  Temp.: 98.8F(Oral)  Pulse: 102 (Regular)  BP: 142/90 (Sitting, Left Arm, Standard)   Physical Exam  General: Obese WFalert and generally healthy appearing. Skin: Inspection and palpation of the skin unremarkable.  Eyes: Conjunctivae white, pupils equal. Face, ears, nose, mouth, and throat: Face - normal. Normal ears and nose. Lips and teeth normal.  Neck: Supple. No mass. Trachea midline. No thyroid mass.  Lymph Nodes: No supraclavicular or cervical adenopathy. No axillary adenopathy.  Lungs: Normal respiratory effort. Clear to auscultation and symmetric breath sounds. Cardiovascular: Regular rate and rythm. Normal auscultation of the heart. No murmur or rub.  Abdomen: Soft. No mass. Liver and spleen not palpable. No tenderness. No hernia. Normal bowel sounds.  Scar at umbilicus Has large soft right inguinal hernia. She is obese, so it is somewhat hard to feel. It is not completely reducible. There is not tenderness.  Rectal: Not done.  Musculoskeletal/extremities: she moves slowly - because of neuropathy Good strength and ROM in upper and lower extremities.    Neurologic: neuropathy of hands and feet  Psychiatric: Has normal mood and affect. Judgement and insight appear normal.   Assessment & Plan  1.  INGUINAL HERNIA OF RIGHT SIDE WITHOUT OBSTRUCTION OR GANGRENE (K40.90)  Plan:   1) Open right inguinal hernia repair, possible laparoscopy   2) Update mammograms  2.  Umbilical hernia  I talked to the patient about fixing the umbilical hernia at the same time. I talked about the risk - but I may need to laparoscope her anyway to make sure that I have all the colon out of my field. I had planned to keep her overnight because of the bowel in the hernia. this would add to the reason that I would keep her in the hospital. So we will add possible repair of umbilical hernia to the consent.  3.  NEUROPATHY (G62.9)  Bilateral hand and feet parathesias - secondary to taxol and caroplatin she does not drive because of the neuropathy - it affects both her hands and feet Followed by Dr. Narda Amber 4.  ENDOMETRIAL CANCER (C54.1) TAH/BSO - 06/25/2016 - [facility] - Dr. Natale Lay 5. Significant HH 6. No recent mammograms/never had colonoscopy 7. 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 Morbid obesty - BMI - 41   Alphonsa Overall, MD, [facility] Pager: [phone] Office phone:  [phone]

## 2017-08-20 ENCOUNTER — Observation Stay (HOSPITAL_COMMUNITY)
Admission: RE | Admit: 2017-08-20 | Discharge: 2017-08-22 | Disposition: A | Discharge status: Home / Self Care | Payer: Managed Care, Other (non HMO) | Source: Ambulatory Visit | Attending: Surgery | Admitting: Surgery

## 2017-08-20 ENCOUNTER — Encounter (HOSPITAL_COMMUNITY)
Admission: RE | Disposition: A | Discharge status: Home / Self Care | Payer: Self-pay | Source: Ambulatory Visit | Attending: Surgery

## 2017-08-20 ENCOUNTER — Ambulatory Visit (HOSPITAL_COMMUNITY): Payer: Managed Care, Other (non HMO) | Admitting: Anesthesiology

## 2017-08-20 ENCOUNTER — Encounter (HOSPITAL_COMMUNITY): Payer: Self-pay | Admitting: Emergency Medicine

## 2017-08-20 ENCOUNTER — Other Ambulatory Visit: Payer: Self-pay

## 2017-08-20 ENCOUNTER — Encounter: Payer: Self-pay | Admitting: *Deleted

## 2017-08-20 DIAGNOSIS — K4030 Unilateral inguinal hernia, with obstruction, without gangrene, not specified as recurrent: Principal | ICD-10-CM | POA: Diagnosis present

## 2017-08-20 DIAGNOSIS — Z79899 Other long term (current) drug therapy: Secondary | ICD-10-CM | POA: Insufficient documentation

## 2017-08-20 DIAGNOSIS — K429 Umbilical hernia without obstruction or gangrene: Secondary | ICD-10-CM | POA: Insufficient documentation

## 2017-08-20 DIAGNOSIS — F419 Anxiety disorder, unspecified: Secondary | ICD-10-CM | POA: Insufficient documentation

## 2017-08-20 DIAGNOSIS — I10 Essential (primary) hypertension: Secondary | ICD-10-CM | POA: Insufficient documentation

## 2017-08-20 DIAGNOSIS — Z6841 Body Mass Index (BMI) 40.0 and over, adult: Secondary | ICD-10-CM | POA: Insufficient documentation

## 2017-08-20 DIAGNOSIS — I739 Peripheral vascular disease, unspecified: Secondary | ICD-10-CM | POA: Insufficient documentation

## 2017-08-20 HISTORY — PX: LAPAROSCOPY: SHX197

## 2017-08-20 HISTORY — PX: INGUINAL HERNIA REPAIR: SHX194

## 2017-08-20 HISTORY — PX: UMBILICAL HERNIA REPAIR: SHX196

## 2017-08-20 HISTORY — PX: INSERTION OF MESH: SHX5868

## 2017-08-20 SURGERY — REPAIR, HERNIA, INGUINAL, ADULT
Anesthesia: General | Site: Abdomen | Laterality: Right

## 2017-08-20 MED ORDER — MIDAZOLAM HCL 2 MG/2ML IJ SOLN
2.0000 mg | Freq: Once | INTRAMUSCULAR | Status: DC
  Filled 2017-08-20: qty 2

## 2017-08-20 MED ORDER — KETAMINE HCL 10 MG/ML IJ SOLN
INTRAMUSCULAR | Status: DC | PRN
  Administered 2017-08-20: 50 mg via INTRAVENOUS

## 2017-08-20 MED ORDER — FENTANYL CITRATE (PF) 250 MCG/5ML IJ SOLN
INTRAMUSCULAR | Status: DC | PRN
  Administered 2017-08-20: 100 ug via INTRAVENOUS
  Administered 2017-08-20 (×3): 50 ug via INTRAVENOUS

## 2017-08-20 MED ORDER — PROPOFOL 10 MG/ML IV BOLUS
INTRAVENOUS | Status: DC | PRN
  Administered 2017-08-20: 140 mg via INTRAVENOUS

## 2017-08-20 MED ORDER — GABAPENTIN 300 MG PO CAPS
300.0000 mg | ORAL_CAPSULE | ORAL | Status: AC
  Administered 2017-08-20: 300 mg via ORAL
  Filled 2017-08-20: qty 1

## 2017-08-20 MED ORDER — LIDOCAINE 2% (20 MG/ML) 5 ML SYRINGE
INTRAMUSCULAR | Status: DC | PRN
  Administered 2017-08-20: 1.5 mg/kg/h via INTRAVENOUS

## 2017-08-20 MED ORDER — ROCURONIUM BROMIDE 10 MG/ML (PF) SYRINGE
PREFILLED_SYRINGE | INTRAVENOUS | Status: AC
  Filled 2017-08-20: qty 10

## 2017-08-20 MED ORDER — HEPARIN SODIUM (PORCINE) 5000 UNIT/ML IJ SOLN
5000.0000 [IU] | Freq: Three times a day (TID) | INTRAMUSCULAR | Status: DC
  Administered 2017-08-20 – 2017-08-22 (×5): 5000 [IU] via SUBCUTANEOUS
  Filled 2017-08-20 (×6): qty 1

## 2017-08-20 MED ORDER — ACETAMINOPHEN 500 MG PO TABS: 1000.0000 mg | ORAL_TABLET | Freq: Four times a day (QID) | ORAL | Status: DC | PRN

## 2017-08-20 MED ORDER — KETAMINE HCL 10 MG/ML IJ SOLN
INTRAMUSCULAR | Status: AC
  Filled 2017-08-20: qty 1

## 2017-08-20 MED ORDER — PROMETHAZINE HCL 25 MG/ML IJ SOLN: 6.2500 mg | INTRAMUSCULAR | Status: DC | PRN

## 2017-08-20 MED ORDER — CHLORHEXIDINE GLUCONATE CLOTH 2 % EX PADS: 6.0000 | MEDICATED_PAD | Freq: Once | CUTANEOUS | Status: DC

## 2017-08-20 MED ORDER — DEXAMETHASONE SODIUM PHOSPHATE 10 MG/ML IJ SOLN
INTRAMUSCULAR | Status: AC
  Filled 2017-08-20: qty 1

## 2017-08-20 MED ORDER — ONDANSETRON 4 MG PO TBDP: 4.0000 mg | ORAL_TABLET | Freq: Four times a day (QID) | ORAL | Status: DC | PRN

## 2017-08-20 MED ORDER — EPHEDRINE SULFATE-NACL 50-0.9 MG/10ML-% IV SOSY
PREFILLED_SYRINGE | INTRAVENOUS | Status: DC | PRN
  Administered 2017-08-20: 10 mg via INTRAVENOUS
  Administered 2017-08-20: 5 mg via INTRAVENOUS
  Administered 2017-08-20: 10 mg via INTRAVENOUS

## 2017-08-20 MED ORDER — MIDAZOLAM HCL 2 MG/2ML IJ SOLN
INTRAMUSCULAR | Status: DC | PRN
  Administered 2017-08-20: 2 mg via INTRAVENOUS

## 2017-08-20 MED ORDER — ONDANSETRON HCL 4 MG/2ML IJ SOLN
INTRAMUSCULAR | Status: AC
  Filled 2017-08-20: qty 2

## 2017-08-20 MED ORDER — ACETAMINOPHEN 10 MG/ML IV SOLN: 1000.0000 mg | Freq: Once | INTRAVENOUS | Status: DC | PRN

## 2017-08-20 MED ORDER — HYDROMORPHONE HCL 1 MG/ML IJ SOLN
0.2500 mg | INTRAMUSCULAR | Status: DC | PRN
  Administered 2017-08-20: 0.25 mg via INTRAVENOUS
  Administered 2017-08-20 (×2): 0.5 mg via INTRAVENOUS
  Administered 2017-08-20: 0.25 mg via INTRAVENOUS
  Administered 2017-08-20: 0.5 mg via INTRAVENOUS

## 2017-08-20 MED ORDER — MEPERIDINE HCL 50 MG/ML IJ SOLN
6.2500 mg | INTRAMUSCULAR | Status: DC | PRN
Start: 2017-08-20 — End: 2017-08-20

## 2017-08-20 MED ORDER — ALPRAZOLAM 0.25 MG PO TABS: 0.2500 mg | ORAL_TABLET | Freq: Every day | ORAL | Status: DC | PRN

## 2017-08-20 MED ORDER — FENTANYL CITRATE (PF) 100 MCG/2ML IJ SOLN
100.0000 ug | Freq: Once | INTRAMUSCULAR | Status: DC
  Filled 2017-08-20: qty 2

## 2017-08-20 MED ORDER — TRAMADOL HCL 50 MG PO TABS
50.0000 mg | ORAL_TABLET | Freq: Four times a day (QID) | ORAL | Status: DC | PRN
  Administered 2017-08-20: 50 mg via ORAL
  Filled 2017-08-20: qty 1

## 2017-08-20 MED ORDER — BUPIVACAINE-EPINEPHRINE (PF) 0.25% -1:200000 IJ SOLN
INTRAMUSCULAR | Status: DC | PRN
  Administered 2017-08-20: 30 mL

## 2017-08-20 MED ORDER — MIDAZOLAM HCL 2 MG/2ML IJ SOLN
INTRAMUSCULAR | Status: AC
  Filled 2017-08-20: qty 2

## 2017-08-20 MED ORDER — ROCURONIUM BROMIDE 10 MG/ML (PF) SYRINGE
PREFILLED_SYRINGE | INTRAVENOUS | Status: DC | PRN
  Administered 2017-08-20 (×3): 10 mg via INTRAVENOUS
  Administered 2017-08-20: 50 mg via INTRAVENOUS
  Administered 2017-08-20 (×2): 10 mg via INTRAVENOUS

## 2017-08-20 MED ORDER — FENTANYL CITRATE (PF) 250 MCG/5ML IJ SOLN
INTRAMUSCULAR | Status: AC
Start: 2017-08-20 — End: ?
  Filled 2017-08-20: qty 5

## 2017-08-20 MED ORDER — SUGAMMADEX SODIUM 200 MG/2ML IV SOLN
INTRAVENOUS | Status: DC | PRN
  Administered 2017-08-20: 250 mg via INTRAVENOUS

## 2017-08-20 MED ORDER — CITALOPRAM HYDROBROMIDE 20 MG PO TABS
40.0000 mg | ORAL_TABLET | Freq: Every day | ORAL | Status: DC
  Administered 2017-08-21 – 2017-08-22 (×2): 40 mg via ORAL
  Filled 2017-08-20 (×2): qty 2

## 2017-08-20 MED ORDER — ONDANSETRON HCL 4 MG/2ML IJ SOLN: 4.0000 mg | Freq: Four times a day (QID) | INTRAMUSCULAR | Status: DC | PRN

## 2017-08-20 MED ORDER — BUPIVACAINE LIPOSOME 1.3 % IJ SUSP
INTRAMUSCULAR | Status: DC | PRN
  Administered 2017-08-20: 20 mL

## 2017-08-20 MED ORDER — BUPIVACAINE-EPINEPHRINE (PF) 0.25% -1:200000 IJ SOLN
INTRAMUSCULAR | Status: AC
  Filled 2017-08-20: qty 30

## 2017-08-20 MED ORDER — CEFAZOLIN SODIUM-DEXTROSE 2-4 GM/100ML-% IV SOLN
2.0000 g | INTRAVENOUS | Status: AC
  Administered 2017-08-20: 2 g via INTRAVENOUS
  Filled 2017-08-20: qty 100

## 2017-08-20 MED ORDER — 0.9 % SODIUM CHLORIDE (POUR BTL) OPTIME
TOPICAL | Status: DC | PRN
  Administered 2017-08-20: 2000 mL

## 2017-08-20 MED ORDER — CLONIDINE HCL 0.2 MG PO TABS
0.2000 mg | ORAL_TABLET | Freq: Once | ORAL | Status: AC
  Administered 2017-08-20: 0.2 mg via ORAL
  Filled 2017-08-20: qty 1

## 2017-08-20 MED ORDER — LIDOCAINE 2% (20 MG/ML) 5 ML SYRINGE
INTRAMUSCULAR | Status: AC
  Filled 2017-08-20: qty 5

## 2017-08-20 MED ORDER — HYDROCODONE-ACETAMINOPHEN 5-325 MG PO TABS
1.0000 | ORAL_TABLET | ORAL | Status: DC | PRN
  Administered 2017-08-20: 2 via ORAL
  Administered 2017-08-20: 1 via ORAL
  Administered 2017-08-21 – 2017-08-22 (×6): 2 via ORAL
  Filled 2017-08-20: qty 2
  Filled 2017-08-20: qty 1
  Filled 2017-08-20 (×6): qty 2

## 2017-08-20 MED ORDER — DEXAMETHASONE SODIUM PHOSPHATE 10 MG/ML IJ SOLN
INTRAMUSCULAR | Status: DC | PRN
  Administered 2017-08-20: 10 mg via INTRAVENOUS

## 2017-08-20 MED ORDER — LACTATED RINGERS IV SOLN
INTRAVENOUS | Status: DC
  Administered 2017-08-20 (×2): via INTRAVENOUS

## 2017-08-20 MED ORDER — POTASSIUM CHLORIDE IN NACL 20-0.45 MEQ/L-% IV SOLN
INTRAVENOUS | Status: DC
  Administered 2017-08-20 – 2017-08-21 (×2): via INTRAVENOUS
  Filled 2017-08-20 (×3): qty 1000

## 2017-08-20 MED ORDER — LIDOCAINE 2% (20 MG/ML) 5 ML SYRINGE
INTRAMUSCULAR | Status: DC | PRN
  Administered 2017-08-20: 100 mg via INTRAVENOUS

## 2017-08-20 MED ORDER — PROPOFOL 10 MG/ML IV BOLUS
INTRAVENOUS | Status: AC
  Filled 2017-08-20: qty 20

## 2017-08-20 MED ORDER — HYDROCODONE-ACETAMINOPHEN 7.5-325 MG PO TABS: 1.0000 | ORAL_TABLET | Freq: Once | ORAL | Status: DC | PRN

## 2017-08-20 MED ORDER — HYDROMORPHONE HCL 1 MG/ML IJ SOLN
INTRAMUSCULAR | Status: AC
  Filled 2017-08-20: qty 1

## 2017-08-20 MED ORDER — LIDOCAINE HCL 2 % IJ SOLN
INTRAMUSCULAR | Status: AC
  Filled 2017-08-20: qty 20

## 2017-08-20 MED ORDER — MORPHINE SULFATE (PF) 2 MG/ML IV SOLN
1.0000 mg | INTRAVENOUS | Status: DC | PRN
  Administered 2017-08-20: 2 mg via INTRAVENOUS
  Filled 2017-08-20: qty 1

## 2017-08-20 MED ORDER — ACETAMINOPHEN 500 MG PO TABS
1000.0000 mg | ORAL_TABLET | ORAL | Status: AC
  Administered 2017-08-20: 1000 mg via ORAL
  Filled 2017-08-20: qty 2

## 2017-08-20 MED ORDER — EPHEDRINE 5 MG/ML INJ
INTRAVENOUS | Status: AC
  Filled 2017-08-20: qty 10

## 2017-08-20 MED ORDER — SUGAMMADEX SODIUM 500 MG/5ML IV SOLN
INTRAVENOUS | Status: AC
  Filled 2017-08-20: qty 5

## 2017-08-20 MED ORDER — ONDANSETRON HCL 4 MG/2ML IJ SOLN
INTRAMUSCULAR | Status: DC | PRN
  Administered 2017-08-20: 4 mg via INTRAVENOUS

## 2017-08-20 MED ORDER — GABAPENTIN 300 MG PO CAPS
900.0000 mg | ORAL_CAPSULE | Freq: Three times a day (TID) | ORAL | Status: DC
  Administered 2017-08-20 – 2017-08-22 (×6): 900 mg via ORAL
  Filled 2017-08-20 (×6): qty 3

## 2017-08-20 SURGICAL SUPPLY — 75 items
BENZOIN TINCTURE PRP APPL 2/3 (GAUZE/BANDAGES/DRESSINGS) ×4 IMPLANT
BINDER ABDOMINAL 12 ML 46-62 (SOFTGOODS) ×4 IMPLANT
BLADE HEX COATED 2.75 (ELECTRODE) ×4 IMPLANT
BLADE SURG 15 STRL LF DISP TIS (BLADE) ×2 IMPLANT
BLADE SURG 15 STRL SS (BLADE) ×2
BLADE SURG SZ10 CARB STEEL (BLADE) ×4 IMPLANT
CABLE HIGH FREQUENCY MONO STRZ (ELECTRODE) IMPLANT
CHLORAPREP W/TINT 26ML (MISCELLANEOUS) ×4 IMPLANT
CLOSURE WOUND 1/2 X4 (GAUZE/BANDAGES/DRESSINGS) ×1
COVER SURGICAL LIGHT HANDLE (MISCELLANEOUS) ×4 IMPLANT
DECANTER SPIKE VIAL GLASS SM (MISCELLANEOUS) ×4 IMPLANT
DERMABOND ADVANCED (GAUZE/BANDAGES/DRESSINGS) ×2
DERMABOND ADVANCED .7 DNX12 (GAUZE/BANDAGES/DRESSINGS) ×2 IMPLANT
DEVICE SECURE STRAP 25 ABSORB (INSTRUMENTS) ×4 IMPLANT
DEVICE TROCAR PUNCTURE CLOSURE (ENDOMECHANICALS) ×4 IMPLANT
DISSECTOR ROUND CHERRY 3/8 STR (MISCELLANEOUS) IMPLANT
DRAIN PENROSE 18X1/2 LTX STRL (DRAIN) ×4 IMPLANT
DRAPE LAPAROTOMY T 102X78X121 (DRAPES) ×4 IMPLANT
DRAPE LAPAROTOMY TRNSV 102X78 (DRAPE) ×4 IMPLANT
DRSG TEGADERM 4X4.75 (GAUZE/BANDAGES/DRESSINGS) ×4 IMPLANT
ELECT PENCIL ROCKER SW 15FT (MISCELLANEOUS) ×4 IMPLANT
ELECT REM PT RETURN 15FT ADLT (MISCELLANEOUS) ×4 IMPLANT
GAUZE SPONGE 4X4 12PLY STRL (GAUZE/BANDAGES/DRESSINGS) ×4 IMPLANT
GLOVE BIO SURGEON STRL SZ 6.5 (GLOVE) ×3 IMPLANT
GLOVE BIO SURGEONS STRL SZ 6.5 (GLOVE) ×1
GLOVE BIOGEL PI IND STRL 6.5 (GLOVE) ×2 IMPLANT
GLOVE BIOGEL PI IND STRL 7.0 (GLOVE) ×2 IMPLANT
GLOVE BIOGEL PI IND STRL 7.5 (GLOVE) ×8 IMPLANT
GLOVE BIOGEL PI INDICATOR 6.5 (GLOVE) ×2
GLOVE BIOGEL PI INDICATOR 7.0 (GLOVE) ×2
GLOVE BIOGEL PI INDICATOR 7.5 (GLOVE) ×8
GLOVE SURG SIGNA 7.5 PF LTX (GLOVE) ×4 IMPLANT
GOWN SPEC L4 XLG W/TWL (GOWN DISPOSABLE) ×8 IMPLANT
GOWN STRL REUS W/ TWL XL LVL3 (GOWN DISPOSABLE) ×6 IMPLANT
GOWN STRL REUS W/TWL LRG LVL3 (GOWN DISPOSABLE) ×4 IMPLANT
GOWN STRL REUS W/TWL XL LVL3 (GOWN DISPOSABLE) ×18 IMPLANT
IRRIG SUCT STRYKERFLOW 2 WTIP (MISCELLANEOUS)
IRRIGATION SUCT STRKRFLW 2 WTP (MISCELLANEOUS) IMPLANT
KIT BASIN OR (CUSTOM PROCEDURE TRAY) ×4 IMPLANT
MESH PARIETEX 4.7 (Mesh General) ×4 IMPLANT
MESH ULTRAPRO 3X6 7.6X15CM (Mesh General) ×4 IMPLANT
NEEDLE HYPO 22GX1.5 SAFETY (NEEDLE) ×4 IMPLANT
NEEDLE HYPO 25X1 1.5 SAFETY (NEEDLE) ×4 IMPLANT
PACK BASIC VI WITH GOWN DISP (CUSTOM PROCEDURE TRAY) ×4 IMPLANT
SHEARS HARMONIC ACE PLUS 36CM (ENDOMECHANICALS) ×4 IMPLANT
SLEEVE ADV FIXATION 5X100MM (TROCAR) ×4 IMPLANT
SLEEVE XCEL OPT CAN 5 100 (ENDOMECHANICALS) ×4 IMPLANT
SOL PREP POV-IOD 4OZ 10% (MISCELLANEOUS) ×4 IMPLANT
SPONGE LAP 18X18 RF (DISPOSABLE) ×4 IMPLANT
SPONGE LAP 18X18 X RAY DECT (DISPOSABLE) ×4 IMPLANT
SPONGE LAP 4X18 RFD (DISPOSABLE) ×8 IMPLANT
STRIP CLOSURE SKIN 1/2X4 (GAUZE/BANDAGES/DRESSINGS) ×3 IMPLANT
SUT CHROMIC 0 SH (SUTURE) IMPLANT
SUT MNCRL AB 4-0 PS2 18 (SUTURE) ×4 IMPLANT
SUT NOVA 0 T19/GS 22DT (SUTURE) ×16 IMPLANT
SUT NOVA NAB DX-16 0-1 5-0 T12 (SUTURE) IMPLANT
SUT PROLENE 0 CT 2 (SUTURE) ×4 IMPLANT
SUT VIC AB 2-0 SH 18 (SUTURE) ×4 IMPLANT
SUT VIC AB 2-0 SH 27 (SUTURE) ×4
SUT VIC AB 2-0 SH 27X BRD (SUTURE) ×4 IMPLANT
SUT VIC AB 3-0 SH 18 (SUTURE) ×4 IMPLANT
SUT VIC AB 4-0 PS2 27 (SUTURE) IMPLANT
SUT VIC AB 5-0 P-3 18XBRD (SUTURE) IMPLANT
SUT VIC AB 5-0 P3 18 (SUTURE)
SYR BULB IRRIGATION 50ML (SYRINGE) ×4 IMPLANT
SYR CONTROL 10ML LL (SYRINGE) ×4 IMPLANT
TAPE CLOTH SURG 4X10 WHT LF (GAUZE/BANDAGES/DRESSINGS) ×4 IMPLANT
TOWEL OR 17X26 10 PK STRL BLUE (TOWEL DISPOSABLE) ×4 IMPLANT
TOWEL OR NON WOVEN STRL DISP B (DISPOSABLE) ×4 IMPLANT
TRAY LAPAROSCOPIC (CUSTOM PROCEDURE TRAY) ×4 IMPLANT
TROCAR ADV FIXATION 5X100MM (TROCAR) ×4 IMPLANT
TROCAR BLADELESS OPT 5 100 (ENDOMECHANICALS) ×4 IMPLANT
TROCAR XCEL BLUNT TIP 100MML (ENDOMECHANICALS) IMPLANT
TROCAR XCEL NON-BLD 11X100MML (ENDOMECHANICALS) IMPLANT
YANKAUER SUCT BULB TIP 10FT TU (MISCELLANEOUS) ×4 IMPLANT

## 2017-08-20 NOTE — Op Note (Signed)
OPERATIVE NOTE  08/20/2017  12:11 PM  PATIENT:  Misty [name], 57 y.o., female, MRN: [id number]  PREOP DIAGNOSIS:  Chronically incarcerated right inguinal hernia, Umbilical hernia  POSTOP DIAGNOSIS:   Chronically incarcerated indirect right inguinal hernia, Umbilical hernia  PROCEDURE:   Procedure(s): Diagnostic laparoscopy, OPEN RIGHT INGUINAL HERNIA REPAIR with INSERTION OF MESH, LAPAROSCOPIC REPAIR UMBILICAL HERNIA  SURGEON:   Alphonsa Overall, M.D.  ASSISTANT:   None  ANESTHESIA:   general  Anesthesiologist: Barnet Glasgow, MD CRNA: Lissa Morales, CRNA; Sharlette Dense, CRNA  General  EBL:  minimal  ml  BLOOD ADMINISTERED: none  DRAINS: none   LOCAL MEDICATIONS USED:   30 cc of 1/4% marcaine with 20 cc of exparel  SPECIMEN:   None  COUNTS CORRECT:  YES  INDICATIONS FOR PROCEDURE:  Misty [name] is a 57 y.o. (DOB: 1960/08/21) white female whose primary care physician is Patient, No Pcp Per and comes for  Diagnostic laparoscopy, OPEN RIGHT INGUINAL HERNIA REPAIR with INSERTION OF MESH, LAPAROSCOPIC REPAIR UMBILICAL HERNIA.   The indications and risks of the surgery were explained to the patient.  The risks include, but are not limited to, infection, bleeding, and nerve injury.  OPERATIVE NOTE:  The patient was taken to room [facility] at [facility].  He underwent a general anesthesia.  He was given 2 grams of Ancef at the beginning of the operation.   A time out was held and the surgical checklist run.   The abdomen was prepped with Cholroprep and sterilely draped.  I covered the abdomen with a Ioban drape.   I accessed the LUQ with a 5 mm trocar.  An additional 5 mm trocar was placed in the left mid abdomen and a 5 mm trocar in the RUQ.  I later upsized the LUQ trocar to a 10 mm trocar to insert the mesh.   I first repaired the right inguinal hernia.  I used the laparoscope to guide the dissection of the hernia sac and to make sure no bowel was caught in the hernia.  By CT  scan there was right colon and small bowel in the hernia.  I could not reduce the hernia with her awake.   A right inguinal incision was made through the subcutaneous fat.  She had a large hernia sac that dissected into the subcutaneous tissues.  It was about 10 cm in diameter.  I dissected this sac free.  I also removed a large wad of fat, about 10 cm in diameter, from the subcutaneous tissues. .   I opened up the external oblique facia to get down to around the neck of the hernia.  Despite the hernia sac being large, the neck of the hernia was about 4 cm in diameter.   The patient had an indirect inguinal hernia.  I kept checking the dissection laparoscopically.   The sac was dissected free of the surrounding structures and I was able to reduce the hernia sac into the peritoneal cavity.  I decided to reduce the hernia sac and not to resect it.  I oversewed the fascia/hernia defect with 2-0 vicryl.   I was able to examine this laparoscopically.    The inguinal floor was repaired with a 3 x 6 inch piece of Ultrapro mesh.  The mesh was cut to fit the inguinal floor.  The mesh was sewn in place with interrupted 0 Novafil suture.  I sewed the mesh medially to the pubic tubercle, inferiorly to the shelving edge  of the inguinal ligament, and superiorly to the transversalis fascia.   The mesh lay flat.  The inguinal floor was .  For right inguinal hernia: Type of repair - mesh  (choices - primary suture, mesh, or component)  Name of mesh - Ultrapro  Size of mesh - Length 6 cm, Width 14 cm  Mesh overlap - 3 cm  Placement of mesh - between the muscle and fascia  (choices - beneath fascia and into peritoneal cavity, beneath fascia but external to peritoneal cavity, between the muscle and fascia, above or external to fascia)     She had a 2 cm defect at the umbilicus. I already had my laparoscopic instruments in place.   I did not try to close the umbilical defect.  I did "de-fat" the peritoneal fat that  was in the hernia and I took down some of the falciform ligament.   Then I placed a 12 cm Parietex mesh in the abdomen.  It had 4 holding sutures of 0 Novafil.  These were tied down to secure the mesh to the anterior abdominal wall.  I then used 25 Securestrap tacks to tack the mesh to the anterior abdominal wall.   The abdomen was deflated.  The mesh was inspected and there were no defects around the edge.   The trocars were then removed.  There was no bleeding at the trocar sites.  The incisions were closed with 4-0 Monocryl and painted with DermaBond. The puncture sites were painted with DermaBond.   The sponge and needle count were correct.  The patient had an abdominal binder placed.  The patient was transferred to the recovery room in good condition.   For umbilical hernia: Type of repair - mesh  (choices - primary suture, mesh, or component)  Name of mesh - Parietex  Size of mesh - Length 12 cm, Width 12 cm  Mesh overlap - 5 cm  Placement of mesh - beneath fascia and into the peritoneal cavity  (choices - beneath fascia and into peritoneal cavity, beneath fascia but external to peritoneal cavity, between the muscle and fascia, above or external to fascia)  Alphonsa Overall, MD, [facility] Pager: [phone] Office phone:  [phone]

## 2017-08-20 NOTE — Anesthesia Postprocedure Evaluation (Signed)
Anesthesia Post Note  Patient: Diplomatic Services operational officer  Procedure(s) Performed: OPEN RIGHT INGUINAL HERNIA REPAIR ERA PATHWAY (Right Abdomen) INSERTION OF MESH (Right Abdomen) ABDOMINAL LAPAROSCOPY (N/A Abdomen) HERNIA REPAIR UMBILICAL ADULT (N/A Abdomen)     Patient location during evaluation: PACU Anesthesia Type: General Level of consciousness: awake and alert Pain management: pain level controlled Vital Signs Assessment: post-procedure vital signs reviewed and stable Respiratory status: spontaneous breathing, nonlabored ventilation, respiratory function stable and patient connected to nasal cannula oxygen Cardiovascular status: blood pressure returned to baseline and stable Postop Assessment: no apparent nausea or vomiting Anesthetic complications: no    Last Vitals:  Vitals:   08/20/17 1330 08/20/17 1348  BP:  (!) 154/94  Pulse:  62  Resp:  16  Temp: 36.4 C 36.5 C  SpO2:  98%    Last Pain:  Vitals:   08/20/17 1348  TempSrc: Oral  PainSc:                  Barnet Glasgow

## 2017-08-20 NOTE — Discharge Instructions (Signed)
[facility] - DISCHARGE INSTRUCTIONS TO PATIENT  Activity:  Driving - May drive in 2 to 4 days, if off pain meds   Lifting - No lifting more than 15 pounds for 3 weeks  Wound Care:   May shower starting Sunday, 8/11.       Wear the abdominal binder when out of bed.  Diet:  As tolerated  Follow up appointment:  Call Dr. Pollie Friar office [facility]) at [phone] for an appointment in 2 to 3 weeks.  Medications and dosages:  Resume your home medications.  You have a prescription for:  Vicodin  Call Dr. Lucia Gaskins or his office  [phone]) if you have:  Temperature greater than 100.4,  Persistent nausea and vomiting,  Severe uncontrolled pain,  Redness, tenderness, or signs of infection (pain, swelling, redness, odor or green/yellow discharge around the site),  Any other questions or concerns you may have after discharge.  In an emergency, call 911 or go to an Emergency Department at a nearby hospital.

## 2017-08-20 NOTE — Progress Notes (Signed)
Call on 08-20-17 to let the pt know to call r/s appt lvm

## 2017-08-20 NOTE — Interval H&P Note (Signed)
History and Physical Interval Note:  08/20/2017 8:51 AM  Misty [name]  has presented today for surgery, with the diagnosis of Right inguinal hernia and umbilical hernia.  Her husband is at the bedside.  She is ready for surgery.  The various methods of treatment have been discussed with the patient and family. After consideration of risks, benefits and other options for treatment, the patient has consented to  Procedure(s): OPEN RIGHT INGUINAL HERNIA REPAIR ERA PATHWAY (Right) INSERTION OF MESH (Right) POSSIBLE LAPAROSCOPY (N/A) HERNIA REPAIR UMBILICAL ADULT (N/A) as a surgical intervention .  The patient's history has been reviewed, patient examined, no change in status, stable for surgery.  I have reviewed the patient's chart and labs.  Questions were answered to the patient's satisfaction.     Shann Medal

## 2017-08-20 NOTE — Anesthesia Procedure Notes (Signed)
Procedure Name: Intubation Date/Time: 08/20/2017 9:30 AM Performed by: Sharlette Dense, CRNA Patient Re-evaluated:Patient Re-evaluated prior to induction Oxygen Delivery Method: Circle system utilized Preoxygenation: Pre-oxygenation with 100% oxygen Induction Type: IV induction Ventilation: Mask ventilation without difficulty and Oral airway inserted - appropriate to patient size Laryngoscope Size: Sabra Heck and 2 Grade View: Grade I Tube type: Oral Tube size: 7.5 mm Number of attempts: 1 Airway Equipment and Method: Stylet Placement Confirmation: ETT inserted through vocal cords under direct vision,  positive ETCO2 and breath sounds checked- equal and bilateral Secured at: 21 cm Tube secured with: Tape Dental Injury: Teeth and Oropharynx as per pre-operative assessment

## 2017-08-20 NOTE — Transfer of Care (Signed)
Immediate Anesthesia Transfer of Care Note  Patient: Misty [name]  Procedure(s) Performed: OPEN RIGHT INGUINAL HERNIA REPAIR ERA PATHWAY (Right Abdomen) INSERTION OF MESH (Right Abdomen) ABDOMINAL LAPAROSCOPY (N/A Abdomen) HERNIA REPAIR UMBILICAL ADULT (N/A Abdomen)  Patient Location: PACU  Anesthesia Type:General  Level of Consciousness: awake, alert  and oriented  Airway & Oxygen Therapy: Patient Spontanous Breathing and Patient connected to face mask oxygen  Post-op Assessment: Report given to RN and Post -op Vital signs reviewed and stable  Post vital signs: Reviewed and stable  Last Vitals:  Vitals Value Taken Time  BP 160/86 08/20/2017 12:23 PM  Temp    Pulse 67 08/20/2017 12:24 PM  Resp 15 08/20/2017 12:24 PM  SpO2 100 % 08/20/2017 12:24 PM  Vitals shown include unvalidated device data.  Last Pain:  Vitals:   08/20/17 0755  TempSrc:   PainSc: 0-No pain      Patients Stated Pain Goal: 4 (82/[id number]/78 [id number])  Complications: No apparent anesthesia complications

## 2017-08-21 DIAGNOSIS — K4030 Unilateral inguinal hernia, with obstruction, without gangrene, not specified as recurrent: Secondary | ICD-10-CM | POA: Diagnosis not present

## 2017-08-21 NOTE — Progress Notes (Signed)
Dr. Kae Heller aware via phone pt decided to stay til tomorrow. New order received to cancel discharge for today.

## 2017-08-21 NOTE — Discharge Summary (Signed)
Physician Discharge Summary  Patient ID: Misty [name] MRN: [id number] DOB/AGE: [birth year] 57 y.o.  Admit date: 08/20/2017 Discharge date: 08/22/2017  Admission Diagnoses:  Discharge Diagnoses:  Active Problems:   Incarcerated right inguinal hernia   Discharged Condition: good  Hospital Course: Admitted for supportive care following open repair of large right inguinal hernia and laparoscopic umbilical hernia repair   Discharge Exam: Blood pressure 113/65, pulse 72, temperature 98.6 F (37 C), temperature source Oral, resp. rate 18, height 5\' 3"  (1.6 m), weight 106.4 kg, last menstrual period 05/31/2016, SpO2 92 %. General appearance: alert and cooperative Resp: clear to auscultation bilaterally GI: soft, non-tender; bowel sounds normal; no masses,  no organomegaly and hernia repair intact Skin: Skin color, texture, turgor normal. No rashes or lesions Incision/Wound: port sits c/d/i with dermabond. Right groin incision c/d/i with staples, no swelling or erythema  Disposition: Discharge disposition: 01-Home or Self Care        Allergies as of 08/22/2017   No Known Allergies     Medication List    TAKE these medications   acetaminophen 500 MG tablet Commonly known as:  TYLENOL Take 1,000 mg by mouth every 6 (six) hours as needed for mild pain.   ALPRAZolam 0.5 MG tablet Commonly known as:  XANAX Take 0.25-0.5 mg by mouth daily as needed for anxiety (FOR PANIC/ANXIETY).   b complex vitamins tablet Take 2 tablets by mouth daily.   calcium-vitamin D 500-200 MG-UNIT tablet Commonly known as:  OSCAL WITH D Take 2 tablets by mouth daily with breakfast.   citalopram 40 MG tablet Commonly known as:  CELEXA Take 40 mg daily by mouth.   gabapentin 300 MG capsule Commonly known as:  NEURONTIN TAKE 3 CAPSULES (900 MG TOTAL) BY MOUTH 3 (THREE) TIMES DAILY.   nystatin cream Commonly known as:  MYCOSTATIN Apply to affected area 2 times daily         Signed: Clovis Riley 08/22/2017, 8:29 AM

## 2017-08-21 NOTE — Progress Notes (Signed)
1 Day Post-Op   Subjective/Chief Complaint: Has walked some. Passing flatus. Tolerated regular diet for breakfast,   Objective: Vital signs in last 24 hours: Temp:  [97.3 F (36.3 C)-98.5 F (36.9 C)] 98.1 F (36.7 C) (08/10 0507) Pulse Rate:  [49-69] 52 (08/10 0507) Resp:  [11-18] 16 (08/10 0507) BP: (117-161)/(68-94) 131/81 (08/10 0507) SpO2:  [91 %-100 %] 98 % (08/10 0507)    Intake/Output from previous day: 08/09 0701 - 08/10 0700 In: 3463.1 [P.O.:890; I.V.:2473.1; IV Piggyback:100] Out: 1550 [Urine:1500; Blood:50] Intake/Output this shift: No intake/output data recorded.  General appearance: alert and cooperative Resp: clear to auscultation bilaterally GI: soft, obese, mildly tender at incisions  Lab Results:  No results for input(s): WBC, HGB, HCT, PLT in the last 72 hours. BMET No results for input(s): NA, K, CL, CO2, GLUCOSE, BUN, CREATININE, CALCIUM in the last 72 hours. PT/INR No results for input(s): LABPROT, INR in the last 72 hours. ABG No results for input(s): PHART, HCO3 in the last 72 hours.  Invalid input(s): PCO2, PO2  Studies/Results: No results found.  Anti-infectives: Anti-infectives (From admission, onward)   Start     Dose/Rate Route Frequency Ordered Stop   08/20/17 0745  ceFAZolin (ANCEF) IVPB 2g/100 mL premix     2 g 200 mL/hr over 30 Minutes Intravenous On call to O.R. 08/20/17 0730 08/20/17 0950      Assessment/Plan: s/p Procedure(s): OPEN RIGHT INGUINAL HERNIA REPAIR ERA PATHWAY (Right) INSERTION OF MESH (Right) ABDOMINAL LAPAROSCOPY (N/A) HERNIA REPAIR UMBILICAL ADULT (N/A) Continue IS and ambulation. Will plan for discharge later today.  LOS: 0 days    Clovis Riley 08/21/2017

## 2017-08-22 DIAGNOSIS — K4030 Unilateral inguinal hernia, with obstruction, without gangrene, not specified as recurrent: Secondary | ICD-10-CM | POA: Diagnosis not present

## 2017-08-22 MED ORDER — DOCUSATE SODIUM 100 MG PO CAPS: 100.0000 mg | ORAL_CAPSULE | Freq: Two times a day (BID) | ORAL | Status: DC

## 2017-08-22 NOTE — Progress Notes (Signed)
Pt to d/c home with family in stable condition. No needs at time of d/c.

## 2017-08-22 NOTE — Plan of Care (Signed)
Pt stable this am with no needs. Pt was able to walk in the hall with walker. No home equipment needed. Pt to d/c home today.

## 2017-08-23 ENCOUNTER — Encounter (HOSPITAL_COMMUNITY): Payer: Self-pay | Admitting: Surgery

## 2017-08-23 ENCOUNTER — Telehealth: Payer: Self-pay | Admitting: *Deleted

## 2017-08-23 NOTE — Telephone Encounter (Signed)
CALLED PATIENT TO ASK ABOUT RESCHEDULING FU ON 09-27-17 DUE TO DR. KINARD BEING OUT OF THE OFFICE, RESCHEDULED FOR 09-30-17 @ 9:15 AM, LVM FOR A RETURN CALL

## 2017-09-11 ENCOUNTER — Other Ambulatory Visit: Payer: Self-pay | Admitting: Hematology and Oncology

## 2017-09-27 ENCOUNTER — Ambulatory Visit: Payer: Self-pay | Admitting: Radiation Oncology

## 2017-09-30 ENCOUNTER — Other Ambulatory Visit: Payer: Self-pay

## 2017-09-30 ENCOUNTER — Ambulatory Visit
Admission: RE | Admit: 2017-09-30 | Discharge: 2017-09-30 | Disposition: A | Discharge status: Home / Self Care | Payer: Managed Care, Other (non HMO) | Source: Ambulatory Visit | Attending: Radiation Oncology | Admitting: Radiation Oncology

## 2017-09-30 ENCOUNTER — Encounter: Payer: Self-pay | Admitting: Radiation Oncology

## 2017-09-30 VITALS — BP 122/75 | HR 68 | Temp 98.1°F | Resp 18 | Wt 241.2 lb

## 2017-09-30 DIAGNOSIS — C541 Malignant neoplasm of endometrium: Secondary | ICD-10-CM | POA: Diagnosis not present

## 2017-09-30 DIAGNOSIS — Z79899 Other long term (current) drug therapy: Secondary | ICD-10-CM | POA: Insufficient documentation

## 2017-09-30 NOTE — Progress Notes (Signed)
Pt presents today for f/u appt with Dr. Sondra Come. Pt is accompanied by husband. Pt denies dysuria/hematuria. Pt denies vaginal bleeding/discharge. Pt denies rectal bleeding. Pt endorses occasional constipation and is controlled with diet. Pt denies N/V.  BP 122/75 (BP Location: Left Arm, Patient Position: Sitting)   Pulse 68   Temp 98.1 F (36.7 C) (Oral)   Resp 18   Wt 241 lb 4 oz (109.4 kg)   LMP 05/31/2016 (Approximate)   SpO2 98%   BMI 42.74 kg/m   Wt Readings from Last 3 Encounters:  09/30/17 241 lb 4 oz (109.4 kg)  08/20/17 234 lb 8 oz (106.4 kg)  08/17/17 234 lb 8 oz (106.4 kg)   Loma Sousa, RN BSN

## 2017-09-30 NOTE — Progress Notes (Signed)
Radiation Oncology         [phone] ________________________________  Name: Misty [name] MRN: [id number]  Date: 09/30/2017  DOB: Apr 24, 1960  Follow-Up Visit Note  CC: Patient, No Pcp Per  Everitt Amber, MD    ICD-10-CM   1. 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  Narrative:  The patient returns today for routine follow-up. She is accompanied by her husband. Since her last visit, she reports experiencing an inguinal hernia as well as an umbilical hernia. She underwent surgery on 08/20/17.                 ALLERGIES:  has No Known Allergies.  Meds: Current Outpatient Medications  Medication Sig Dispense Refill  . acetaminophen (TYLENOL) 500 MG tablet Take 1,000 mg by mouth every 6 (six) hours as needed for mild pain.     Marland Kitchen ALPRAZolam (XANAX) 0.5 MG tablet Take 0.25-0.5 mg by mouth daily as needed for anxiety (FOR PANIC/ANXIETY).     Marland Kitchen b complex vitamins tablet Take 2 tablets by mouth daily.     . calcium-vitamin D (OSCAL WITH D) 500-200 MG-UNIT tablet Take 2 tablets by mouth daily with breakfast.     . citalopram (CELEXA) 40 MG tablet Take 40 mg daily by mouth.  1  . gabapentin (NEURONTIN) 300 MG capsule TAKE 3 CAPSULES (900 MG TOTAL) BY MOUTH 2 (TWO) TIMES DAILY. 540 capsule 1  . nystatin cream (MYCOSTATIN) Apply to affected area 2 times daily 30 g 1   No current facility-administered medications for this encounter.    Review of Systems:  A 10+ POINT REVIEW OF SYSTEMS WAS OBTAINED including neurology, dermatology, psychiatry, cardiac, respiratory, lymph, extremities, GI, GU, musculoskeletal, constitutional, reproductive, HEENT. She reports continued neuropathy in her feet. She denies dysuria or hematuria, vaginal bleeding or discharge, rectal bleeding, and nausea or vomiting. She notes occasional constipation  and is controlled with diet.  Physical Findings: The patient is in no acute distress. Patient is alert and oriented.  weight is 241 lb 4 oz (109.4 kg). Her oral temperature is 98.1 F (36.7 C). Her blood pressure is 122/75 and her pulse is 68. Her respiration is 18 and oxygen saturation is 98%. .  Lungs are clear to auscultation bilaterally. Heart has regular rate and rhythm. No palpable cervical, supraclavicular, or axillary adenopathy. Abdomen soft, non-tender, normal bowel sounds.  On pelvic examination the external genitalia were unremarkable. A speculum exam was performed. There are no mucosal lesions noted in the vaginal vault. On bimanual and rectovaginal examination there were no pelvic masses appreciated.   Lab Findings: Lab Results  Component Value Date   WBC 7.6 08/17/2017   HGB 14.4 08/17/2017   HCT 44.2 08/17/2017   MCV 98.4 08/17/2017   PLT 254 08/17/2017    Radiographic Findings: No results found.  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  Plan:  Routine follow-up in radiation oncology in 6 months. The patient is scheduled to follow-up with Dr. Denman George on 12/24/17.  -----------------------------------  Blair Promise, PhD, MD  This document serves as a record of services personally performed by Gery Pray, MD. It was created on his behalf by Wilburn Mylar, a trained medical scribe. The creation of this record is based on the scribe's personal observations  and the provider's statements to them. This document has been checked and approved by the attending provider.

## 2017-11-12 ENCOUNTER — Ambulatory Visit (INDEPENDENT_AMBULATORY_CARE_PROVIDER_SITE_OTHER): Payer: Managed Care, Other (non HMO) | Admitting: Neurology

## 2017-11-12 ENCOUNTER — Encounter: Payer: Self-pay | Admitting: Neurology

## 2017-11-12 VITALS — BP 124/90 | HR 68 | Ht 64.0 in | Wt 243.5 lb

## 2017-11-12 DIAGNOSIS — R29898 Other symptoms and signs involving the musculoskeletal system: Secondary | ICD-10-CM | POA: Diagnosis not present

## 2017-11-12 DIAGNOSIS — T451X5A Adverse effect of antineoplastic and immunosuppressive drugs, initial encounter: Secondary | ICD-10-CM | POA: Diagnosis not present

## 2017-11-12 DIAGNOSIS — G620 Drug-induced polyneuropathy: Secondary | ICD-10-CM

## 2017-11-12 NOTE — Patient Instructions (Signed)
Continue gabapentin 900mg  three times daily  Return to clinic in 1 year

## 2017-11-12 NOTE — Progress Notes (Signed)
Follow-up Visit   Date: 11/12/17    Misty [name] MRN: [id number] DOB: 10/15/1960   Interim History: Misty [name] is a 57 y.o. Caucasian female with endometrial cancer s/p robotic assisted total hysterectomy and BSO, radiation and chemotheray, anxiety and panic attacks handed  returning to the clinic for follow-up of chemotherapy-induced neuropathy.  The patient was accompanied to the clinic by husband who also provides collateral information.    History of present illness: She was diagnosed with endometrial cancer in June 2018 and started on chemotherapy with carboplatin and taxol in July - November 2018.  Prior to her 5th chemotherapy cycle, she started having numbness and tingling of the fingers and feet which progressed after her 5th session, so her taxol was discontinued.  She was able to complete her chemotherapy. Since this time, she has constant cold-burning sensation of the feet to the level of the ankles.  She also has intermittent shooting and stabbing pain.  She takes gabapentin 900mg  three times daily, which signficantly improves her shooting pain. She does not have any relief with her constant burning pain.  She walks unassisted and has some imbalance.  Fortunately, no falls.  She has weakness of the hands and has difficulty opening water bottles and drops objects.    Prior to diagnosis of endometrial cancer, she was drinking at least two glasses of wine per day for many years.  No history of diabetes, thyroid disease, or family history of neuropathy.  UPDATE 11/12/2017:  She has many questions regarding gabapentin regarding indications of use.  She continues to take gabapentin 900mg  three times daily which helps neuropathy pain.  Sometimes she has breakthrough pain and it is worse. She does not want to increase the dose.  She tripped over a dog toy last week and bruised her right arm and has swelling of the left foot. Since this time, she has been using a walker to  minimize impact on the left foot.  She walks unassisted normally and for short distances.   Medications:  Current Outpatient Medications on File Prior to Visit  Medication Sig Dispense Refill  . acetaminophen (TYLENOL) 500 MG tablet Take 1,000 mg by mouth every 6 (six) hours as needed for mild pain.     Marland Kitchen ALPRAZolam (XANAX) 0.5 MG tablet Take 0.25-0.5 mg by mouth daily as needed for anxiety (FOR PANIC/ANXIETY).     Marland Kitchen b complex vitamins tablet Take 2 tablets by mouth daily.     . calcium-vitamin D (OSCAL WITH D) 500-200 MG-UNIT tablet Take 2 tablets by mouth daily with breakfast.     . citalopram (CELEXA) 40 MG tablet Take 40 mg daily by mouth.  1  . gabapentin (NEURONTIN) 300 MG capsule TAKE 3 CAPSULES (900 MG TOTAL) BY MOUTH 2 (TWO) TIMES DAILY. (Patient taking differently: Take 900 mg by mouth 3 (three) times daily. ) 540 capsule 1  . nystatin cream (MYCOSTATIN) Apply to affected area 2 times daily 30 g 1   No current facility-administered medications on file prior to visit.     Allergies: No Known Allergies  Review of Systems:  CONSTITUTIONAL: No fevers, chills, night sweats, or weight loss.  EYES: No visual changes or eye pain ENT: No hearing changes.  No history of nose bleeds.   RESPIRATORY: No cough, wheezing and shortness of breath.   CARDIOVASCULAR: Negative for chest pain, and palpitations.   GI: Negative for abdominal discomfort, blood in stools or black stools.  No recent change in bowel  habits.   GU:  No history of incontinence.   MUSCLOSKELETAL: No history of joint pain or swelling.  No myalgias.   SKIN: +for lesions, rash, and itching.   ENDOCRINE: Negative for cold or heat intolerance, polydipsia or goiter.   PSYCH:  No depression or anxiety symptoms.   NEURO: As Above.   Vital Signs:  BP 124/90   Pulse 68   Ht 5\' 4"  (1.626 m)   Wt 243 lb 8 oz (110.5 kg)   LMP 05/31/2016 (Approximate)   SpO2 98%   BMI 41.80 kg/m   General Medical Exam:   General:  Well  appearing, comfortable, obese, hair thinng  Eyes/ENT: see cranial nerve examination.   Neck: No masses appreciated.  Full range of motion without tenderness.  No carotid bruits. Respiratory:  Clear to auscultation, good air entry bilaterally.   Cardiac:  Regular rate and rhythm, no murmur.   Ext:  Mild edema of the left foot, right upper arm with ecchymosis  Neurological Exam: MENTAL STATUS including orientation to time, place, person, recent and remote memory, attention span and concentration, language, and fund of knowledge is normal.  Speech is not dysarthric.  CRANIAL NERVES:   Pupils equal round and reactive to light.  Normal conjugate, extra-ocular eye movements in all directions of gaze.  No ptosis. Face is symmetric. Palate elevates symmetrically.  Tongue is midline.  MOTOR:  Motor strength is 5/5 in all extremities, except 4/5 right ABP.  No atrophy, fasciculations or abnormal movements.  No pronator drift.  Tone is normal.    MSRs:  Reflexes are brisk 2+/4 throughout, except absent Achilles.  SENSORY:  Vibration is absent at the great toe bilaterally, intact above the ankles.  COORDINATION/GAIT:   Intact rapid alternating movements bilaterally.  Gait slow, mildly wide-based and stable, assisted with walker. She is able to walk unassisted and appears stable.  Data: Lab Results  Component Value Date   TSH 1.91 05/10/2017   Lab Results  Component Value Date   HGBA1C 5.4 05/10/2017   Lab Results  Component Value Date   VITAMINB12 >1500 (H) 05/10/2017     IMPRESSION/PLAN: Chemotherapy-induced neuropathy.  Pain is relatively controlled on gabapentin 900 mg 3 times daily, which will be continued.  She does have spells of breakthrough pain, but does not want to increase the dose.  I addressed her questions regarding indication of use for gabapentin, and informed her that many neuropathic medications are used off label.  Specifically, gabapentin is a antiepileptic medication, but  more commonly used for neuropathic pain.  Fall precautions were discussed.  Right abductor pollicis brevis weakness may be an indication of underlying carpal tunnel syndrome.  She does not describe her right hand paresthesias and Tinel's is negative.  If this progresses, proceed with electrodiagnostic testing.  Return to clinic in 1 year.   Thank you for allowing me to participate in patient's care.  If I can answer any additional questions, I would be pleased to do so.    Sincerely,    Donika K. Posey Pronto, DO

## 2017-11-23 ENCOUNTER — Other Ambulatory Visit: Payer: Self-pay | Admitting: Neurology

## 2017-11-23 ENCOUNTER — Telehealth: Payer: Self-pay

## 2017-11-23 ENCOUNTER — Other Ambulatory Visit: Payer: Self-pay | Admitting: Hematology and Oncology

## 2017-11-23 NOTE — Telephone Encounter (Signed)
She called back and left a message that she is seeing a neurologist now.

## 2017-11-23 NOTE — Telephone Encounter (Signed)
Called and left a message for her to call the office. Received a request from pharmacy for Gabapentin Refill.

## 2017-12-17 ENCOUNTER — Other Ambulatory Visit: Payer: Self-pay | Admitting: Gynecologic Oncology

## 2017-12-17 DIAGNOSIS — Z1231 Encounter for screening mammogram for malignant neoplasm of breast: Secondary | ICD-10-CM

## 2017-12-24 ENCOUNTER — Encounter: Payer: Self-pay | Admitting: Gynecologic Oncology

## 2017-12-24 ENCOUNTER — Inpatient Hospital Stay: Payer: Managed Care, Other (non HMO) | Attending: Gynecologic Oncology | Admitting: Gynecologic Oncology

## 2017-12-24 VITALS — BP 136/67 | HR 76 | Temp 98.0°F | Resp 20 | Ht 65.0 in | Wt 247.7 lb

## 2017-12-24 DIAGNOSIS — Z923 Personal history of irradiation: Secondary | ICD-10-CM

## 2017-12-24 DIAGNOSIS — Z90710 Acquired absence of both cervix and uterus: Secondary | ICD-10-CM

## 2017-12-24 DIAGNOSIS — C541 Malignant neoplasm of endometrium: Secondary | ICD-10-CM | POA: Diagnosis present

## 2017-12-24 DIAGNOSIS — Z9221 Personal history of antineoplastic chemotherapy: Secondary | ICD-10-CM | POA: Diagnosis not present

## 2017-12-24 DIAGNOSIS — E259 Adrenogenital disorder, unspecified: Secondary | ICD-10-CM

## 2017-12-24 DIAGNOSIS — Z90722 Acquired absence of ovaries, bilateral: Secondary | ICD-10-CM

## 2017-12-24 NOTE — Progress Notes (Signed)
Follow-up Note: Gyn-Onc  Consult was requested by Dr. Ulanda Edison for the evaluation of Misty [name] 57 y.o. female  CC:  Chief Complaint  Patient presents with  . Endometrial cancer [facility])    Assessment/Plan:  Misty [name]  is a 57 y.o.  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  No active disease that is measurable.  We discussed symptoms of recurrence and she will see me for these should they develop.  She will continue in 3 monthly surveillance exams beginning in March with Dr Sondra Come and will see me in 6 months.   She was given the name of an endocrinologist to be evaluated for hyperandrogenism.   HPI: Misty [name] is a 57 year old woman who is seen in consultation at the request of Dr Ulanda Edison for grade 2 endometrial cancer.  The patient reports abnormal uterine bleeding for approximately 6 years (2-4 bleeding episodes per year). She had a particularly heavy bleeding episode on 05/21/16 and was seen in the ED. Hb was 7.2g/dL. She was started on Megace. Her bleeding improved.  [place] on 05/29/16 showed a uterus with 9x6x5.7cm dimensions and an endometrial stripe of 42mm.  Endometrial pipelle biopsy on 06/05/16 showed a grade 2 endometrial cancer.   On 06/25/16 she underwent robotic assisted total hysterectomy, BSO SLN biopsy. Final pathology showed a stage IIIC 1 grade 3 endometrioid endometrial cancer with deep myometrial invasion, + LVSI, and both right external iliac and right obturator SLN's (total of 5 in aggregate) positive for metastatic disease.   CT chest/abdo/pelvis on 07/08/16 showed no gross residual disease.  Postoperatively she has done well with no complaints.   She expressed deep concern about adjuvant therapies particularly with a concern that they may  induce lymphedema that was seen in her sister after treatment for breast cancer. She was considering no further treatment with the hope that she would have an enduring disease free interval (8+ years) with no side effects and good quality of life.  After hearing of the benefits of adjuvant therapy and consequences of declining therapy, she elected to undergo 6 cycles of carboplatin and paclitaxel with vaginal brachytherapy (30 Gy in 5 fractions). She completed therapy in November, 2018.  Interval Hx:  She tolerated therapy fairly well though she has some neuropathy. She has gas, but denies constipation.   She had indeterminate lymph nodes seen (pelvic) on her pre-treatment imaging in June, 2018. Repeat CT on 06/24/17 showed no evidence of lymphadenopathy.   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   Current Meds:  Outpatient Encounter Medications as of 12/24/2017  Medication Sig  . acetaminophen (TYLENOL) 500 MG tablet Take 1,000 mg by mouth every 6 (six) hours as needed for mild pain.   Marland Kitchen ALPRAZolam (XANAX) 0.5 MG tablet Take 0.25-0.5 mg by mouth daily as needed for anxiety (FOR PANIC/ANXIETY).   Marland Kitchen b complex vitamins tablet Take 2 tablets by mouth daily.   . citalopram (CELEXA) 40 MG tablet Take 40 mg daily by mouth.  . gabapentin (NEURONTIN) 300 MG capsule TAKE 3 CAPSULES (900 MG TOTAL) BY MOUTH 3 (THREE) TIMES DAILY.  Marland Kitchen gabapentin (NEURONTIN) 300 MG capsule TAKE 3 CAPSULES (900 MG TOTAL) BY MOUTH 2 (TWO) TIMES DAILY. (Patient taking differently: Take 900 mg by mouth 3 (three) times daily. )  . [DISCONTINUED] calcium-vitamin D (  OSCAL WITH D) 500-200 MG-UNIT tablet Take 2 tablets by mouth daily with breakfast.   . [DISCONTINUED] nystatin cream (MYCOSTATIN) Apply to affected area 2 times daily (Patient not taking: Reported on 12/24/2017)   No facility-administered encounter medications on file as of 12/24/2017.     Allergy: No Known Allergies  Social Hx:   Social History    Socioeconomic History  . Marital status: Married    Spouse name: [name]  . Number of children: 2  . Years of education: Not on file  . Highest education level: Professional school degree (e.g., MD, DDS, DVM, JD)  Occupational History  . Occupation: retired  Scientific laboratory technician  . Financial resource strain: Not on file  . Food insecurity:    Worry: Not on file    Inability: Not on file  . Transportation needs:    Medical: Not on file    Non-medical: Not on file  Tobacco Use  . Smoking status: Never Smoker  . Smokeless tobacco: Never Used  Substance and Sexual Activity  . Alcohol use: Not Currently    Alcohol/week: 1.0 standard drinks    Types: 1 Glasses of wine per week    Frequency: Never  . Drug use: No  . Sexual activity: Not on file  Lifestyle  . Physical activity:    Days per week: Not on file    Minutes per session: Not on file  . Stress: Not on file  Relationships  . Social connections:    Talks on phone: Not on file    Gets together: Not on file    Attends religious service: Not on file    Active member of club or organization: Not on file    Attends meetings of clubs or organizations: Not on file    Relationship status: Not on file  . Intimate partner violence:    Fear of current or ex partner: Not on file    Emotionally abused: Not on file    Physically abused: Not on file    Forced sexual activity: Not on file  Other Topics Concern  . Not on file  Social History Narrative   Lives with husband in a 2 story home.  Has 2 sons.  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     Past Surgical Hx:  Past Surgical History:  Procedure Laterality Date  . ANKLE FRACTURE SURGERY Right   . CHOLECYSTECTOMY    . INGUINAL HERNIA REPAIR Right 08/20/2017   Procedure: OPEN RIGHT INGUINAL HERNIA REPAIR ERA PATHWAY;  Surgeon: Alphonsa Overall, MD;  Location: [facility];  Service: General;  Laterality: Right;  . INSERTION OF MESH Right 08/20/2017   Procedure:  INSERTION OF MESH;  Surgeon: Alphonsa Overall, MD;  Location: [facility];  Service: General;  Laterality: Right;  . IR FLUORO GUIDE PORT INSERTION RIGHT  07/31/2016  . IR REMOVAL TUN ACCESS W/ PORT W/O FL MOD SED  01/13/2017  . IR US GUIDE VASC ACCESS RIGHT  07/31/2016  . LAPAROSCOPY N/A 08/20/2017   Procedure: ABDOMINAL LAPAROSCOPY;  Surgeon: Alphonsa Overall, MD;  Location: [facility];  Service: General;  Laterality: N/A;  . LYMPH NODE BIOPSY N/A 06/25/2016   Procedure: LYMPH NODE BIOPSY;  Surgeon: Everitt Amber, MD;  Location: [facility];  Service: Gynecology;  Laterality: N/A;  . ROBOTIC ASSISTED TOTAL HYSTERECTOMY WITH BILATERAL SALPINGO OOPHERECTOMY Bilateral 06/25/2016   Procedure: XI ROBOTIC ASSISTED TOTAL HYSTERECTOMY WITH BILATERAL SALPINGO OOPHORECTOMY for uterus greater 250 grams ;  Surgeon:  Everitt Amber, MD;  Location: [facility];  Service: Gynecology;  Laterality: Bilateral;  . TONSILLECTOMY    . UMBILICAL HERNIA REPAIR N/A 08/20/2017   Procedure: HERNIA REPAIR UMBILICAL ADULT;  Surgeon: Alphonsa Overall, MD;  Location: [facility];  Service: General;  Laterality: N/A;    Past Medical Hx:  Past Medical History:  Diagnosis Date  . Anemia    prior to hysterectomy  . Ankle fracture   . Anxiety   . Aortic atherosclerosis ([place])   . Cardiomegaly    Mild  . Endometrial cancer ([place])   . Family history of brain cancer   . Family history of breast cancer   . Family history of colon cancer   . Family history of colonic polyps   . Family history of prostate cancer   . History of chemotherapy   . History of gallstones   . History of hiatal hernia   . History of radiation therapy 09/07/16-10/07/16   HDR to vaginal vault 30 gy in 5 fractions  . Hypertension    No BP medications, related more to anxiety  . Inguinal hernia    Right  . Neuropathy    Chemo induce, feet and fingers  . Paraumbilical hernia    Small    Past Gynecological History:  SVD x 2 Patient's last menstrual period was 05/31/2016  (approximate).  Family Hx:  Family History  Problem Relation Age of Onset  . Breast cancer Sister 28  . Colon cancer Paternal Grandfather 70  . Breast cancer Sister 67       Negative genetic testing on a 46 gene panel through [facility]  . Thyroid cancer Sister 71  . Fibroids Mother   . Colon polyps Father   . Healthy Brother   . Breast cancer Maternal Aunt 47  . Prostate cancer Maternal Uncle 68  . Healthy Paternal Aunt   . Heart disease Maternal Grandmother   . Stroke Paternal Grandmother   . Colon polyps Sister   . Colon polyps Sister   . Healthy Sister   . Healthy Brother   . Brain cancer Maternal Aunt 55       died 3 days after diagnosis  . Breast cancer Maternal Aunt 80  . Healthy Paternal 31     Patient's oldest and youngest sister have history of premenopausal breast cancer (she is one of 8 siblings).  Review of Systems:  Constitutional  Feels well,    ENT Normal appearing ears and nares bilaterally Skin/Breast  + female pattern baldness and facial hair.  Cardiovascular  No chest pain, shortness of breath, or edema  Pulmonary  No cough or wheeze.  Gastro Intestinal  No nausea, vomitting, or diarrhoea. No bright red blood per rectum, no abdominal pain, change in bowel movement, or constipation. + flatulence Genito Urinary  No frequency, urgency, dysuria, no vaginal bleeding. Musculo Skeletal  No myalgia, arthralgia, joint swelling or pain  Neurologic  No weakness, numbness, change in gait,  Psychology  No depression, anxiety, insomnia.   labratory assessment: CBC    Component Value Date/Time   WBC 7.6 08/17/2017 0939   RBC 4.49 08/17/2017 0939   HGB 14.4 08/17/2017 0939   HGB 11.5 (L) 11/18/2016 0920   HCT 44.2 08/17/2017 0939   HCT 36.2 11/18/2016 0920   PLT 254 08/17/2017 0939   PLT 156 11/18/2016 0920   MCV 98.4 08/17/2017 0939   MCV 89.4 11/18/2016 0920   MCH 32.1 08/17/2017 0939   MCHC 32.6 08/17/2017 0939  RDW 14.2 08/17/2017 0939   RDW  21.7 (H) 11/18/2016 0920   LYMPHSABS 1.7 07/16/2017 2012   LYMPHSABS 1.4 11/18/2016 0920   MONOABS 0.9 07/16/2017 2012   MONOABS 1.0 (H) 11/18/2016 0920   EOSABS 0.1 07/16/2017 2012   EOSABS 0.0 11/18/2016 0920   BASOSABS 0.0 07/16/2017 2012   BASOSABS 0.0 11/18/2016 0920    Vitals:  Blood pressure 136/67, pulse 76, temperature 98 F (36.7 C), temperature source Oral, resp. rate 20, height 5\' 5"  (1.651 m), weight 247 lb 11.2 oz (112.4 kg), last menstrual period 05/31/2016, SpO2 97 %.  Physical Exam: WD in NAD Neck  Supple NROM, without any enlargements.  Lymph Node Survey No cervical supraclavicular or inguinal adenopathy Cardiovascular  Pulse normal rate, regularity and rhythm. S1 and S2 normal.  Lungs  Clear to auscultation bilateraly, without wheezes/crackles/rhonchi. Good air movement.  Skin  + female pattern baldness, facial hair.  Psychiatry  Alert and oriented to person, place, and time  Abdomen  Normoactive bowel sounds, abdomen soft, non-tender and obese with pannus with candida without vidence of hernia. Incisions well healed. Back No CVA tenderness Genito Urinary  Vulva/vagina: Normal external female genitalia.  No lesions. No discharge or bleeding.  Bladder/urethra:  No lesions or masses, well supported bladder  Vagina: normal, some prolapse. No masses. Vaginal cuff well healed. No blood. No visible lesions  Adnexa: no discretely palpable masses. Rectal  deferred Extremities  No bilateral cyanosis, clubbing or edema.   Thereasa Solo, MD  12/24/2017, 3:32 PM

## 2017-12-24 NOTE — Patient Instructions (Signed)
Please notify Dr Denman George at phone number [phone] if you notice vaginal bleeding, new pelvic or abdominal pains, bloating, feeling full easy, or a change in bladder or bowel function.   Please return to see Dr Sondra Come in March, 2020, and Dr Denman George in June, 2020.  Dr Denman George recommends seeing Dr Renato Shin from Endocrinology at [phone] [facility], [place], [place], [place], [place]).

## 2018-01-28 ENCOUNTER — Ambulatory Visit
Admission: RE | Admit: 2018-01-28 | Discharge: 2018-01-28 | Disposition: A | Discharge status: Home / Self Care | Payer: Managed Care, Other (non HMO) | Source: Ambulatory Visit | Attending: Gynecologic Oncology | Admitting: Gynecologic Oncology

## 2018-01-28 DIAGNOSIS — Z1231 Encounter for screening mammogram for malignant neoplasm of breast: Secondary | ICD-10-CM

## 2018-01-31 ENCOUNTER — Other Ambulatory Visit: Payer: Self-pay | Admitting: Gynecologic Oncology

## 2018-01-31 DIAGNOSIS — R928 Other abnormal and inconclusive findings on diagnostic imaging of breast: Secondary | ICD-10-CM

## 2018-02-25 ENCOUNTER — Ambulatory Visit
Admission: RE | Admit: 2018-02-25 | Discharge: 2018-02-25 | Disposition: A | Discharge status: Home / Self Care | Payer: Managed Care, Other (non HMO) | Source: Ambulatory Visit | Attending: Gynecologic Oncology | Admitting: Gynecologic Oncology

## 2018-02-25 DIAGNOSIS — R928 Other abnormal and inconclusive findings on diagnostic imaging of breast: Secondary | ICD-10-CM

## 2018-03-24 ENCOUNTER — Ambulatory Visit
Admission: RE | Admit: 2018-03-24 | Discharge: 2018-03-24 | Disposition: A | Discharge status: Home / Self Care | Payer: Managed Care, Other (non HMO) | Source: Ambulatory Visit | Attending: Radiation Oncology | Admitting: Radiation Oncology

## 2018-03-24 ENCOUNTER — Other Ambulatory Visit: Payer: Self-pay

## 2018-03-24 ENCOUNTER — Encounter: Payer: Self-pay | Admitting: Radiation Oncology

## 2018-03-24 VITALS — BP 127/72 | HR 60 | Temp 97.9°F | Resp 18 | Ht 65.0 in | Wt 263.0 lb

## 2018-03-24 DIAGNOSIS — R42 Dizziness and giddiness: Secondary | ICD-10-CM | POA: Insufficient documentation

## 2018-03-24 DIAGNOSIS — C541 Malignant neoplasm of endometrium: Secondary | ICD-10-CM

## 2018-03-24 DIAGNOSIS — Z923 Personal history of irradiation: Secondary | ICD-10-CM | POA: Insufficient documentation

## 2018-03-24 DIAGNOSIS — G629 Polyneuropathy, unspecified: Secondary | ICD-10-CM | POA: Diagnosis not present

## 2018-03-24 DIAGNOSIS — Z8542 Personal history of malignant neoplasm of other parts of uterus: Secondary | ICD-10-CM | POA: Insufficient documentation

## 2018-03-24 DIAGNOSIS — Z79899 Other long term (current) drug therapy: Secondary | ICD-10-CM | POA: Diagnosis not present

## 2018-03-24 NOTE — Progress Notes (Signed)
Pt presents today for f/u with Dr. Sondra Come. Pt is accompanied by husband. Pt denies c/o pain other than pt's peripheral neuropathy. Pt denies dysuria/hematuria. Pt denies vaginal bleeding/discharge. Pt denies rectal bleeding, diarrhea/constipation. Pt denies abdominal bloating, N/V.   BP 127/72 (BP Location: Left Arm)   Pulse 60   Temp 97.9 F (36.6 C)   Resp 18   Ht 5\' 5"  (1.651 m)   Wt 263 lb (119.3 kg)   LMP 05/31/2016 (Approximate)   SpO2 99%   BMI 43.77 kg/m   Wt Readings from Last 3 Encounters:  03/24/18 263 lb (119.3 kg)  12/24/17 247 lb 11.2 oz (112.4 kg)  11/12/17 243 lb 8 oz (110.5 kg)   Loma Sousa, RN BSN

## 2018-03-24 NOTE — Progress Notes (Signed)
Radiation Oncology         [phone] ________________________________  Name: Misty [name] MRN: [id number]  Date: 03/24/2018  DOB: March 31, 1960  Follow-Up Visit Note  CC: Patient, No Pcp Per  Everitt Amber, MD    ICD-10-CM   1. Endometrial cancer (HCC) C54.1     Diagnosis:   58 y.o. female with Stage IIIC1 grade 3 endometrioid endometrial cancer   Interval Since Last Radiation:  1 year 6 months   Radiation treatment dates: 09/07/16-10/07/16  Site/dose:  Vaginal vault / 30 Gy in 5 fractions,  6 Gy per treatment  Narrative:  The patient returns today for routine follow-up.  She last saw Dr. Denman George in December with no evidence of active disease.   On review of systems, the patient denies any pain other than peripheral neuropathy. She denies dysuria or hematuria. She denies vaginal bleeding or discharge. She denies diarrhea, constipation, or rectal bleeding. She denies abdominal bloating, nausea, or vomiting. She reports intermittent dizziness, possibly related to gabapentin dose 900 mg TID. She reports balance issues associated with peripheral neuropathy and uses a cane to ambulate.                        ALLERGIES:  has No Known Allergies.  Meds: Current Outpatient Medications  Medication Sig Dispense Refill  . acetaminophen (TYLENOL) 500 MG tablet Take 1,000 mg by mouth every 6 (six) hours as needed for mild pain.     Marland Kitchen ALPRAZolam (XANAX) 0.5 MG tablet Take 0.25-0.5 mg by mouth daily as needed for anxiety (FOR PANIC/ANXIETY).     Marland Kitchen b complex vitamins tablet Take 2 tablets by mouth daily.     . citalopram (CELEXA) 40 MG tablet Take 40 mg daily by mouth.  1  . gabapentin (NEURONTIN) 300 MG capsule TAKE 3 CAPSULES (900 MG TOTAL) BY MOUTH 3 (THREE) TIMES DAILY. 810 capsule 1   No current facility-administered medications for this encounter.     Physical Findings: The patient is in no acute distress. Patient is alert and oriented.  height is 5\' 5"  (1.651 m) and weight is 263  lb (119.3 kg). Her temperature is 97.9 F (36.6 C). Her blood pressure is 127/72 and her pulse is 60. Her respiration is 18 and oxygen saturation is 99%.   Lungs are clear to auscultation bilaterally. Heart has regular rate and rhythm. No palpable cervical, supraclavicular, or axillary adenopathy. Abdomen soft, non-tender, normal bowel sounds.  On pelvic examination the external genitalia were unremarkable. A speculum exam was performed. There are no mucosal lesions noted in the vaginal vault. On bimanual examination there were no pelvic masses appreciated as far as pelvic exam would allow.  Lab Findings: Lab Results  Component Value Date   WBC 7.6 08/17/2017   HGB 14.4 08/17/2017   HCT 44.2 08/17/2017   MCV 98.4 08/17/2017   PLT 254 08/17/2017    Radiographic Findings: US Breast Ltd Uni Right Inc Axilla  Result Date: 02/25/2018 CLINICAL DATA:  Right breast lower inner quadrant possible mass seen on most recent screening mammography. EXAM: DIGITAL DIAGNOSTIC RIGHT MAMMOGRAM WITH CAD AND TOMO ULTRASOUND RIGHT BREAST COMPARISON:  Previous exam(s). ACR Breast Density Category b: There are scattered areas of fibroglandular density. FINDINGS: Additional mammographic views of the right breast demonstrate no suspicious masses, or areas of architectural distortio. The previously questioned asymmetry in the right breast has the appearance of fibroglandular tissue. Mammographic images were processed with CAD. On physical exam, no suspicious  masses are palpated. Targeted ultrasound of the right breast lower inner quadrant is performed, showing no suspicious masses or shadowing lesions. IMPRESSION: No mammographic or sonographic evidence of malignancy in the right breast. RECOMMENDATION: Screening mammogram in one year.(Code:SM-B-01Y) I have discussed the findings and recommendations with the patient. Results were also provided in writing at the conclusion of the visit. If applicable, a reminder letter will  be sent to the patient regarding the next appointment. BI-RADS CATEGORY  1: Negative. Electronically Signed   By: Fidela Salisbury M.D.   On: 02/25/2018 10:01   Mm Diag Breast Tomo Uni Right  Result Date: 02/25/2018 CLINICAL DATA:  Right breast lower inner quadrant possible mass seen on most recent screening mammography. EXAM: DIGITAL DIAGNOSTIC RIGHT MAMMOGRAM WITH CAD AND TOMO ULTRASOUND RIGHT BREAST COMPARISON:  Previous exam(s). ACR Breast Density Category b: There are scattered areas of fibroglandular density. FINDINGS: Additional mammographic views of the right breast demonstrate no suspicious masses, or areas of architectural distortio. The previously questioned asymmetry in the right breast has the appearance of fibroglandular tissue. Mammographic images were processed with CAD. On physical exam, no suspicious masses are palpated. Targeted ultrasound of the right breast lower inner quadrant is performed, showing no suspicious masses or shadowing lesions. IMPRESSION: No mammographic or sonographic evidence of malignancy in the right breast. RECOMMENDATION: Screening mammogram in one year.(Code:SM-B-01Y) I have discussed the findings and recommendations with the patient. Results were also provided in writing at the conclusion of the visit. If applicable, a reminder letter will be sent to the patient regarding the next appointment. BI-RADS CATEGORY  1: Negative. Electronically Signed   By: Fidela Salisbury M.D.   On: 02/25/2018 10:01    Impression:  Stage IIIC1 grade 3 endometrioid endometrial cancer.  No evidence of recurrence on clinical exam.   Plan:  Follow up with Dr. Denman George in 3 months and radiation oncology in 6 months.  ____________________________________  Blair Promise, PhD, MD  This document serves as a record of services personally performed by Gery Pray, MD. It was created on his behalf by Rae Lips, a trained medical scribe. The creation of this record is based on the  scribe's personal observations and the provider's statements to them. This document has been checked and approved by the attending provider.

## 2018-04-26 ENCOUNTER — Encounter: Payer: Self-pay | Admitting: Hematology and Oncology

## 2018-05-12 ENCOUNTER — Telehealth: Payer: Self-pay | Admitting: *Deleted

## 2018-05-12 ENCOUNTER — Telehealth: Payer: Self-pay | Admitting: Neurology

## 2018-05-12 ENCOUNTER — Telehealth: Payer: Self-pay | Admitting: Oncology

## 2018-05-12 NOTE — Telephone Encounter (Signed)
Needs a note for jury duty she does not think she can do it please call

## 2018-05-12 NOTE — Telephone Encounter (Signed)
Patient called and stated "I got a judy summons. See both Dr. Denman George and Dr. Sondra Come now for follow up. I did see Dr. Alvy Bimler for my chemotherapy but I'm finished now. 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 I don't drive yet because of it and I am not steady on my feet. I just don't think I can do it. Can Dr. Denman George write me out of the jury duty, or tell me who to call." Explained that I would give the message to [facility] APP and Dr. Denman George, someone would call her back today or tomorrow.

## 2018-05-12 NOTE — Telephone Encounter (Signed)
Called Misty [name] about [employer].  Advised her that Dr. Denman George would not be able to provide an excuse letter because she is not under active treatment for cancer and that she would need to contact her PCP.  She said that she does not have a PCP and is still seeing Dr. Denman George as a patient.  She also said she would not be able to get to [employer] because of neuropathy in her feet and legs from Chemotherapy.

## 2018-05-12 NOTE — Telephone Encounter (Signed)
I have only assessed her for neuropathy which would not be an appropriate reason for this.  This type of request should go through her primary care provider.

## 2018-05-12 NOTE — Telephone Encounter (Signed)
Please advise 

## 2018-05-13 NOTE — Telephone Encounter (Signed)
Called Daven back and advised her that Dr. Denman George and Dr. Alvy Bimler are not able to provide her with the excuse letter for jury duty because she is not getting active treatment.  She verbalized understanding.

## 2018-05-13 NOTE — Telephone Encounter (Signed)
Patient notified

## 2018-06-29 ENCOUNTER — Inpatient Hospital Stay: Payer: Managed Care, Other (non HMO) | Attending: Gynecologic Oncology | Admitting: Gynecologic Oncology

## 2018-07-18 ENCOUNTER — Telehealth: Payer: Self-pay | Admitting: Plastic Surgery

## 2018-07-18 NOTE — Telephone Encounter (Signed)

## 2018-07-19 ENCOUNTER — Other Ambulatory Visit: Payer: Self-pay

## 2018-07-19 ENCOUNTER — Encounter: Payer: Self-pay | Admitting: Plastic Surgery

## 2018-07-19 ENCOUNTER — Ambulatory Visit (INDEPENDENT_AMBULATORY_CARE_PROVIDER_SITE_OTHER): Payer: Managed Care, Other (non HMO) | Admitting: Plastic Surgery

## 2018-07-19 DIAGNOSIS — M546 Pain in thoracic spine: Secondary | ICD-10-CM | POA: Diagnosis not present

## 2018-07-19 DIAGNOSIS — G8929 Other chronic pain: Secondary | ICD-10-CM

## 2018-07-19 DIAGNOSIS — N62 Hypertrophy of breast: Secondary | ICD-10-CM | POA: Diagnosis not present

## 2018-07-19 DIAGNOSIS — M549 Dorsalgia, unspecified: Secondary | ICD-10-CM | POA: Insufficient documentation

## 2018-07-19 DIAGNOSIS — M542 Cervicalgia: Secondary | ICD-10-CM | POA: Diagnosis not present

## 2018-07-19 NOTE — Progress Notes (Signed)
Patient ID: Misty [name], female    DOB: 1960/03/06, 58 y.o.   MRN: [id number]   Chief Complaint  Patient presents with  . Advice Only    for breast reduction    Mammary Hyperplasia: The patient is a 58 y.o. female with a history of mammary hyperplasia for several years.  She has extremely large breasts causing symptoms that include the following: Back pain (upper and lower) and neck pain. She frequently pins bra cups higher on straps for better lift and relief. Notices relief when holding breast up in her hands. Shoulder straps causing grooves, pain occasionally requiring padding. Pain medication is sometimes required with motrin and tylenol.  Activities that are hindered by enlarged breasts include: walking and exercise of any kind.  Her breasts are extremely large and fairly symmetric.  She has hyperpigmentation of the inframammary area on both sides.  The sternal to nipple distance on the right is 44 cm and the left is 44 cm.  The IMF distance is 25 cm.  She is 5 feet 6 inches tall and weighs 277 pounds.  Preoperative bra size = 44H cup.  The estimated excess breast tissue to be removed at the time of surgery = 1200 grams on the left and 1200 grams on the right.  Mammogram history: Done in January 2020 and clear after U/S.  She underwent chemotherapy for endometrial cancer.  The chemotherapy had an effect on her lower extremities radiating neuropathy.  This in addition to her excess breast weight has required her to use a walker.   Review of Systems  Constitutional: Positive for activity change. Negative for appetite change and diaphoresis.  Eyes: Negative.   Respiratory: Negative.  Negative for chest tightness and shortness of breath.   Cardiovascular: Negative.  Negative for leg swelling.  Gastrointestinal: Negative for abdominal pain.  Endocrine: Negative.   Genitourinary: Negative.   Musculoskeletal: Positive for back pain, gait problem and neck pain.  Hematological:  Negative.   Psychiatric/Behavioral: Negative.     Past Medical History:  Diagnosis Date  . Anemia    prior to hysterectomy  . Ankle fracture   . Anxiety   . Aortic atherosclerosis ([place])   . Cardiomegaly    Mild  . Endometrial cancer ([place])   . Family history of brain cancer   . Family history of breast cancer   . Family history of colon cancer   . Family history of colonic polyps   . Family history of prostate cancer   . History of chemotherapy   . History of gallstones   . History of hiatal hernia   . History of radiation therapy 09/07/16-10/07/16   HDR to vaginal vault 30 gy in 5 fractions  . Hypertension    No BP medications, related more to anxiety  . Inguinal hernia    Right  . Neuropathy    Chemo induce, feet and fingers  . Paraumbilical hernia    Small    Past Surgical History:  Procedure Laterality Date  . ANKLE FRACTURE SURGERY Right   . CHOLECYSTECTOMY    . INGUINAL HERNIA REPAIR Right 08/20/2017   Procedure: OPEN RIGHT INGUINAL HERNIA REPAIR ERA PATHWAY;  Surgeon: Alphonsa Overall, MD;  Location: [facility];  Service: General;  Laterality: Right;  . INSERTION OF MESH Right 08/20/2017   Procedure: INSERTION OF MESH;  Surgeon: Alphonsa Overall, MD;  Location: [facility];  Service: General;  Laterality: Right;  . IR FLUORO GUIDE PORT INSERTION RIGHT  07/31/2016  .  IR REMOVAL TUN ACCESS W/ PORT W/O FL MOD SED  01/13/2017  . IR US GUIDE VASC ACCESS RIGHT  07/31/2016  . LAPAROSCOPY N/A 08/20/2017   Procedure: ABDOMINAL LAPAROSCOPY;  Surgeon: Alphonsa Overall, MD;  Location: [facility];  Service: General;  Laterality: N/A;  . LYMPH NODE BIOPSY N/A 06/25/2016   Procedure: LYMPH NODE BIOPSY;  Surgeon: Everitt Amber, MD;  Location: [facility];  Service: Gynecology;  Laterality: N/A;  . ROBOTIC ASSISTED TOTAL HYSTERECTOMY WITH BILATERAL SALPINGO OOPHERECTOMY Bilateral 06/25/2016   Procedure: XI ROBOTIC ASSISTED TOTAL HYSTERECTOMY WITH BILATERAL SALPINGO OOPHORECTOMY for uterus greater 250 grams ;  Surgeon:  Everitt Amber, MD;  Location: [facility];  Service: Gynecology;  Laterality: Bilateral;  . TONSILLECTOMY    . UMBILICAL HERNIA REPAIR N/A 08/20/2017   Procedure: HERNIA REPAIR UMBILICAL ADULT;  Surgeon: Alphonsa Overall, MD;  Location: [facility];  Service: General;  Laterality: N/A;      Current Outpatient Medications:  .  acetaminophen (TYLENOL) 500 MG tablet, Take 1,000 mg by mouth every 6 (six) hours as needed for mild pain. , Disp: , Rfl:  .  ALPRAZolam (XANAX) 0.5 MG tablet, Take 0.25-0.5 mg by mouth daily as needed for anxiety (FOR PANIC/ANXIETY). , Disp: , Rfl:  .  b complex vitamins tablet, Take 2 tablets by mouth daily. , Disp: , Rfl:  .  citalopram (CELEXA) 40 MG tablet, Take 40 mg daily by mouth., Disp: , Rfl: 1 .  gabapentin (NEURONTIN) 300 MG capsule, TAKE 3 CAPSULES (900 MG TOTAL) BY MOUTH 3 (THREE) TIMES DAILY., Disp: 810 capsule, Rfl: 1   Objective:   Vitals:   07/19/18 1538  BP: (!) 148/87  Pulse: 71  Temp: 98.6 F (37 C)  SpO2: 95%    Physical Exam Vitals signs reviewed.  Constitutional:      Appearance: Normal appearance.  HENT:     Head: Normocephalic and atraumatic.  Cardiovascular:     Rate and Rhythm: Normal rate.     Pulses: Normal pulses.  Pulmonary:     Effort: Pulmonary effort is normal.  Abdominal:     General: Abdomen is flat.  Skin:    General: Skin is warm.  Neurological:     General: No focal deficit present.     Mental Status: She is alert.  Psychiatric:        Mood and Affect: Mood normal.        Behavior: Behavior normal.        Thought Content: Thought content normal.     Assessment & Plan:     ICD-10-CM   1. Symptomatic mammary hypertrophy  N62   2. Neck pain  M54.2   3. Chronic bilateral thoracic back pain  M54.6    G89.29     Recommend bilateral breast reduction.  We discussed amputation technique and inferior pedicle technique. I also requested that she have her previous mammogram results sent to [place]. Pictures were obtained of the  patient and placed in the chart with the patient's or guardian's permission.  [place], DO

## 2018-08-01 ENCOUNTER — Encounter: Payer: Self-pay | Admitting: Family Medicine

## 2018-08-01 ENCOUNTER — Other Ambulatory Visit: Payer: Self-pay

## 2018-08-01 ENCOUNTER — Ambulatory Visit (INDEPENDENT_AMBULATORY_CARE_PROVIDER_SITE_OTHER): Payer: Managed Care, Other (non HMO) | Admitting: Family Medicine

## 2018-08-01 VITALS — BP 144/90 | HR 61 | Temp 98.2°F | Ht 66.0 in | Wt 273.0 lb

## 2018-08-01 DIAGNOSIS — S46819A Strain of other muscles, fascia and tendons at shoulder and upper arm level, unspecified arm, initial encounter: Secondary | ICD-10-CM

## 2018-08-01 DIAGNOSIS — R030 Elevated blood-pressure reading, without diagnosis of hypertension: Secondary | ICD-10-CM | POA: Diagnosis not present

## 2018-08-01 DIAGNOSIS — N62 Hypertrophy of breast: Secondary | ICD-10-CM | POA: Diagnosis not present

## 2018-08-01 NOTE — Progress Notes (Signed)
Chief Complaint  Patient presents with  . New Patient (Initial Visit)    back and neck pain       New Patient Visit SUBJECTIVE: HPI: Misty [name] is an 58 y.o.female who is being seen for establishing care.  She is here with her husband Misty [name].  The patient has not had PCP in several years.   Large breasts Pt has history of large breasts.  Over the past 2 years, as she is continued to gain weight, it became a problem.  She is having upper back/neck pain in addition to low back pain.  She saw a Psychiatric nurse to discuss removal.  She was told for insurance purposes she needs a referral from her PCP.  Patient has a history of whitecoat syndrome.  She was told by her psychiatrist to take Xanax prior to going to her doctor's appointments so she would not have high blood pressure readings.  She does not take any medication for her BP.   No Known Allergies  Past Medical History:  Diagnosis Date  . Anemia    prior to hysterectomy  . Ankle fracture   . Anxiety   . Aortic atherosclerosis ([place])   . Cardiomegaly    Mild  . Endometrial cancer ([place])   . Family history of brain cancer   . Family history of breast cancer   . Family history of colon cancer   . Family history of colonic polyps   . Family history of prostate cancer   . History of chemotherapy   . History of gallstones   . History of hiatal hernia   . History of radiation therapy 09/07/16-10/07/16   HDR to vaginal vault 30 gy in 5 fractions  . Hypertension    No BP medications, related more to anxiety  . Inguinal hernia    Right  . Neuropathy    Chemo induce, feet and fingers  . Paraumbilical hernia    Small   Past Surgical History:  Procedure Laterality Date  . ANKLE FRACTURE SURGERY Right   . CHOLECYSTECTOMY    . INGUINAL HERNIA REPAIR Right 08/20/2017   Procedure: OPEN RIGHT INGUINAL HERNIA REPAIR ERA PATHWAY;  Surgeon: Alphonsa Overall, MD;  [facility]: [facility];  Service: General;  Laterality: Right;  . INSERTION  OF MESH Right 08/20/2017   Procedure: INSERTION OF MESH;  Surgeon: Alphonsa Overall, MD;  [facility];  Service: General;  Laterality: Right;  . IR FLUORO GUIDE PORT INSERTION RIGHT  07/31/2016  . IR REMOVAL TUN ACCESS W/ PORT W/O FL MOD SED  01/13/2017  . IR US GUIDE VASC ACCESS RIGHT  07/31/2016  . LAPAROSCOPY N/A 08/20/2017   Procedure: ABDOMINAL LAPAROSCOPY;  Surgeon: Alphonsa Overall, MD;  [facility];  Service: General;  Laterality: N/A;  . LYMPH NODE BIOPSY N/A 06/25/2016   Procedure: LYMPH NODE BIOPSY;  Surgeon: Everitt Amber, MD;  [facility];  Service: Gynecology;  Laterality: N/A;  . ROBOTIC ASSISTED TOTAL HYSTERECTOMY WITH BILATERAL SALPINGO OOPHERECTOMY Bilateral 06/25/2016   Procedure: XI ROBOTIC ASSISTED TOTAL HYSTERECTOMY WITH BILATERAL SALPINGO OOPHORECTOMY for uterus greater 250 grams ;  Surgeon: Everitt Amber, MD;  Location: [facility];  Service: Gynecology;  Laterality: Bilateral;  . TONSILLECTOMY    . UMBILICAL HERNIA REPAIR N/A 08/20/2017   Procedure: HERNIA REPAIR UMBILICAL ADULT;  Surgeon: Alphonsa Overall, MD;  [facility]: [facility];  Service: General;  Laterality: N/A;   Family History  Problem Relation Age of Onset  . Breast cancer Sister 87  .  Colon cancer Paternal Grandfather 53  . Breast cancer Sister 15       Negative genetic testing on a 46 gene panel through [facility]  . Thyroid cancer Sister 44  . Fibroids Mother   . Colon polyps Father   . Healthy Brother   . Breast cancer Maternal Aunt 65  . Prostate cancer Maternal Uncle 30  . Healthy Paternal Aunt   . Heart disease Maternal Grandmother   . Stroke Paternal Grandmother   . Colon polyps Sister   . Colon polyps Sister   . Healthy Sister   . Healthy Brother   . Brain cancer Maternal Aunt 55       died 3 days after diagnosis  . Breast cancer Maternal Aunt 80  . Healthy Paternal Aunt    No Known Allergies  Current Outpatient Medications:  .  acetaminophen (TYLENOL) 500 MG tablet, Take 1,000 mg by mouth every 6  (six) hours as needed for mild pain. , Disp: , Rfl:  .  b complex vitamins tablet, Take 2 tablets by mouth daily. , Disp: , Rfl:  .  citalopram (CELEXA) 40 MG tablet, Take 40 mg daily by mouth., Disp: , Rfl: 1 .  gabapentin (NEURONTIN) 300 MG capsule, TAKE 3 CAPSULES (900 MG TOTAL) BY MOUTH 3 (THREE) TIMES DAILY., Disp: 810 capsule, Rfl: 1 .  ALPRAZolam (XANAX) 0.5 MG tablet, Take 0.25-0.5 mg by mouth daily as needed for anxiety (FOR PANIC/ANXIETY). , Disp: , Rfl:   ROS Cardiac: Denies chest pain  Respiratory: Denies dyspnea   OBJECTIVE: BP (!) 144/90 (BP Location: Left Arm, Patient Position: Sitting, Cuff Size: Large)   Pulse 61   Temp 98.2 F (36.8 C) (Oral)   Ht 5\' 6"  (1.676 m)   Wt 273 lb (123.8 kg)   LMP 05/31/2016 (Approximate)   SpO2 97%   BMI 44.06 kg/m   Constitutional: -  VS reviewed -  Well developed, well nourished, appears stated age -  No apparent distress  Psychiatric: -  Oriented to person, place, and time -  Memory intact -  Affect and mood normal -  Fluent conversation, good eye contact -  Judgment and insight age appropriate  Eye: -  Conjunctivae clear, no discharge -  Pupils symmetric, round, reactive to light  ENMT: -  MMM    Pharynx moist, no exudate, no erythema  Neck: -  No gross swelling, no palpable masses -  Thyroid midline, not enlarged, mobile, no palpable masses  Cardiovascular: -  RRR -  No bruits -  No LE edema  Respiratory: -  Normal respiratory effort, no accessory muscle use, no retraction -  Breath sounds equal, no wheezes, no ronchi, no crackles  Neurological:  -  CN II - XII grossly intact -  Sensation grossly intact to light touch, equal bilaterally  Musculoskeletal: -  No clubbing, no cyanosis -  Localizes discomfort to b/l traps; no current ttp -  Gait normal  Skin: -  No significant lesion on inspection -  Warm and dry to palpation   ASSESSMENT/PLAN: Large breasts - Plan: Ambulatory referral to Plastic Surgery, patient is  already established, needs referral for insurance purposes  Strain of trapezius muscle, unspecified laterality, initial encounter - Plan: Heat, stretches and exercises provided.  Reduction procedure will probably help the most.  Elevated blood pressure reading - Plan: Monitor home blood pressure readings at home.  Recommended she bring her blood pressure monitor to her next appointment and we will check to verify  validity.  She does not need to take Xanax prior to her appointments with me as we will just have her monitor her home readings.  Patient should return in 1 month for a physical, bring blood pressure monitor and readings. The patient and her husband voiced understanding and agreement to the plan.   [place], DO 08/01/18  3:02 PM

## 2018-08-01 NOTE — Patient Instructions (Addendum)
Heat (pad or rice pillow in microwave) over affected area, 10-15 minutes twice daily.  Check your blood pressure readings at home. Wait 1 minute and check again to see if there is a change.    Vit D at least 800 units daily and 1200 mg of Ca. This is over the counter.  Bring your blood pressure monitor to your next appointment. Don't feel obligated to take Xanax prior to your appointment.   Keep the diet clean and stay active.  Trapezius stretches/exercises Do exercises exactly as told by your health care provider and adjust them as directed. It is normal to feel mild stretching, pulling, tightness, or discomfort as you do these exercises, but you should stop right away if you feel sudden pain or your pain gets worse.  Stretching and range of motion exercises These exercises warm up your muscles and joints and improve the movement and flexibility of your shoulder. These exercises can also help to relieve pain, numbness, and tingling. If you are unable to do any of the following for any reason, do not further attempt to do it.   Exercise A: Flexion, standing    1. Stand and hold a broomstick, a cane, or a similar object. Place your hands a little more than shoulder-width apart on the object. Your left / right hand should be palm-up, and your other hand should be palm-down. 2. Push the stick to raise your left / right arm out to your side and then over your head. Use your other hand to help move the stick. Stop when you feel a stretch in your shoulder, or when you reach the angle that is recommended by your health care provider. ? Avoid shrugging your shoulder while you raise your arm. Keep your shoulder blade tucked down toward your spine. 3. Hold for 30 seconds. 4. Slowly return to the starting position. Repeat 2 times. Complete this exercise 3 times per week.  Exercise B: Abduction, supine    1. Lie on your back and hold a broomstick, a cane, or a similar object. Place your hands a  little more than shoulder-width apart on the object. Your left / right hand should be palm-up, and your other hand should be palm-down. 2. Push the stick to raise your left / right arm out to your side and then over your head. Use your other hand to help move the stick. Stop when you feel a stretch in your shoulder, or when you reach the angle that is recommended by your health care provider. ? Avoid shrugging your shoulder while you raise your arm. Keep your shoulder blade tucked down toward your spine. 3. Hold for 30 seconds. 4. Slowly return to the starting position. Repeat 2 times. Complete this exercise 3 times per week.  Exercise C: Flexion, active-assisted    1. Lie on your back. You may bend your knees for comfort. 2. Hold a broomstick, a cane, or a similar object. Place your hands about shoulder-width apart on the object. Your palms should face toward your feet. 3. Raise the stick and move your arms over your head and behind your head, toward the floor. Use your healthy arm to help your left / right arm move farther. Stop when you feel a gentle stretch in your shoulder, or when you reach the angle where your health care provider tells you to stop. 4. Hold for 30 seconds. 5. Slowly return to the starting position. Repeat 2 times. Complete this exercise 3 times per week.  Exercise  D: External rotation and abduction    1. Stand in a door frame with one of your feet slightly in front of the other. This is called a staggered stance. 2. Choose one of the following positions as told by your health care provider: ? Place your hands and forearms on the door frame above your head. ? Place your hands and forearms on the door frame at the height of your head. ? Place your hands on the door frame at the height of your elbows. 3. Slowly move your weight onto your front foot until you feel a stretch across your chest and in the front of your shoulders. Keep your head and chest upright and keep  your abdominal muscles tight. 4. Hold for 30 seconds. 5. To release the stretch, shift your weight to your back foot. Repeat 2 times. Complete this stretch 3 times per week.  Strengthening exercises These exercises build strength and endurance in your shoulder. Endurance is the ability to use your muscles for a long time, even after your muscles get tired. Exercise E: Scapular depression and adduction  1. Sit on a stable chair. Support your arms in front of you with pillows, armrests, or a tabletop. Keep your elbows in line with the sides of your body. 2. Gently move your shoulder blades down toward your middle back. Relax the muscles on the tops of your shoulders and in the back of your neck. 3. Hold for 3 seconds. 4. Slowly release the tension and relax your muscles completely before doing this exercise again. Repeat for a total of 10 repetitions. 5. After you have practiced this exercise, try doing the exercise without the arm support. Then, try the exercise while standing instead of sitting. Repeat 2 times. Complete this exercise 3 times per week.  Exercise F: Shoulder abduction, isometric    1. Stand or sit about 4-6 inches (10-15 cm) from a wall with your left / right side facing the wall. 2. Bend your left / right elbow and gently press your elbow against the wall. 3. Increase the pressure slowly until you are pressing as hard as you can without shrugging your shoulder. 4. Hold for 3 seconds. 5. Slowly release the tension and relax your muscles completely. Repeat for a total of 10 repetitions. Repeat 2 times. Complete this exercise 3 times per week.  Exercise G: Shoulder flexion, isometric    1. Stand or sit about 4-6 inches (10-15 cm) away from a wall with your left / right side facing the wall. 2. Keep your left / right elbow straight and gently press the top of your fist against the wall. Increase the pressure slowly until you are pressing as hard as you can without shrugging  your shoulder. 3. Hold for 10-15 seconds. 4. Slowly release the tension and relax your muscles completely. Repeat for a total of 10 repetitions. Repeat 2 times. Complete this exercise 3 times per week.  Exercise H: Internal rotation    1. Sit in a stable chair without armrests, or stand. Secure an exercise band at your left / right side, at elbow height. 2. Place a soft object, such as a folded towel or a small pillow, under your left / right upper arm so your elbow is a few inches (about 8 cm) away from your side. 3. Hold the end of the exercise band so the band stretches. 4. Keeping your elbow pressed against the soft object under your arm, move your forearm across your body toward your  abdomen. Keep your body steady so the movement is only coming from your shoulder. 5. Hold for 3 seconds. 6. Slowly return to the starting position. Repeat for a total of 10 repetitions. Repeat 2 times. Complete this exercise 3 times per week.  Exercise I: External rotation    1. Sit in a stable chair without armrests, or stand. 2. Secure an exercise band at your left / right side, at elbow height. 3. Place a soft object, such as a folded towel or a small pillow, under your left / right upper arm so your elbow is a few inches (about 8 cm) away from your side. 4. Hold the end of the exercise band so the band stretches. 5. Keeping your elbow pressed against the soft object under your arm, move your forearm out, away from your abdomen. Keep your body steady so the movement is only coming from your shoulder. 6. Hold for 3 seconds. 7. Slowly return to the starting position. Repeat for a total of 10 repetitions. Repeat 2 times. Complete this exercise 3 times per week. Exercise J: Shoulder extension  1. Sit in a stable chair without armrests, or stand. Secure an exercise band to a stable object in front of you so the band is at shoulder height. 2. Hold one end of the exercise band in each hand. Your palms  should face each other. 3. Straighten your elbows and lift your hands up to shoulder height. 4. Step back, away from the secured end of the exercise band, until the band stretches. 5. Squeeze your shoulder blades together and pull your hands down to the sides of your thighs. Stop when your hands are straight down by your sides. Do not let your hands go behind your body. 6. Hold for 3 seconds. 7. Slowly return to the starting position. Repeat for a total of 10 repetitions. Repeat 2 times. Complete this exercise 3 times per week.  Exercise K: Shoulder extension, prone    1. Lie on your abdomen on a firm surface so your left / right arm hangs over the edge. 2. Hold a 5 lb weight in your hand so your palm faces in toward your body. Your arm should be straight. 3. Squeeze your shoulder blade down toward the middle of your back. 4. Slowly raise your arm behind you, up to the height of the surface that you are lying on. Keep your arm straight. 5. Hold for 3 seconds. 6. Slowly return to the starting position and relax your muscles. Repeat for a total of 10 repetitions. Repeat 2 times. Complete this exercise 3 times per week.   Exercise L: Horizontal abduction, prone  1. Lie on your abdomen on a firm surface so your left / right arm hangs over the edge. 2. Hold a 5 lb weight in your hand so your palm faces toward your feet. Your arm should be straight. 3. Squeeze your shoulder blade down toward the middle of your back. 4. Bend your elbow so your hand moves up, until your elbow is bent to an "L" shape (90 degrees). With your elbow bent, slowly move your forearm forward and up. Raise your hand up to the height of the surface that you are lying on. ? Your upper arm should not move, and your elbow should stay bent. ? At the top of the movement, your palm should face the floor. 5. Hold for 3 seconds. 6. Slowly return to the starting position and relax your muscles. Repeat for a total of  10  repetitions. Repeat 2 times. Complete this exercise 3 times per week.  Exercise M: Horizontal abduction, standing  1. Sit on a stable chair, or stand. 2. Secure an exercise band to a stable object in front of you so the band is at shoulder height. 3. Hold one end of the exercise band in each hand. 4. Straighten your elbows and lift your hands straight in front of you, up to shoulder height. Your palms should face down, toward the floor. 5. Step back, away from the secured end of the exercise band, until the band stretches. 6. Move your arms out to your sides, and keep your arms straight. 7. Hold for 3 seconds. 8. Slowly return to the starting position. Repeat for a total of 10 repetitions. Repeat 2 times. Complete this exercise 3 times per week.  Exercise N: Scapular retraction and elevation  1. Sit on a stable chair, or stand. 2. Secure an exercise band to a stable object in front of you so the band is at shoulder height. 3. Hold one end of the exercise band in each hand. Your palms should face each other. 4. Sit in a stable chair without armrests, or stand. 5. Step back, away from the secured end of the exercise band, until the band stretches. 6. Squeeze your shoulder blades together and lift your hands over your head. Keep your elbows straight. 7. Hold for 3 seconds. 8. Slowly return to the starting position. Repeat for a total of 10 repetitions. Repeat 2 times. Complete this exercise 3 times per week.  This information is not intended to replace advice given to you by your health care provider. Make sure you discuss any questions you have with your health care provider. Document Released: 12/29/2004 Document Revised: 09/05/2015 Document Reviewed: 11/15/2014 Elsevier Interactive Patient Education  2017 [employer].

## 2018-08-23 ENCOUNTER — Encounter: Payer: Self-pay | Admitting: Surgical

## 2018-08-23 ENCOUNTER — Ambulatory Visit (INDEPENDENT_AMBULATORY_CARE_PROVIDER_SITE_OTHER): Payer: Managed Care, Other (non HMO) | Admitting: Surgical

## 2018-08-23 ENCOUNTER — Other Ambulatory Visit: Payer: Self-pay

## 2018-08-23 VITALS — BP 126/86 | HR 78 | Temp 97.3°F | Ht 66.0 in | Wt 272.4 lb

## 2018-08-23 DIAGNOSIS — M542 Cervicalgia: Secondary | ICD-10-CM

## 2018-08-23 DIAGNOSIS — N62 Hypertrophy of breast: Secondary | ICD-10-CM

## 2018-08-23 DIAGNOSIS — G8929 Other chronic pain: Secondary | ICD-10-CM

## 2018-08-23 DIAGNOSIS — M546 Pain in thoracic spine: Secondary | ICD-10-CM

## 2018-08-23 MED ORDER — ONDANSETRON HCL 4 MG PO TABS: 4.0000 mg | ORAL_TABLET | Freq: Three times a day (TID) | ORAL | 0 refills | Status: DC | PRN

## 2018-08-23 MED ORDER — CEPHALEXIN 500 MG PO CAPS: 500.0000 mg | ORAL_CAPSULE | Freq: Two times a day (BID) | ORAL | 0 refills | Status: AC

## 2018-08-23 MED ORDER — HYDROCODONE-ACETAMINOPHEN 5-325 MG PO TABS: 1.0000 | ORAL_TABLET | Freq: Four times a day (QID) | ORAL | 0 refills | Status: AC | PRN

## 2018-08-23 NOTE — H&P (View-Only) (Signed)
Patient ID: Misty [name], female    DOB: 1960-04-12, 58 y.o.   MRN: [id number]  Chief Complaint  Patient presents with  . Pre-op Exam    for (B) breast reduction    History of Present Illness: Misty [name] is a 58 y.o.  female  with a history of mammary hypertrophy.  She presents for preoperative evaluation for upcoming procedure, bilateral breast reduction, scheduled for 09/01/18 with Dr. Marla Roe.  She has a history of endometrial cancer, peripheral neuropathy due to chemotherapy.   Mammogram completed on 01/28/18 - normal left. Right required additional imaging with diagnostic Mammogram and [place] which was completed on 02/25/18. No evidence of malignancy bilaterally.  The sternal to nipple distance on the right is 44 cm and the left is 44 cm.  The IMF distance is 25 cm.  She is 5 feet 6 inches tall and weighs 277 pounds.  Preoperative bra size = 44H cup.  The estimated excess breast tissue to be removed at the time of surgery = 1200 grams on the left and 1200 grams on the right. She would like to be a D/DD cup. She would prefer to wake up being larger than expected.*  Due to her breast size, she has the option of two techniques. She would prefer to have the inferior pedicle technique opposed to the amputation technique*  She does not smoke. No history of diabetes, stroke, MI. No history of DVT/PE. She does not take any blood thinners.   The patient has not had problems with anesthesia.   Generally, she is very healthy. No recent colds or illnesses. She has been quarantining with her husband.   Past Medical History: Allergies: No Known Allergies  Current Medications:  Current Outpatient Medications:  .  acetaminophen (TYLENOL) 500 MG tablet, Take 1,000 mg by mouth every 6 (six) hours as needed for mild pain. , Disp: , Rfl:  .  ALPRAZolam (XANAX) 0.5 MG tablet, Take 0.25-0.5 mg by mouth daily as needed for anxiety (FOR PANIC/ANXIETY). , Disp: , Rfl:  .  b complex  vitamins tablet, Take 2 tablets by mouth daily. , Disp: , Rfl:  .  citalopram (CELEXA) 40 MG tablet, Take 40 mg daily by mouth., Disp: , Rfl: 1 .  gabapentin (NEURONTIN) 300 MG capsule, TAKE 3 CAPSULES (900 MG TOTAL) BY MOUTH 3 (THREE) TIMES DAILY., Disp: 810 capsule, Rfl: 1  Past Medical Problems: Past Medical History:  Diagnosis Date  . Anemia    prior to hysterectomy  . Ankle fracture   . Anxiety   . Aortic atherosclerosis ([place])   . Cardiomegaly    Mild  . Endometrial cancer ([place])   . Family history of brain cancer   . Family history of breast cancer   . Family history of colon cancer   . Family history of colonic polyps   . Family history of prostate cancer   . History of chemotherapy   . History of gallstones   . History of hiatal hernia   . History of radiation therapy 09/07/16-10/07/16   HDR to vaginal vault 30 gy in 5 fractions  . Hypertension    No BP medications, related more to anxiety  . Inguinal hernia    Right  . Neuropathy    Chemo induce, feet and fingers  . Paraumbilical hernia    Small    Past Surgical History: Past Surgical History:  Procedure Laterality Date  . ANKLE FRACTURE SURGERY Right   . CHOLECYSTECTOMY    .  INGUINAL HERNIA REPAIR Right 08/20/2017   Procedure: OPEN RIGHT INGUINAL HERNIA REPAIR ERA PATHWAY;  Surgeon: Alphonsa Overall, MD;  Location: [facility];  Service: General;  Laterality: Right;  . INSERTION OF MESH Right 08/20/2017   Procedure: INSERTION OF MESH;  Surgeon: Alphonsa Overall, MD;  Location: [facility];  Service: General;  Laterality: Right;  . IR FLUORO GUIDE PORT INSERTION RIGHT  07/31/2016  . IR REMOVAL TUN ACCESS W/ PORT W/O FL MOD SED  01/13/2017  . IR US GUIDE VASC ACCESS RIGHT  07/31/2016  . LAPAROSCOPY N/A 08/20/2017   Procedure: ABDOMINAL LAPAROSCOPY;  Surgeon: Alphonsa Overall, MD;  [facility]: [facility];  Service: General;  Laterality: N/A;  . LYMPH NODE BIOPSY N/A 06/25/2016   Procedure: LYMPH NODE BIOPSY;  Surgeon: Everitt Amber, MD;  Location: [facility];  Service: Gynecology;  Laterality: N/A;  . ROBOTIC ASSISTED TOTAL HYSTERECTOMY WITH BILATERAL SALPINGO OOPHERECTOMY Bilateral 06/25/2016   Procedure: XI ROBOTIC ASSISTED TOTAL HYSTERECTOMY WITH BILATERAL SALPINGO OOPHORECTOMY for uterus greater 250 grams ;  Surgeon: Everitt Amber, MD;  Location: [facility];  Service: Gynecology;  Laterality: Bilateral;  . TONSILLECTOMY    . UMBILICAL HERNIA REPAIR N/A 08/20/2017   Procedure: HERNIA REPAIR UMBILICAL ADULT;  Surgeon: Alphonsa Overall, MD;  Location: [facility];  Service: General;  Laterality: N/A;    Social History: Social History   Socioeconomic History  . Marital status: Married    Spouse name: Misty [name]  . Number of children: 2  . Years of education: Not on file  . Highest education level: Professional school degree (e.g., MD, DDS, DVM, JD)  Occupational History  . Occupation: retired  Scientific laboratory technician  . Financial resource strain: Not on file  . Food insecurity    Worry: Not on file    Inability: Not on file  . Transportation needs    Medical: Not on file    Non-medical: Not on file  Tobacco Use  . Smoking status: Never Smoker  . Smokeless tobacco: Never Used  Substance and Sexual Activity  . Alcohol use: Not Currently    Alcohol/week: 1.0 standard drinks    Types: 1 Glasses of wine per week    Frequency: Never  . Drug use: No  . Sexual activity: Not on file  Lifestyle  . Physical activity    Days per week: Not on file    Minutes per session: Not on file  . Stress: Not on file  Relationships  . Social Herbalist on phone: Not on file    Gets together: Not on file    Attends religious service: Not on file    Active member of club or organization: Not on file    Attends meetings of clubs or organizations: Not on file    Relationship status: Not on file  . Intimate partner violence    Fear of current or ex partner: Not on file    Emotionally abused: Not on file    Physically abused: Not on file    Forced sexual activity:  Not on file  Other Topics Concern  . Not on file  Social History Narrative   Lives with husband in a 2 story home.  Has 2 sons.  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     Family History: Family History  Problem Relation Age of Onset  . Breast cancer Sister 88  . Colon cancer Paternal Grandfather 26  . Breast cancer Sister 37  Negative genetic testing on a 46 gene panel through [facility]  . Thyroid cancer Sister 73  . Fibroids Mother   . Colon polyps Father   . Healthy Brother   . Breast cancer Maternal Aunt 25  . Prostate cancer Maternal Uncle 73  . Healthy Paternal Aunt   . Heart disease Maternal Grandmother   . Stroke Paternal Grandmother   . Colon polyps Sister   . Colon polyps Sister   . Healthy Sister   . Healthy Brother   . Brain cancer Maternal Aunt 55       died 3 days after diagnosis  . Breast cancer Maternal Aunt 80  . Healthy Paternal Aunt     Review of Systems: Review of Systems  Constitutional: Negative for chills, diaphoresis and fever.  Eyes: Negative.   Respiratory: Negative.   Cardiovascular: Negative.   Genitourinary: Negative.   Musculoskeletal: Positive for back pain and neck pain.  Skin: Negative for itching and rash.  Neurological: Negative.   Psychiatric/Behavioral: Negative.     Physical Exam: Vital Signs BP 126/86 (BP Location: Left Arm, Patient Position: Sitting, Cuff Size: Large)   Pulse 78   Temp (!) 97.3 F (36.3 C) (Temporal)   Ht 5\' 6"  (1.676 m)   Wt 272 lb 6.4 oz (123.6 kg)   LMP 05/31/2016 (Approximate)   SpO2 98%   BMI 43.97 kg/m  Nursing note and vital signs reviewed.  Physical Exam Exam conducted with a chaperone present.  Constitutional:      General: She is not in acute distress.    Appearance: Normal appearance. She is not ill-appearing.  HENT:     Head: Normocephalic and atraumatic.  Eyes:     Pupils: Pupils are equal, round Neck:     Musculoskeletal: Normal range of motion.   Cardiovascular:     Rate and Rhythm: Normal rate and regular rhythm.     Pulses: Normal pulses.     Heart sounds: Normal heart sounds. No murmur.  Pulmonary:     Effort: Pulmonary effort is normal. No respiratory distress.     Breath sounds: Normal breath sounds. No wheezing.  Abdominal:     General: Abdomen is flat. There is no distension.     Palpations: Abdomen is soft.     Tenderness: There is no abdominal tenderness.  Musculoskeletal: Normal range of motion.  Skin:    General: Skin is warm and dry.     Findings: No erythema or rash.  Neurological:     General: No focal deficit present.     Mental Status: She is alert and oriented to person, place, and time. Mental status is at baseline.     Motor: No weakness.  Psychiatric:        Mood and Affect: Mood normal.        Behavior: Behavior normal.    Assessment/Plan: Misty [name] is scheduled for Bilateral breast reduction with Dr. Marla Roe on 09/01/18.  Risks, benefits, and alternatives of procedure discussed, questions answered and consent obtained.    She is generally healthy. No history of any surgical complications. She does well with anesthesia.  No history of anesthesia complications.  Recent mammogram within 6-7 months - normal.  The risk that can be encountered with breast reduction were discussed and include the following but not limited to these:  Breast asymmetry, fluid accumulation, firmness of the breast, inability to breast feed, loss of nipple or areola, skin loss, decrease or no nipple sensation, fat necrosis of the breast  tissue, bleeding, infection, healing delay.  There are risks of anesthesia, changes to skin sensation and injury to nerves or blood vessels.  The muscle can be temporarily or permanently injured.  You may have an allergic reaction to tape, suture, glue, blood products which can result in skin discoloration, swelling, pain, skin lesions, poor healing.  Any of these can lead to the need for  revisonal surgery or stage procedures.  A reduction has potential to interfere with diagnostic procedures.  Nipple or breast piercing can increase risks of infection.  This procedure is best done when the breast is fully developed.  Changes in the breast will continue to occur over time.  Pregnancy can alter the outcomes of previous breast reduction surgery, weight gain and weigh loss can also effect the long term appearance.   Electronically signed by: Carola Rhine Paiten Boies, PA-C 08/23/2018 2:19 PM

## 2018-08-23 NOTE — Progress Notes (Addendum)
Patient ID: Misty [name], female    DOB: 07-22-1960, 58 y.o.   MRN: [id number]  Chief Complaint  Patient presents with  . Pre-op Exam    for (B) breast reduction    History of Present Illness: Misty [name] is a 58 y.o.  female  with a history of mammary hypertrophy.  She presents for preoperative evaluation for upcoming procedure, bilateral breast reduction, scheduled for 09/01/18 with Dr. Marla Roe.  She has a history of endometrial cancer, peripheral neuropathy due to chemotherapy.   Mammogram completed on 01/28/18 - normal left. Right required additional imaging with diagnostic Mammogram and [place] which was completed on 02/25/18. No evidence of malignancy bilaterally.  The sternal to nipple distance on the right is 44 cm and the left is 44 cm.  The IMF distance is 25 cm.  She is 5 feet 6 inches tall and weighs 277 pounds.  Preoperative bra size = 44H cup.  The estimated excess breast tissue to be removed at the time of surgery = 1200 grams on the left and 1200 grams on the right. She would like to be a D/DD cup. She would prefer to wake up being larger than expected.*  Due to her breast size, she has the option of two techniques. She would prefer to have the inferior pedicle technique opposed to the amputation technique*  She does not smoke. No history of diabetes, stroke, MI. No history of DVT/PE. She does not take any blood thinners.   The patient has not had problems with anesthesia.   Generally, she is very healthy. No recent colds or illnesses. She has been quarantining with her husband.   Past Medical History: Allergies: No Known Allergies  Current Medications:  Current Outpatient Medications:  .  acetaminophen (TYLENOL) 500 MG tablet, Take 1,000 mg by mouth every 6 (six) hours as needed for mild pain. , Disp: , Rfl:  .  ALPRAZolam (XANAX) 0.5 MG tablet, Take 0.25-0.5 mg by mouth daily as needed for anxiety (FOR PANIC/ANXIETY). , Disp: , Rfl:  .  b complex  vitamins tablet, Take 2 tablets by mouth daily. , Disp: , Rfl:  .  citalopram (CELEXA) 40 MG tablet, Take 40 mg daily by mouth., Disp: , Rfl: 1 .  gabapentin (NEURONTIN) 300 MG capsule, TAKE 3 CAPSULES (900 MG TOTAL) BY MOUTH 3 (THREE) TIMES DAILY., Disp: 810 capsule, Rfl: 1  Past Medical Problems: Past Medical History:  Diagnosis Date  . Anemia    prior to hysterectomy  . Ankle fracture   . Anxiety   . Aortic atherosclerosis ([place])   . Cardiomegaly    Mild  . Endometrial cancer ([place])   . Family history of brain cancer   . Family history of breast cancer   . Family history of colon cancer   . Family history of colonic polyps   . Family history of prostate cancer   . History of chemotherapy   . History of gallstones   . History of hiatal hernia   . History of radiation therapy 09/07/16-10/07/16   HDR to vaginal vault 30 gy in 5 fractions  . Hypertension    No BP medications, related more to anxiety  . Inguinal hernia    Right  . Neuropathy    Chemo induce, feet and fingers  . Paraumbilical hernia    Small    Past Surgical History: Past Surgical History:  Procedure Laterality Date  . ANKLE FRACTURE SURGERY Right   . CHOLECYSTECTOMY    .  INGUINAL HERNIA REPAIR Right 08/20/2017   Procedure: OPEN RIGHT INGUINAL HERNIA REPAIR ERA PATHWAY;  Surgeon: Alphonsa Overall, MD;  [facility];  Service: General;  Laterality: Right;  . INSERTION OF MESH Right 08/20/2017   Procedure: INSERTION OF MESH;  Surgeon: Alphonsa Overall, MD;  [facility];  Service: General;  Laterality: Right;  . IR FLUORO GUIDE PORT INSERTION RIGHT  07/31/2016  . IR REMOVAL TUN ACCESS W/ PORT W/O FL MOD SED  01/13/2017  . IR US GUIDE VASC ACCESS RIGHT  07/31/2016  . LAPAROSCOPY N/A 08/20/2017   Procedure: ABDOMINAL LAPAROSCOPY;  Surgeon: Alphonsa Overall, MD;  [facility];  Service: General;  Laterality: N/A;  . LYMPH NODE BIOPSY N/A 06/25/2016   Procedure: LYMPH NODE BIOPSY;  Surgeon: Everitt Amber, MD;  [facility];  Service: Gynecology;  Laterality: N/A;  . ROBOTIC ASSISTED TOTAL HYSTERECTOMY WITH BILATERAL SALPINGO OOPHERECTOMY Bilateral 06/25/2016   Procedure: XI ROBOTIC ASSISTED TOTAL HYSTERECTOMY WITH BILATERAL SALPINGO OOPHORECTOMY for uterus greater 250 grams ;  Surgeon: Everitt Amber, MD;  [facility];  Service: Gynecology;  Laterality: Bilateral;  . TONSILLECTOMY    . UMBILICAL HERNIA REPAIR N/A 08/20/2017   Procedure: HERNIA REPAIR UMBILICAL ADULT;  Surgeon: Alphonsa Overall, MD;  [facility];  Service: General;  Laterality: N/A;    Social History: Social History   Socioeconomic History  . Marital status: Married    Spouse name: [name]  . Number of children: 2  . Years of education: Not on file  . Highest education level: Professional school degree (e.g., MD, DDS, DVM, JD)  Occupational History  . Occupation: retired  Scientific laboratory technician  . Financial resource strain: Not on file  . Food insecurity    Worry: Not on file    Inability: Not on file  . Transportation needs    Medical: Not on file    Non-medical: Not on file  Tobacco Use  . Smoking status: Never Smoker  . Smokeless tobacco: Never Used  Substance and Sexual Activity  . Alcohol use: Not Currently    Alcohol/week: 1.0 standard drinks    Types: 1 Glasses of wine per week    Frequency: Never  . Drug use: No  . Sexual activity: Not on file  Lifestyle  . Physical activity    Days per week: Not on file    Minutes per session: Not on file  . Stress: Not on file  Relationships  . Social Herbalist on phone: Not on file    Gets together: Not on file    Attends religious service: Not on file    Active member of club or organization: Not on file    Attends meetings of clubs or organizations: Not on file    Relationship status: Not on file  . Intimate partner violence    Fear of current or ex partner: Not on file    Emotionally abused: Not on file    Physically abused: Not on file    Forced sexual activity:  Not on file  Other Topics Concern  . Not on file  Social History Narrative   Lives with husband in a 2 story home.  Has 2 sons.  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     Family History: Family History  Problem Relation Age of Onset  . Breast cancer Sister 85  . Colon cancer Paternal Grandfather 78  . Breast cancer Sister 5  Negative genetic testing on a 46 gene panel through [facility]  . Thyroid cancer Sister 44  . Fibroids Mother   . Colon polyps Father   . Healthy Brother   . Breast cancer Maternal Aunt 63  . Prostate cancer Maternal Uncle 93  . Healthy Paternal Aunt   . Heart disease Maternal Grandmother   . Stroke Paternal Grandmother   . Colon polyps Sister   . Colon polyps Sister   . Healthy Sister   . Healthy Brother   . Brain cancer Maternal Aunt 55       died 3 days after diagnosis  . Breast cancer Maternal Aunt 80  . Healthy Paternal Aunt     Review of Systems: Review of Systems  Constitutional: Negative for chills, diaphoresis and fever.  Eyes: Negative.   Respiratory: Negative.   Cardiovascular: Negative.   Genitourinary: Negative.   Musculoskeletal: Positive for back pain and neck pain.  Skin: Negative for itching and rash.  Neurological: Negative.   Psychiatric/Behavioral: Negative.     Physical Exam: Vital Signs BP 126/86 (BP Location: Left Arm, Patient Position: Sitting, Cuff Size: Large)   Pulse 78   Temp (!) 97.3 F (36.3 C) (Temporal)   Ht 5\' 6"  (1.676 m)   Wt 272 lb 6.4 oz (123.6 kg)   LMP 05/31/2016 (Approximate)   SpO2 98%   BMI 43.97 kg/m  Nursing note and vital signs reviewed.  Physical Exam Exam conducted with a chaperone present.  Constitutional:      General: She is not in acute distress.    Appearance: Normal appearance. She is not ill-appearing.  HENT:     Head: Normocephalic and atraumatic.  Eyes:     Pupils: Pupils are equal, round Neck:     Musculoskeletal: Normal range of motion.   Cardiovascular:     Rate and Rhythm: Normal rate and regular rhythm.     Pulses: Normal pulses.     Heart sounds: Normal heart sounds. No murmur.  Pulmonary:     Effort: Pulmonary effort is normal. No respiratory distress.     Breath sounds: Normal breath sounds. No wheezing.  Abdominal:     General: Abdomen is flat. There is no distension.     Palpations: Abdomen is soft.     Tenderness: There is no abdominal tenderness.  Musculoskeletal: Normal range of motion.  Skin:    General: Skin is warm and dry.     Findings: No erythema or rash.  Neurological:     General: No focal deficit present.     Mental Status: She is alert and oriented to person, place, and time. Mental status is at baseline.     Motor: No weakness.  Psychiatric:        Mood and Affect: Mood normal.        Behavior: Behavior normal.    Assessment/Plan: Misty [name] is scheduled for Bilateral breast reduction with Dr. Marla Roe on 09/01/18.  Risks, benefits, and alternatives of procedure discussed, questions answered and consent obtained.    She is generally healthy. No history of any surgical complications. She does well with anesthesia.  No history of anesthesia complications.  Recent mammogram within 6-7 months - normal.  The risk that can be encountered with breast reduction were discussed and include the following but not limited to these:  Breast asymmetry, fluid accumulation, firmness of the breast, inability to breast feed, loss of nipple or areola, skin loss, decrease or no nipple sensation, fat necrosis of the breast  tissue, bleeding, infection, healing delay.  There are risks of anesthesia, changes to skin sensation and injury to nerves or blood vessels.  The muscle can be temporarily or permanently injured.  You may have an allergic reaction to tape, suture, glue, blood products which can result in skin discoloration, swelling, pain, skin lesions, poor healing.  Any of these can lead to the need for  revisonal surgery or stage procedures.  A reduction has potential to interfere with diagnostic procedures.  Nipple or breast piercing can increase risks of infection.  This procedure is best done when the breast is fully developed.  Changes in the breast will continue to occur over time.  Pregnancy can alter the outcomes of previous breast reduction surgery, weight gain and weigh loss can also effect the long term appearance.   Electronically signed by: Carola Rhine Sola Margolis, PA-C 08/23/2018 2:19 PM

## 2018-08-29 ENCOUNTER — Other Ambulatory Visit (HOSPITAL_COMMUNITY)
Admission: RE | Admit: 2018-08-29 | Discharge: 2018-08-29 | Disposition: A | Discharge status: Home / Self Care | Payer: Managed Care, Other (non HMO) | Source: Ambulatory Visit | Attending: Plastic Surgery | Admitting: Plastic Surgery

## 2018-08-29 DIAGNOSIS — Z20828 Contact with and (suspected) exposure to other viral communicable diseases: Secondary | ICD-10-CM | POA: Insufficient documentation

## 2018-08-29 DIAGNOSIS — Z01812 Encounter for preprocedural laboratory examination: Secondary | ICD-10-CM | POA: Insufficient documentation

## 2018-08-29 DIAGNOSIS — N62 Hypertrophy of breast: Secondary | ICD-10-CM | POA: Insufficient documentation

## 2018-08-29 LAB — SARS CORONAVIRUS 2 (TAT 6-24 HRS): SARS Coronavirus 2: NEGATIVE

## 2018-08-31 ENCOUNTER — Other Ambulatory Visit: Payer: Self-pay

## 2018-08-31 ENCOUNTER — Encounter (HOSPITAL_COMMUNITY): Payer: Self-pay | Admitting: *Deleted

## 2018-08-31 MED ORDER — DEXTROSE 5 % IV SOLN
3.0000 g | INTRAVENOUS | Status: AC
  Administered 2018-09-01: 3 g via INTRAVENOUS
  Administered 2018-09-01: 2 g via INTRAVENOUS
  Filled 2018-08-31: qty 3

## 2018-08-31 NOTE — Anesthesia Preprocedure Evaluation (Addendum)
Anesthesia Evaluation  Patient identified by MRN, date of birth, ID band Patient awake    Reviewed: Allergy & Precautions, H&P , NPO status , Patient's Chart, lab work & pertinent test results  Airway Mallampati: III  TM Distance: >3 FB Neck ROM: Full    Dental no notable dental hx. (+) Teeth Intact, Dental Advisory Given   Pulmonary neg pulmonary ROS,    Pulmonary exam normal breath sounds clear to auscultation       Cardiovascular hypertension,  Rhythm:Regular Rate:Normal     Neuro/Psych Anxiety negative neurological ROS     GI/Hepatic negative GI ROS, Neg liver ROS,   Endo/Other  Morbid obesity  Renal/GU negative Renal ROS  negative genitourinary   Musculoskeletal   Abdominal   Peds  Hematology  (+) Blood dyscrasia, anemia ,   Anesthesia Other Findings   Reproductive/Obstetrics negative OB ROS                           Anesthesia Physical Anesthesia Plan  ASA: III  Anesthesia Plan: General   Post-op Pain Management:    Induction: Intravenous  PONV Risk Score and Plan: 4 or greater and Ondansetron, Dexamethasone and Midazolam  Airway Management Planned: Oral ETT  Additional Equipment:   Intra-op Plan:   Post-operative Plan: Extubation in OR  Informed Consent: I have reviewed the patients History and Physical, chart, labs and discussed the procedure including the risks, benefits and alternatives for the proposed anesthesia with the patient or authorized representative who has indicated his/her understanding and acceptance.     Dental advisory given  Plan Discussed with: CRNA  Anesthesia Plan Comments: (PAT note written 08/31/2018 by Myra Gianotti, PA-C. SAME DAY WORK-UP   )       Anesthesia Quick Evaluation

## 2018-08-31 NOTE — Progress Notes (Signed)
Anesthesia Chart Review: SAME DAY WORK-UP   Case: [id number] Date/Time: 09/01/18 1025   Procedure: MAMMARY REDUCTION  (BREAST) (Bilateral Breast) - 4 hours, please   Anesthesia type: General   Pre-op diagnosis: mammary hypertrophy   Location: [facility] OR ROOM 09 / [facility]   Surgeon: Wallace Going, DO      DISCUSSION: Patient is a 58 year old female scheduled for the above procedure.  History includes never smoker, HTN ("whitecoat syndrome", no BP meds), endometrial cancer (s/p hysterectomy/BSO 06/25/16, s/p vaginal brachytherapy 09/07/16-10/07/16, completed chemotherapy 11/19/16), chemo-induced neuropathy, hiatal hernia, right inguinal hernia (s/p right IHR 08/20/17), aortic atherosclerosis (with normal heart size 06/2016 CT), obesity.  She is a same day work-up, so labs and EKG per anesthesia. 08/29/18 presurgical COVID-19 test negative.   VS: Ht 5\' 5"  (1.651 m)   Wt 123.6 kg   LMP 05/31/2016 (Approximate)   BMI 45.34 kg/m   PROVIDERS: Shelda Pal, DO is PCP. Established 08/01/18. Heath Lark, MD is Johnny Bridge, MD is [facility] Narda Amber, DO is neurologist   LABS: For day of surgery.    EKG: Last EKG seen 06/23/16, SB, cannot rule out anterior infarct.   CV: N/A   Past Medical History:  Diagnosis Date  . Ambulates with cane   . Anemia    prior to hysterectomy  . Ankle fracture   . Anxiety   . Aortic atherosclerosis ([place])   . Cardiomegaly    Mild  . Endometrial cancer ([place])   . Family history of brain cancer   . Family history of breast cancer   . Family history of colon cancer   . Family history of colonic polyps   . Family history of prostate cancer   . History of chemotherapy   . History of gallstones   . History of hiatal hernia   . History of radiation therapy 09/07/16-10/07/16   HDR to vaginal vault 30 gy in 5 fractions  . Hypertension    No BP medications, related more to anxiety  . Inguinal hernia    Right  . Neuropathy    Chemo induce,  feet and fingers  . Paraumbilical hernia    Small    Past Surgical History:  Procedure Laterality Date  . ANKLE FRACTURE SURGERY Right   . CHOLECYSTECTOMY    . INGUINAL HERNIA REPAIR Right 08/20/2017   Procedure: OPEN RIGHT INGUINAL HERNIA REPAIR ERA PATHWAY;  Surgeon: Alphonsa Overall, MD;  Location: [facility]S;  Service: General;  Laterality: Right;  . INSERTION OF MESH Right 08/20/2017   Procedure: INSERTION OF MESH;  Surgeon: Alphonsa Overall, MD;  Location: [facility];  Service: General;  Laterality: Right;  . IR FLUORO GUIDE PORT INSERTION RIGHT  07/31/2016  . IR REMOVAL TUN ACCESS W/ PORT W/O FL MOD SED  01/13/2017  . IR US GUIDE VASC ACCESS RIGHT  07/31/2016  . LAPAROSCOPY N/A 08/20/2017   Procedure: ABDOMINAL LAPAROSCOPY;  Surgeon: Alphonsa Overall, MD;  Location: [facility];  Service: General;  Laterality: N/A;  . LYMPH NODE BIOPSY N/A 06/25/2016   Procedure: LYMPH NODE BIOPSY;  Surgeon: Everitt Amber, MD;  Location: [facility];  Service: Gynecology;  Laterality: N/A;  . ROBOTIC ASSISTED TOTAL HYSTERECTOMY WITH BILATERAL SALPINGO OOPHERECTOMY Bilateral 06/25/2016   Procedure: XI ROBOTIC ASSISTED TOTAL HYSTERECTOMY WITH BILATERAL SALPINGO OOPHORECTOMY for uterus greater 250 grams ;  Surgeon: Everitt Amber, MD;  Location: [facility];  Service: Gynecology;  Laterality: Bilateral;  . TONSILLECTOMY    . UMBILICAL HERNIA REPAIR N/A 08/20/2017  Procedure: HERNIA REPAIR UMBILICAL ADULT;  Surgeon: Alphonsa Overall, MD;  Location: [facility];  Service: General;  Laterality: N/A;    MEDICATIONS: . [START ON 09/01/2018] ceFAZolin (ANCEF) 3 g in dextrose 5 % 50 mL IVPB   . acetaminophen (TYLENOL) 500 MG tablet  . ALPRAZolam (XANAX) 0.5 MG tablet  . citalopram (CELEXA) 40 MG tablet  . gabapentin (NEURONTIN) 300 MG capsule  . b complex vitamins tablet  . Multiple Vitamin (MULTIVITAMIN WITH MINERALS) TABS tablet  . ondansetron (ZOFRAN) 4 MG tablet    Myra Gianotti, PA-C Surgical Short Stay/Anesthesiology [facility] Phone [phone] [facility] Phone [phone] 08/31/2018 4:44 PM

## 2018-08-31 NOTE — Progress Notes (Signed)
Patient denies shortness of breath, fever, cough and chest pain.  PCP - Riki Sheer, DO Cardiologist - Denies  Chest x-ray - Denies EKG - 09/01/18 Stress Test - Denies ECHO - Denies Cardiac Cath - Denies  Anesthesia review: Yes, Ebony Hail, PA   STOP now taking any Aspirin (unless otherwise instructed by your surgeon), Aleve, Naproxen, Ibuprofen, Motrin, Advil, Goody's, BC's, all herbal medications, fish oil, and all vitamins.   Coronavirus Screening Have you or your husband experienced the following symptoms:  Cough yes/no: No Fever (>100.63F)  yes/no: No Runny nose yes/no: No Sore throat yes/no: No Difficulty breathing/shortness of breath  yes/no: No  Have you or your husband traveled in the last 14 days and where? yes/no: No

## 2018-09-01 ENCOUNTER — Ambulatory Visit (HOSPITAL_COMMUNITY): Payer: Managed Care, Other (non HMO) | Admitting: Vascular Surgery

## 2018-09-01 ENCOUNTER — Encounter (HOSPITAL_COMMUNITY)
Admission: RE | Disposition: A | Discharge status: Home / Self Care | Payer: Self-pay | Source: Home / Self Care | Attending: Plastic Surgery

## 2018-09-01 ENCOUNTER — Encounter (HOSPITAL_COMMUNITY): Payer: Self-pay | Admitting: Surgery

## 2018-09-01 ENCOUNTER — Ambulatory Visit (HOSPITAL_COMMUNITY)
Admission: RE | Admit: 2018-09-01 | Discharge: 2018-09-01 | Disposition: A | Discharge status: Home / Self Care | Payer: Managed Care, Other (non HMO) | Source: Home / Self Care | Attending: Plastic Surgery | Admitting: Plastic Surgery

## 2018-09-01 DIAGNOSIS — M542 Cervicalgia: Secondary | ICD-10-CM | POA: Diagnosis not present

## 2018-09-01 DIAGNOSIS — T451X5A Adverse effect of antineoplastic and immunosuppressive drugs, initial encounter: Secondary | ICD-10-CM | POA: Diagnosis present

## 2018-09-01 DIAGNOSIS — Y838 Other surgical procedures as the cause of abnormal reaction of the patient, or of later complication, without mention of misadventure at the time of the procedure: Secondary | ICD-10-CM | POA: Diagnosis not present

## 2018-09-01 DIAGNOSIS — F329 Major depressive disorder, single episode, unspecified: Secondary | ICD-10-CM | POA: Diagnosis present

## 2018-09-01 DIAGNOSIS — Z6841 Body Mass Index (BMI) 40.0 and over, adult: Secondary | ICD-10-CM | POA: Insufficient documentation

## 2018-09-01 DIAGNOSIS — N62 Hypertrophy of breast: Secondary | ICD-10-CM | POA: Insufficient documentation

## 2018-09-01 DIAGNOSIS — F419 Anxiety disorder, unspecified: Secondary | ICD-10-CM | POA: Insufficient documentation

## 2018-09-01 DIAGNOSIS — Z8542 Personal history of malignant neoplasm of other parts of uterus: Secondary | ICD-10-CM

## 2018-09-01 DIAGNOSIS — Y95 Nosocomial condition: Secondary | ICD-10-CM | POA: Diagnosis present

## 2018-09-01 DIAGNOSIS — R296 Repeated falls: Secondary | ICD-10-CM | POA: Diagnosis present

## 2018-09-01 DIAGNOSIS — E43 Unspecified severe protein-calorie malnutrition: Secondary | ICD-10-CM | POA: Diagnosis present

## 2018-09-01 DIAGNOSIS — J9601 Acute respiratory failure with hypoxia: Secondary | ICD-10-CM | POA: Diagnosis present

## 2018-09-01 DIAGNOSIS — Z8042 Family history of malignant neoplasm of prostate: Secondary | ICD-10-CM

## 2018-09-01 DIAGNOSIS — G620 Drug-induced polyneuropathy: Secondary | ICD-10-CM | POA: Insufficient documentation

## 2018-09-01 DIAGNOSIS — I10 Essential (primary) hypertension: Secondary | ICD-10-CM | POA: Diagnosis present

## 2018-09-01 DIAGNOSIS — I700 Atherosclerosis of aorta: Secondary | ICD-10-CM | POA: Diagnosis present

## 2018-09-01 DIAGNOSIS — Z9221 Personal history of antineoplastic chemotherapy: Secondary | ICD-10-CM

## 2018-09-01 DIAGNOSIS — Z808 Family history of malignant neoplasm of other organs or systems: Secondary | ICD-10-CM

## 2018-09-01 DIAGNOSIS — Z9049 Acquired absence of other specified parts of digestive tract: Secondary | ICD-10-CM

## 2018-09-01 DIAGNOSIS — Z923 Personal history of irradiation: Secondary | ICD-10-CM

## 2018-09-01 DIAGNOSIS — M549 Dorsalgia, unspecified: Secondary | ICD-10-CM | POA: Diagnosis not present

## 2018-09-01 DIAGNOSIS — J954 Chemical pneumonitis due to anesthesia: Principal | ICD-10-CM | POA: Diagnosis present

## 2018-09-01 DIAGNOSIS — D62 Acute posthemorrhagic anemia: Secondary | ICD-10-CM | POA: Diagnosis present

## 2018-09-01 DIAGNOSIS — Z79899 Other long term (current) drug therapy: Secondary | ICD-10-CM | POA: Insufficient documentation

## 2018-09-01 DIAGNOSIS — Z20828 Contact with and (suspected) exposure to other viral communicable diseases: Secondary | ICD-10-CM | POA: Diagnosis present

## 2018-09-01 DIAGNOSIS — K5900 Constipation, unspecified: Secondary | ICD-10-CM | POA: Diagnosis not present

## 2018-09-01 DIAGNOSIS — K219 Gastro-esophageal reflux disease without esophagitis: Secondary | ICD-10-CM | POA: Diagnosis present

## 2018-09-01 DIAGNOSIS — Z800 Family history of malignant neoplasm of digestive organs: Secondary | ICD-10-CM

## 2018-09-01 DIAGNOSIS — Z803 Family history of malignant neoplasm of breast: Secondary | ICD-10-CM

## 2018-09-01 DIAGNOSIS — Z90710 Acquired absence of both cervix and uterus: Secondary | ICD-10-CM

## 2018-09-01 HISTORY — DX: Dependence on other enabling machines and devices: Z99.89

## 2018-09-01 HISTORY — PX: BREAST REDUCTION SURGERY: SHX8

## 2018-09-01 LAB — CBC
HCT: 31.8 % — ABNORMAL LOW (ref 36.0–46.0)
Hemoglobin: 9.4 g/dL — ABNORMAL LOW (ref 12.0–15.0)
MCH: 24.4 pg — ABNORMAL LOW (ref 26.0–34.0)
MCHC: 29.6 g/dL — ABNORMAL LOW (ref 30.0–36.0)
MCV: 82.4 fL (ref 80.0–100.0)
Platelets: 248 10*3/uL (ref 150–400)
RBC: 3.86 MIL/uL — ABNORMAL LOW (ref 3.87–5.11)
RDW: 19 % — ABNORMAL HIGH (ref 11.5–15.5)
WBC: 6 10*3/uL (ref 4.0–10.5)
nRBC: 0 % (ref 0.0–0.2)

## 2018-09-01 LAB — BASIC METABOLIC PANEL
Anion gap: 7 (ref 5–15)
BUN: 11 mg/dL (ref 6–20)
CO2: 25 mmol/L (ref 22–32)
Calcium: 8.6 mg/dL — ABNORMAL LOW (ref 8.9–10.3)
Chloride: 110 mmol/L (ref 98–111)
Creatinine, Ser: 0.57 mg/dL (ref 0.44–1.00)
GFR calc Af Amer: >60 mL/min (ref >60)
GFR calc non Af Amer: >60 mL/min (ref >60)
Glucose, Bld: 81 mg/dL (ref 70–99)
Potassium: 4 mmol/L (ref 3.5–5.1)
Sodium: 142 mmol/L (ref 135–145)

## 2018-09-01 SURGERY — MAMMOPLASTY, REDUCTION
Anesthesia: General | Site: Breast | Laterality: Bilateral

## 2018-09-01 MED ORDER — EPHEDRINE SULFATE-NACL 50-0.9 MG/10ML-% IV SOSY
PREFILLED_SYRINGE | INTRAVENOUS | Status: DC | PRN
  Administered 2018-09-01: 10 mg via INTRAVENOUS
  Administered 2018-09-01: 5 mg via INTRAVENOUS
  Administered 2018-09-01: 10 mg via INTRAVENOUS
  Administered 2018-09-01: 5 mg via INTRAVENOUS

## 2018-09-01 MED ORDER — PHENYLEPHRINE 40 MCG/ML (10ML) SYRINGE FOR IV PUSH (FOR BLOOD PRESSURE SUPPORT)
PREFILLED_SYRINGE | INTRAVENOUS | Status: AC
  Filled 2018-09-01: qty 10

## 2018-09-01 MED ORDER — LACTATED RINGERS IV SOLN
INTRAVENOUS | Status: DC
  Administered 2018-09-01 (×2): via INTRAVENOUS

## 2018-09-01 MED ORDER — FENTANYL CITRATE (PF) 250 MCG/5ML IJ SOLN
INTRAMUSCULAR | Status: AC
  Filled 2018-09-01: qty 5

## 2018-09-01 MED ORDER — HYDROMORPHONE HCL 1 MG/ML IJ SOLN
0.2500 mg | INTRAMUSCULAR | Status: DC | PRN
  Administered 2018-09-01 (×4): 0.5 mg via INTRAVENOUS

## 2018-09-01 MED ORDER — LIDOCAINE 2% (20 MG/ML) 5 ML SYRINGE
INTRAMUSCULAR | Status: AC
  Filled 2018-09-01: qty 5

## 2018-09-01 MED ORDER — FENTANYL CITRATE (PF) 100 MCG/2ML IJ SOLN
INTRAMUSCULAR | Status: DC | PRN
  Administered 2018-09-01 (×8): 50 ug via INTRAVENOUS

## 2018-09-01 MED ORDER — SUGAMMADEX SODIUM 500 MG/5ML IV SOLN
INTRAVENOUS | Status: AC
  Filled 2018-09-01: qty 5

## 2018-09-01 MED ORDER — SUCCINYLCHOLINE CHLORIDE 200 MG/10ML IV SOSY
PREFILLED_SYRINGE | INTRAVENOUS | Status: DC | PRN
  Administered 2018-09-01: 100 mg via INTRAVENOUS

## 2018-09-01 MED ORDER — DEXAMETHASONE SODIUM PHOSPHATE 10 MG/ML IJ SOLN
INTRAMUSCULAR | Status: DC | PRN
  Administered 2018-09-01: 10 mg via INTRAVENOUS

## 2018-09-01 MED ORDER — BUPIVACAINE-EPINEPHRINE (PF) 0.25% -1:200000 IJ SOLN
INTRAMUSCULAR | Status: AC
  Filled 2018-09-01: qty 30

## 2018-09-01 MED ORDER — SUGAMMADEX SODIUM 200 MG/2ML IV SOLN
INTRAVENOUS | Status: DC | PRN
  Administered 2018-09-01: 200 mg via INTRAVENOUS

## 2018-09-01 MED ORDER — 0.9 % SODIUM CHLORIDE (POUR BTL) OPTIME
TOPICAL | Status: DC | PRN
  Administered 2018-09-01: 1000 mL

## 2018-09-01 MED ORDER — LIDOCAINE 2% (20 MG/ML) 5 ML SYRINGE
INTRAMUSCULAR | Status: DC | PRN
  Administered 2018-09-01: 60 mg via INTRAVENOUS
  Administered 2018-09-01: 40 mg via INTRAVENOUS

## 2018-09-01 MED ORDER — ACETAMINOPHEN 325 MG PO TABS: 650.0000 mg | ORAL_TABLET | ORAL | Status: DC | PRN

## 2018-09-01 MED ORDER — DEXAMETHASONE SODIUM PHOSPHATE 10 MG/ML IJ SOLN
INTRAMUSCULAR | Status: AC
  Filled 2018-09-01: qty 1

## 2018-09-01 MED ORDER — HYDROMORPHONE HCL 1 MG/ML IJ SOLN
INTRAMUSCULAR | Status: AC
  Filled 2018-09-01: qty 1

## 2018-09-01 MED ORDER — OXYCODONE HCL 5 MG PO TABS
ORAL_TABLET | ORAL | Status: AC
  Filled 2018-09-01: qty 2

## 2018-09-01 MED ORDER — ONDANSETRON HCL 4 MG/2ML IJ SOLN
INTRAMUSCULAR | Status: AC
  Filled 2018-09-01: qty 2

## 2018-09-01 MED ORDER — PHENYLEPHRINE 40 MCG/ML (10ML) SYRINGE FOR IV PUSH (FOR BLOOD PRESSURE SUPPORT)
PREFILLED_SYRINGE | INTRAVENOUS | Status: DC | PRN
  Administered 2018-09-01 (×3): 80 ug via INTRAVENOUS
  Administered 2018-09-01: 40 ug via INTRAVENOUS
  Administered 2018-09-01: 120 ug via INTRAVENOUS
  Administered 2018-09-01 (×2): 60 ug via INTRAVENOUS
  Administered 2018-09-01: 120 ug via INTRAVENOUS
  Administered 2018-09-01 (×2): 80 ug via INTRAVENOUS

## 2018-09-01 MED ORDER — LIDOCAINE-EPINEPHRINE 1 %-1:100000 IJ SOLN
INTRAMUSCULAR | Status: AC
  Filled 2018-09-01: qty 1

## 2018-09-01 MED ORDER — OXYCODONE HCL 5 MG PO TABS
5.0000 mg | ORAL_TABLET | ORAL | Status: DC | PRN
  Administered 2018-09-01: 10 mg via ORAL

## 2018-09-01 MED ORDER — ONDANSETRON HCL 4 MG/2ML IJ SOLN
INTRAMUSCULAR | Status: DC | PRN
  Administered 2018-09-01 (×2): 4 mg via INTRAVENOUS

## 2018-09-01 MED ORDER — PROPOFOL 10 MG/ML IV BOLUS
INTRAVENOUS | Status: AC
  Filled 2018-09-01: qty 20

## 2018-09-01 MED ORDER — SODIUM CHLORIDE 0.9% FLUSH: 3.0000 mL | Freq: Two times a day (BID) | INTRAVENOUS | Status: DC

## 2018-09-01 MED ORDER — SODIUM CHLORIDE 0.9 % IV SOLN
INTRAVENOUS | Status: AC
  Filled 2018-09-01: qty 500000

## 2018-09-01 MED ORDER — LACTATED RINGERS IV SOLN: INTRAVENOUS | Status: DC

## 2018-09-01 MED ORDER — PROPOFOL 10 MG/ML IV BOLUS
INTRAVENOUS | Status: DC | PRN
  Administered 2018-09-01: 130 mg via INTRAVENOUS

## 2018-09-01 MED ORDER — MIDAZOLAM HCL 2 MG/2ML IJ SOLN
INTRAMUSCULAR | Status: AC
  Filled 2018-09-01: qty 2

## 2018-09-01 MED ORDER — ROCURONIUM BROMIDE 50 MG/5ML IV SOSY
PREFILLED_SYRINGE | INTRAVENOUS | Status: DC | PRN
  Administered 2018-09-01 (×3): 20 mg via INTRAVENOUS
  Administered 2018-09-01: 10 mg via INTRAVENOUS
  Administered 2018-09-01: 50 mg via INTRAVENOUS

## 2018-09-01 MED ORDER — SODIUM CHLORIDE 0.9% FLUSH: 3.0000 mL | INTRAVENOUS | Status: DC | PRN

## 2018-09-01 MED ORDER — BUPIVACAINE-EPINEPHRINE 0.25% -1:200000 IJ SOLN
INTRAMUSCULAR | Status: DC | PRN
  Administered 2018-09-01: 30 mL

## 2018-09-01 MED ORDER — HYDRALAZINE HCL 20 MG/ML IJ SOLN
INTRAMUSCULAR | Status: AC
  Filled 2018-09-01: qty 1

## 2018-09-01 MED ORDER — HYDRALAZINE HCL 20 MG/ML IJ SOLN
INTRAMUSCULAR | Status: DC | PRN
  Administered 2018-09-01 (×2): 5 mg via INTRAVENOUS

## 2018-09-01 MED ORDER — ACETAMINOPHEN 500 MG PO TABS
ORAL_TABLET | ORAL | Status: AC
  Filled 2018-09-01: qty 2

## 2018-09-01 MED ORDER — CEFAZOLIN SODIUM 1 G IJ SOLR
INTRAMUSCULAR | Status: AC
  Filled 2018-09-01: qty 20

## 2018-09-01 MED ORDER — GLYCOPYRROLATE PF 0.2 MG/ML IJ SOSY
PREFILLED_SYRINGE | INTRAMUSCULAR | Status: AC
  Filled 2018-09-01: qty 1

## 2018-09-01 MED ORDER — ACETAMINOPHEN 500 MG PO TABS
1000.0000 mg | ORAL_TABLET | Freq: Once | ORAL | Status: AC
  Administered 2018-09-01: 1000 mg via ORAL

## 2018-09-01 MED ORDER — GLYCOPYRROLATE PF 0.2 MG/ML IJ SOSY
PREFILLED_SYRINGE | INTRAMUSCULAR | Status: DC | PRN
  Administered 2018-09-01: .2 mg via INTRAVENOUS

## 2018-09-01 MED ORDER — ACETAMINOPHEN 650 MG RE SUPP: 650.0000 mg | RECTAL | Status: DC | PRN

## 2018-09-01 MED ORDER — MIDAZOLAM HCL 2 MG/2ML IJ SOLN
INTRAMUSCULAR | Status: DC | PRN
  Administered 2018-09-01: 2 mg via INTRAVENOUS

## 2018-09-01 MED ORDER — CHLORHEXIDINE GLUCONATE CLOTH 2 % EX PADS: 6.0000 | MEDICATED_PAD | Freq: Once | CUTANEOUS | Status: DC

## 2018-09-01 MED ORDER — SODIUM CHLORIDE 0.9 % IV SOLN: 250.0000 mL | INTRAVENOUS | Status: DC | PRN

## 2018-09-01 SURGICAL SUPPLY — 57 items
BAG DECANTER FOR FLEXI CONT (MISCELLANEOUS) ×3 IMPLANT
BINDER BREAST 3XL (GAUZE/BANDAGES/DRESSINGS) ×2 IMPLANT
BINDER BREAST LRG (GAUZE/BANDAGES/DRESSINGS) IMPLANT
BINDER BREAST MEDIUM (GAUZE/BANDAGES/DRESSINGS) IMPLANT
BINDER BREAST XLRG (GAUZE/BANDAGES/DRESSINGS) IMPLANT
BINDER BREAST XXLRG (GAUZE/BANDAGES/DRESSINGS) IMPLANT
BIOPATCH RED 1 DISK 7.0 (GAUZE/BANDAGES/DRESSINGS) ×4 IMPLANT
BIOPATCH RED 1IN DISK 7.0MM (GAUZE/BANDAGES/DRESSINGS) ×2
BLADE HEX COATED 2.75 (ELECTRODE) ×3 IMPLANT
BLADE SURG 15 STRL LF DISP TIS (BLADE) ×2 IMPLANT
BLADE SURG 15 STRL SS (BLADE) ×4
BNDG GAUZE ELAST 4 BULKY (GAUZE/BANDAGES/DRESSINGS) ×6 IMPLANT
CANISTER SUCT 3000ML PPV (MISCELLANEOUS) ×3 IMPLANT
CHLORAPREP W/TINT 26 (MISCELLANEOUS) ×3 IMPLANT
CLOSURE STERI-STRIP 1/4X4 (GAUZE/BANDAGES/DRESSINGS) ×6 IMPLANT
COVER WAND RF STERILE (DRAPES) ×1 IMPLANT
DECANTER SPIKE VIAL GLASS SM (MISCELLANEOUS) ×2 IMPLANT
DERMABOND ADVANCED (GAUZE/BANDAGES/DRESSINGS) ×8
DERMABOND ADVANCED .7 DNX12 (GAUZE/BANDAGES/DRESSINGS) ×2 IMPLANT
DRAIN CHANNEL 19F RND (DRAIN) ×4 IMPLANT
DRAPE LAPAROSCOPIC ABDOMINAL (DRAPES) ×3 IMPLANT
DRSG PAD ABDOMINAL 8X10 ST (GAUZE/BANDAGES/DRESSINGS) ×10 IMPLANT
ELECT BLADE 4.0 EZ CLEAN MEGAD (MISCELLANEOUS) ×3
ELECT REM PT RETURN 9FT ADLT (ELECTROSURGICAL) ×3
ELECTRODE BLDE 4.0 EZ CLN MEGD (MISCELLANEOUS) ×1 IMPLANT
ELECTRODE REM PT RTRN 9FT ADLT (ELECTROSURGICAL) ×1 IMPLANT
EVACUATOR SILICONE 100CC (DRAIN) ×4 IMPLANT
GLOVE BIO SURGEON STRL SZ 6.5 (GLOVE) ×4 IMPLANT
GLOVE BIO SURGEONS STRL SZ 6.5 (GLOVE) ×2
GOWN STRL REUS W/ TWL LRG LVL3 (GOWN DISPOSABLE) ×2 IMPLANT
GOWN STRL REUS W/TWL LRG LVL3 (GOWN DISPOSABLE) ×4
NDL HYPO 25GX1X1/2 BEV (NEEDLE) IMPLANT
NDL SPNL 22GX3.5 QUINCKE BK (NEEDLE) ×1 IMPLANT
NEEDLE HYPO 25GX1X1/2 BEV (NEEDLE) ×3 IMPLANT
NEEDLE SPNL 22GX3.5 QUINCKE BK (NEEDLE) ×3 IMPLANT
NS IRRIG 1000ML POUR BTL (IV SOLUTION) ×3 IMPLANT
PACK GENERAL/GYN (CUSTOM PROCEDURE TRAY) ×3 IMPLANT
PAD ABD 8X10 STRL (GAUZE/BANDAGES/DRESSINGS) ×8 IMPLANT
SPONGE LAP 18X18 RF (DISPOSABLE) ×10 IMPLANT
SUT MNCRL AB 3-0 PS2 18 (SUTURE) ×6 IMPLANT
SUT MNCRL AB 4-0 PS2 18 (SUTURE) ×14 IMPLANT
SUT MON AB 3-0 SH 27 (SUTURE) ×8
SUT MON AB 3-0 SH27 (SUTURE) IMPLANT
SUT MON AB 5-0 PS2 18 (SUTURE) ×8 IMPLANT
SUT PDS AB 2-0 CT2 27 (SUTURE) IMPLANT
SUT SILK 3 0 PS 1 (SUTURE) IMPLANT
SUT SILK 4 0 PS 2 (SUTURE) ×4 IMPLANT
SUT VIC AB 3-0 SH 27 (SUTURE) ×4
SUT VIC AB 3-0 SH 27X BRD (SUTURE) ×2 IMPLANT
SUT VICRYL 4-0 PS2 18IN ABS (SUTURE) ×6 IMPLANT
SYR 50ML LL SCALE MARK (SYRINGE) ×2 IMPLANT
SYR CONTROL 10ML LL (SYRINGE) ×2 IMPLANT
TOWEL GREEN STERILE FF (TOWEL DISPOSABLE) ×6 IMPLANT
TUBE CONNECTING 20'X1/4 (TUBING) ×1
TUBE CONNECTING 20X1/4 (TUBING) ×2 IMPLANT
UNDERPAD 30X30 (UNDERPADS AND DIAPERS) ×6 IMPLANT
YANKAUER SUCT BULB TIP NO VENT (SUCTIONS) ×3 IMPLANT

## 2018-09-01 NOTE — Transfer of Care (Signed)
Immediate Anesthesia Transfer of Care Note  Patient: Misty [name]  Procedure(s) Performed: MAMMARY REDUCTION  (BREAST) (Bilateral Breast)  Patient Location: PACU  Anesthesia Type:General  Level of Consciousness: awake, alert  and oriented  Airway & Oxygen Therapy: Patient Spontanous Breathing and Patient connected to nasal cannula oxygen  Post-op Assessment: Report given to RN, Post -op Vital signs reviewed and stable and Patient moving all extremities X 4  Post vital signs: Reviewed and stable  Last Vitals:  Vitals Value Taken Time  BP 123/70 09/01/18 1627  Temp    Pulse 73 09/01/18 1631  Resp 13 09/01/18 1631  SpO2 100 % 09/01/18 1631  Vitals shown include unvalidated device data.  Last Pain:  Vitals:   09/01/18 0926  TempSrc:   PainSc: 0-No pain      Patients Stated Pain Goal: 6 (94/32/76 1470)  Complications: No apparent anesthesia complications

## 2018-09-01 NOTE — Anesthesia Postprocedure Evaluation (Signed)
Anesthesia Post Note  Patient: Misty [name]  Procedure(s) Performed: MAMMARY REDUCTION  (BREAST) (Bilateral Breast)     Patient location during evaluation: PACU Anesthesia Type: General Level of consciousness: awake and alert Pain management: pain level controlled Vital Signs Assessment: post-procedure vital signs reviewed and stable Respiratory status: spontaneous breathing, nonlabored ventilation, respiratory function stable and patient connected to nasal cannula oxygen Cardiovascular status: blood pressure returned to baseline and stable Postop Assessment: no apparent nausea or vomiting Anesthetic complications: no    Last Vitals:  Vitals:   09/01/18 1630 09/01/18 1645  BP: 123/70 128/74  Pulse: 74 66  Resp: 13 13  Temp: 36.8 C   SpO2: 100%     Last Pain:  Vitals:   09/01/18 1630  TempSrc:   PainSc: 6                  Saisha Hogue,W. EDMOND

## 2018-09-01 NOTE — Op Note (Addendum)
Breast Reduction Op note:    DATE OF PROCEDURE: 09/01/2018  LOCATION: [facility]  SURGEON: Lyndee Leo Sanger , DO  ASSISTANT: Roetta Sessions, PA and [place], RNFA  PREOPERATIVE DIAGNOSIS 1. Macromastia 2. Neck Pain 3. Back Pain  POSTOPERATIVE DIAGNOSIS 1. Macromastia 2. Neck Pain 3. Back Pain  PROCEDURES 1. Bilateral breast reduction.  Right reduction 1751 g, Left reduction 2440 g  COMPLICATIONS: None.  DRAINS: two #19 blake drains  INDICATIONS FOR PROCEDURE Misty [name] is a 58 y.o. year old female born on 08/26/1960, with a history of symptomatic macromastia with concominant back pain, neck pain, shoulder grooving from her bra.   MRN: [id number]  CONSENT Informed consent was obtained directly from the patient. The risks, benefits and alternatives were fully discussed. Specific risks including but not limited to bleeding, infection, hematoma, seroma, scarring, pain, nipple necrosis, asymmetry, poor cosmetic results, and need for further surgery were discussed. The patient had ample opportunity to have her questions answered to her satisfaction.  DESCRIPTION OF PROCEDURE  Patient was brought into the operating room and placed in a supine position.  SCDs were placed and appropriate padding was performed.  Antibiotics were given. The patient underwent general anesthesia and the chest was prepped and draped in a sterile fashion.  A timeout was performed and all information was confirmed to be correct.  Right:  Preoperative markings were confirmed.  Incision lines were injected with local with epinephrine.  After waiting for vasoconstriction, the marked lines were incised.  An inferior pedical breast reduction was performed by de-epithelializing the pedicle, using bovie to create the lateral and medial pedicles, and removing breast tissue from the superior, lateral, and medial portions of the breast.  Care was taken to not undermine the  breast pedicle. Hemostasis was achieved.  The nipple was gently rotated into position and the skin was temporarily closed with staples.  The patient was sat upright and size and shape symmetry was confirmed.  The pocket was irrigated, a drain was placed, and hemostasis confirmed.  The deep tissues were approximated with 3-0 Monocryl sutures and the skin was closed with deep dermal and subcuticular 4-0 Monocryl sutures followed by 5-0 Monocryl.  The nipple areola complex was brought out with the skin de-epithelialized at the location to make place for the complex.  The area was secured with 4-0 Monocryl at the deep layers followed by 5-0 Monocryl.  The nipple and skin flaps had good capillary refill at the end of the procedure.   Left: Preoperative markings were confirmed.  Incision lines were injected with local with epinephrine.  After waiting for vasoconstriction, the marked lines were incised.  An inferior pedical breast reduction was performed by de-epithelializing the pedicle, using bovie to create the lateral and medial pedicles, and removing breast tissue from the superior, lateral, and medial portions of the breast.  Care was taken to not undermine the breast pedicle. Hemostasis was achieved.  The nipple was gently rotated into position and the skin was temporarily closed with staples.  The patient was sat upright and size and shape symmetry was confirmed.  The pocket was irrigated, a drain was placed, and hemostasis confirmed.  The deep tissues were approximated with 3-0 Monocryl sutures and the skin was closed with deep dermal and subcuticular 4-0 Monocryl sutures followed by 5-0 Monocryl.  The nipple areola complex was brought out with the skin de-epithelialized at the location to make place for the complex.  The area was secured with 4-0  Monocryl at the deep layers followed by 5-0 Monocryl.  The nipple and skin flaps had good capillary refill at the end of the procedure. The patient tolerated the  procedure well. The patient was allowed to wake from anesthesia and taken to the recovery room in satisfactory condition.  The advanced practice practitioner (APP) assisted throughout the case.  The APP was essential in retraction and counter traction when needed to make the case progress smoothly.  This retraction and assistance made it possible to see the tissue plans for the procedure.  The assistance was needed for blood control, tissue re-approximation and assisted with closure of the incision site.

## 2018-09-01 NOTE — Anesthesia Procedure Notes (Signed)
Procedure Name: Intubation Date/Time: 09/01/2018 10:46 AM Performed by: Genelle Bal, CRNA Pre-anesthesia Checklist: Patient identified, Emergency Drugs available, Suction available and Patient being monitored Patient Re-evaluated:Patient Re-evaluated prior to induction Oxygen Delivery Method: Circle system utilized Preoxygenation: Pre-oxygenation with 100% oxygen Induction Type: IV induction Ventilation: Mask ventilation without difficulty Laryngoscope Size: Miller and 2 Grade View: Grade I Tube type: Oral Tube size: 7.0 mm Number of attempts: 1 Airway Equipment and Method: Stylet and Oral airway Placement Confirmation: ETT inserted through vocal cords under direct vision,  positive ETCO2 and breath sounds checked- equal and bilateral Secured at: 21 cm Tube secured with: Tape Dental Injury: Teeth and Oropharynx as per pre-operative assessment

## 2018-09-01 NOTE — Interval H&P Note (Signed)
History and Physical Interval Note:  09/01/2018 9:26 AM  Misty [name]  has presented today for surgery, with the diagnosis of mammary hypertrophy.  The various methods of treatment have been discussed with the patient and family. After consideration of risks, benefits and other options for treatment, the patient has consented to  Procedure(s) with comments: MAMMARY REDUCTION  (BREAST) (Bilateral) - 4 hours, please as a surgical intervention.  The patient's history has been reviewed, patient examined, no change in status, stable for surgery.  I have reviewed the patient's chart and labs.  Questions were answered to the patient's satisfaction.     Loel Lofty Dillingham

## 2018-09-01 NOTE — Discharge Instructions (Signed)
INSTRUCTIONS FOR AFTER SURGERY   You are having surgery.  You will likely have some questions about what to expect following your operation.  The following information will help you and your family understand what to expect when you are discharged from the hospital.  Following these guidelines will help ensure a smooth recovery and reduce risks of complications.   Postoperative instructions include information on: diet, wound care, medications and physical activity.  AFTER SURGERY Expect to go home after the procedure.  In some cases, you may need to spend one night in the hospital for observation.  DIET This surgery does not require a specific diet.  However, I have to mention that the healthier you eat the better your body can start healing. It is important to increasing your protein intake.  This means limiting the foods with sugar and carbohydrates.  Focus on vegetables and some meat.  If you have any liposuction during your procedure be sure to drink water.  If your urine is bright yellow, then it is concentrated, and you need to drink more water.  As a general rule after surgery, you should have 8 ounces of water every hour while awake.  If you find you are persistently nauseated or unable to take in liquids let us know.  NO TOBACCO USE or EXPOSURE.  This will slow your healing process and increase the risk of a wound.  WOUND CARE While you have a drain: Clean with baby wipes for five days then can shower.   If you have steri-strips / tape directly attached to your skin leave them in place. It is OK to get these wet.  No baths, pools or hot tubs for two weeks. We close your incision to leave the smallest and best-looking scar. No ointment or creams on your incisions until given the go ahead.  Especially not Neosporin (Too many skin reactions with this one).  A few weeks after surgery you can use Mederma and start massaging the scar. We ask you to wear your binder or sports bra for the first 6  weeks around the clock, including while sleeping. This provides added comfort and helps reduce the fluid accumulation at the surgery site.  ACTIVITY No heavy lifting until cleared by the doctor.  It is OK to walk and climb stairs. In fact, moving your legs is very important to decrease your risk of a blood clot.  It will also help keep you from getting deconditioned.  Every 1 to 2 hours get up and walk for 5 minutes. This will help with a quicker recovery back to normal.  Let pain be your guide so you don't do too much.  NO, you cannot do the spring cleaning and don't plan on taking care of anyone else.  This is your time for TLC.  You will be more comfortable if you sleep and rest with your head elevated either with a few pillows under you or in a recliner.  No stomach sleeping for a few months.  WORK Everyone returns to work at different times. As a rough guide, most people take at least 1 - 2 weeks off prior to returning to work. If you need documentation for your job, bring the forms to your postoperative follow up visit.  DRIVING Arrange for someone to bring you home from the hospital.  You may be able to drive a few days after surgery but not while taking any narcotics or valium.  BOWEL MOVEMENTS Constipation can occur after anesthesia and  while taking pain medication.  It is important to stay ahead for your comfort.  We recommend taking Milk of Magnesia (2 tablespoons; twice a day) while taking the pain pills.  SEROMA This is fluid your body tried to put in the surgical site.  This is normal but if it creates tight skinny skin let us know.  It usually decreases in a few weeks.  WHEN TO CALL Call your surgeon's office if any of the following occur:  Fever 101 degrees F or greater  Excessive bleeding or fluid from the incision site.  Pain that increases over time without aid from the medications  Redness, warmth, or pus draining from incision sites  Persistent nausea or inability to  take in liquids  Severe misshapen area that underwent the operation.   [facility] Specialist  What is the benefit of having a drain?  During surgery your tissue layers are separated.  This raw surface stimulates your body to fill the space with serous fluid.  This is normal but you don't want that fluid to collect and prevent healing.  A fluid collection can also become infected.  The Jackson-Pratt (JP) drain is used to eliminate this collection of fluid and allow the tissue to heal together.    Jackson-Pratt (JP) bulb    How to care for your drainage and suction unit at home Your drainage catheter will be connected to a collection device. The vacuum caused when the device is compressed allows drainage to collect in the device.     Wash your hands with soap and water before and after touching the system.  Empty the JP drain every 12 hours once you get home from your procedure.  Record the fluid amount on the record sheet included.  Start with stripping the drain tube to push the clots or excess fluid to the bulb.  Do this by pinching the tube with one hand near your skin.  Then with the other hand squeeze the tubing and work it toward the bulb.  This should be done several times a day.  This may collapse the tube which will correct on its own.    Use a safety pin to attach your collection device to your clothing so there is no tension on the insertion site.    If you have drainage at the skin insertion site, you can apply a gauze dressing and secure it with tape.  If the drain falls out, apply a gauze dressing over the drain insertion site and secure with tape.   To empty the collection device:    Release the stopper on the top of the collection unit (bulb).   Pour contents into a measuring container such as a plastic medicine cup.   Record the day and amount of drainage on the attached sheet.  This should be done at least twice a day.    To compress the  Jackson-Pratt Bulb:   Release the stopper at the top of the bulb.  Squeeze the bulb tightly in your fist, squeezing air out of the bulb.   Replace the stopper while the bulb is compressed.   Be careful not to spill the contents when squeezing the bulb.  The drainage will start bright red and turn to pink and then yellow with time.  IMPORTANT: If the bulb is not squeezed before adding the stopper it will not draw out the fluid.  Care for the JP drain site and your skin daily:   You may shower  three days after surgery.  Secure the drain to a ribbon or cloth around your waist while showering so it does not pull out while showering.  Be sure your hands are cleaned with soap and water.  Use a clean wet cotton swab to clean the skin around the drain site.   Use another cotton swab to place Vaseline or antibiotic ointment on the skin around the drain.     Contact your physician if any of the following occur:   The fluid in the bulb becomes cloudy.  Your temperature is greater than 101.4.   The incision opens.  If you have drainage at the skin insertion site, you can apply a gauze dressing and secure it with tape.  If the drain falls out, apply a gauze dressing over the drain insertion site and secure with tape.   You will usually have more drainage when you are active than while you rest or are asleep. If the drainage increases significantly or is bloody call the physician                             Bring this record with you to each office visit Date  Drainage Volume  Date   Drainage volume

## 2018-09-02 ENCOUNTER — Encounter (HOSPITAL_COMMUNITY): Payer: Self-pay | Admitting: Plastic Surgery

## 2018-09-02 ENCOUNTER — Encounter: Payer: Managed Care, Other (non HMO) | Admitting: Family Medicine

## 2018-09-02 ENCOUNTER — Telehealth: Payer: Self-pay

## 2018-09-02 NOTE — Telephone Encounter (Signed)
Called and spoke with Mr. Maslowski.  He reports that EMS came and evaluated his wife, Mrs. Gutknecht.  They took her blood pressure and some vitals and according to Mr. Dlouhy they did not feel as if she needed to go to the emergency room.  He was unsure if her blood pressure was low, but she is now resting in her chair and drinking Pedialyte and eating.  She has not had any fevers, chills, nausea, vomiting.  Prior to her getting dizzy and becoming unstable she had not had much to drink or eat over the past day.  Suspect likely dehydrated due to low oral intake of fluids and food.  Mr. Dembowski stated that she is doing fine now and will call us if any thing changes.

## 2018-09-02 NOTE — Telephone Encounter (Signed)
Received call from [place], patient's husband providing [place] with an update on the patient. PA Matt spoke with him earlier, but since then he states she has had 2 more episodes of dizziness where he has had to lower her to the floor. He wanted to inform Dr. Marla Roe that he has now called EMS to help lift her up.

## 2018-09-03 ENCOUNTER — Other Ambulatory Visit: Payer: Self-pay

## 2018-09-03 ENCOUNTER — Emergency Department (HOSPITAL_COMMUNITY): Payer: Managed Care, Other (non HMO)

## 2018-09-03 ENCOUNTER — Inpatient Hospital Stay (HOSPITAL_COMMUNITY)
Admission: EM | Admit: 2018-09-03 | Discharge: 2018-09-08 | DRG: 987 | Disposition: A | Discharge status: Skilled Nursing Facility | Payer: Managed Care, Other (non HMO) | Attending: Internal Medicine | Admitting: Internal Medicine

## 2018-09-03 ENCOUNTER — Encounter (HOSPITAL_COMMUNITY): Payer: Self-pay | Admitting: Emergency Medicine

## 2018-09-03 ENCOUNTER — Inpatient Hospital Stay (HOSPITAL_COMMUNITY): Payer: Managed Care, Other (non HMO)

## 2018-09-03 DIAGNOSIS — R531 Weakness: Secondary | ICD-10-CM

## 2018-09-03 DIAGNOSIS — D62 Acute posthemorrhagic anemia: Secondary | ICD-10-CM | POA: Diagnosis present

## 2018-09-03 DIAGNOSIS — Z9221 Personal history of antineoplastic chemotherapy: Secondary | ICD-10-CM | POA: Diagnosis not present

## 2018-09-03 DIAGNOSIS — Y838 Other surgical procedures as the cause of abnormal reaction of the patient, or of later complication, without mention of misadventure at the time of the procedure: Secondary | ICD-10-CM | POA: Diagnosis not present

## 2018-09-03 DIAGNOSIS — N62 Hypertrophy of breast: Secondary | ICD-10-CM | POA: Diagnosis present

## 2018-09-03 DIAGNOSIS — D509 Iron deficiency anemia, unspecified: Secondary | ICD-10-CM | POA: Diagnosis not present

## 2018-09-03 DIAGNOSIS — E43 Unspecified severe protein-calorie malnutrition: Secondary | ICD-10-CM | POA: Diagnosis present

## 2018-09-03 DIAGNOSIS — I10 Essential (primary) hypertension: Secondary | ICD-10-CM | POA: Diagnosis present

## 2018-09-03 DIAGNOSIS — R296 Repeated falls: Secondary | ICD-10-CM | POA: Diagnosis present

## 2018-09-03 DIAGNOSIS — Z808 Family history of malignant neoplasm of other organs or systems: Secondary | ICD-10-CM | POA: Diagnosis not present

## 2018-09-03 DIAGNOSIS — J189 Pneumonia, unspecified organism: Secondary | ICD-10-CM

## 2018-09-03 DIAGNOSIS — Z9049 Acquired absence of other specified parts of digestive tract: Secondary | ICD-10-CM | POA: Diagnosis not present

## 2018-09-03 DIAGNOSIS — F329 Major depressive disorder, single episode, unspecified: Secondary | ICD-10-CM | POA: Diagnosis present

## 2018-09-03 DIAGNOSIS — Z90710 Acquired absence of both cervix and uterus: Secondary | ICD-10-CM | POA: Diagnosis not present

## 2018-09-03 DIAGNOSIS — Y95 Nosocomial condition: Secondary | ICD-10-CM | POA: Diagnosis present

## 2018-09-03 DIAGNOSIS — J9601 Acute respiratory failure with hypoxia: Secondary | ICD-10-CM | POA: Diagnosis present

## 2018-09-03 DIAGNOSIS — D649 Anemia, unspecified: Secondary | ICD-10-CM | POA: Diagnosis not present

## 2018-09-03 DIAGNOSIS — J954 Chemical pneumonitis due to anesthesia: Secondary | ICD-10-CM | POA: Diagnosis present

## 2018-09-03 DIAGNOSIS — Z9889 Other specified postprocedural states: Secondary | ICD-10-CM

## 2018-09-03 DIAGNOSIS — J181 Lobar pneumonia, unspecified organism: Secondary | ICD-10-CM | POA: Diagnosis not present

## 2018-09-03 DIAGNOSIS — Z8542 Personal history of malignant neoplasm of other parts of uterus: Secondary | ICD-10-CM | POA: Diagnosis not present

## 2018-09-03 DIAGNOSIS — Z20828 Contact with and (suspected) exposure to other viral communicable diseases: Secondary | ICD-10-CM | POA: Diagnosis present

## 2018-09-03 DIAGNOSIS — Z6841 Body Mass Index (BMI) 40.0 and over, adult: Secondary | ICD-10-CM | POA: Diagnosis not present

## 2018-09-03 DIAGNOSIS — I700 Atherosclerosis of aorta: Secondary | ICD-10-CM | POA: Diagnosis present

## 2018-09-03 DIAGNOSIS — Z79899 Other long term (current) drug therapy: Secondary | ICD-10-CM | POA: Diagnosis not present

## 2018-09-03 DIAGNOSIS — K219 Gastro-esophageal reflux disease without esophagitis: Secondary | ICD-10-CM | POA: Diagnosis present

## 2018-09-03 DIAGNOSIS — K5900 Constipation, unspecified: Secondary | ICD-10-CM | POA: Diagnosis not present

## 2018-09-03 DIAGNOSIS — Z923 Personal history of irradiation: Secondary | ICD-10-CM | POA: Diagnosis not present

## 2018-09-03 DIAGNOSIS — T451X5A Adverse effect of antineoplastic and immunosuppressive drugs, initial encounter: Secondary | ICD-10-CM | POA: Diagnosis present

## 2018-09-03 DIAGNOSIS — G620 Drug-induced polyneuropathy: Secondary | ICD-10-CM | POA: Diagnosis present

## 2018-09-03 DIAGNOSIS — F419 Anxiety disorder, unspecified: Secondary | ICD-10-CM | POA: Diagnosis present

## 2018-09-03 LAB — COMPREHENSIVE METABOLIC PANEL
ALT: 13 U/L (ref 0–44)
AST: 16 U/L (ref 15–41)
Albumin: 2.7 g/dL — ABNORMAL LOW (ref 3.5–5.0)
Alkaline Phosphatase: 40 U/L (ref 38–126)
Anion gap: 8 (ref 5–15)
BUN: 18 mg/dL (ref 6–20)
CO2: 26 mmol/L (ref 22–32)
Calcium: 7.9 mg/dL — ABNORMAL LOW (ref 8.9–10.3)
Chloride: 103 mmol/L (ref 98–111)
Creatinine, Ser: 0.83 mg/dL (ref 0.44–1.00)
GFR calc Af Amer: >60 mL/min (ref >60)
GFR calc non Af Amer: >60 mL/min (ref >60)
Glucose, Bld: 108 mg/dL — ABNORMAL HIGH (ref 70–99)
Potassium: 3.8 mmol/L (ref 3.5–5.1)
Sodium: 137 mmol/L (ref 135–145)
Total Bilirubin: 0.4 mg/dL (ref 0.3–1.2)
Total Protein: 5.2 g/dL — ABNORMAL LOW (ref 6.5–8.1)

## 2018-09-03 LAB — CBC WITH DIFFERENTIAL/PLATELET
Abs Immature Granulocytes: 0.05 10*3/uL (ref 0.00–0.07)
Basophils Absolute: 0 10*3/uL (ref 0.0–0.1)
Basophils Relative: 0 %
Eosinophils Absolute: 0.2 10*3/uL (ref 0.0–0.5)
Eosinophils Relative: 2 %
HCT: 25.4 % — ABNORMAL LOW (ref 36.0–46.0)
Hemoglobin: 7.4 g/dL — ABNORMAL LOW (ref 12.0–15.0)
Immature Granulocytes: 0 %
Lymphocytes Relative: 6 %
Lymphs Abs: 0.7 10*3/uL (ref 0.7–4.0)
MCH: 24.4 pg — ABNORMAL LOW (ref 26.0–34.0)
MCHC: 29.1 g/dL — ABNORMAL LOW (ref 30.0–36.0)
MCV: 83.8 fL (ref 80.0–100.0)
Monocytes Absolute: 1 10*3/uL (ref 0.1–1.0)
Monocytes Relative: 9 %
Neutro Abs: 9.9 10*3/uL — ABNORMAL HIGH (ref 1.7–7.7)
Neutrophils Relative %: 83 %
Platelets: 211 10*3/uL (ref 150–400)
RBC: 3.03 MIL/uL — ABNORMAL LOW (ref 3.87–5.11)
RDW: 19.3 % — ABNORMAL HIGH (ref 11.5–15.5)
WBC: 11.9 10*3/uL — ABNORMAL HIGH (ref 4.0–10.5)
nRBC: 0 % (ref 0.0–0.2)

## 2018-09-03 LAB — URINALYSIS, ROUTINE W REFLEX MICROSCOPIC
Bacteria, UA: NONE SEEN
Bilirubin Urine: NEGATIVE
Glucose, UA: NEGATIVE mg/dL
Hgb urine dipstick: NEGATIVE
Ketones, ur: NEGATIVE mg/dL
Nitrite: NEGATIVE
Protein, ur: NEGATIVE mg/dL
Specific Gravity, Urine: 1.027 (ref 1.005–1.030)
pH: 5 (ref 5.0–8.0)

## 2018-09-03 LAB — CBC
HCT: 25.6 % — ABNORMAL LOW (ref 36.0–46.0)
Hemoglobin: 7.4 g/dL — ABNORMAL LOW (ref 12.0–15.0)
MCH: 23.9 pg — ABNORMAL LOW (ref 26.0–34.0)
MCHC: 28.9 g/dL — ABNORMAL LOW (ref 30.0–36.0)
MCV: 82.8 fL (ref 80.0–100.0)
Platelets: 209 10*3/uL (ref 150–400)
RBC: 3.09 MIL/uL — ABNORMAL LOW (ref 3.87–5.11)
RDW: 19.3 % — ABNORMAL HIGH (ref 11.5–15.5)
WBC: 11.3 10*3/uL — ABNORMAL HIGH (ref 4.0–10.5)
nRBC: 0 % (ref 0.0–0.2)

## 2018-09-03 LAB — FERRITIN: Ferritin: 14 ng/mL (ref 11–307)

## 2018-09-03 LAB — LACTIC ACID, PLASMA
Lactic Acid, Venous: 1 mmol/L (ref 0.5–1.9)
Lactic Acid, Venous: 0.9 mmol/L (ref 0.5–1.9)

## 2018-09-03 LAB — IRON AND TIBC
Iron: 8 ug/dL — ABNORMAL LOW (ref 28–170)
Saturation Ratios: 3 % — ABNORMAL LOW (ref 10.4–31.8)
TIBC: 308 ug/dL (ref 250–450)
UIBC: 300 ug/dL

## 2018-09-03 LAB — PROCALCITONIN: Procalcitonin: 3.46 ng/mL

## 2018-09-03 LAB — STREP PNEUMONIAE URINARY ANTIGEN: Strep Pneumo Urinary Antigen: NEGATIVE

## 2018-09-03 LAB — SEDIMENTATION RATE: Sed Rate: 42 mm/hr — ABNORMAL HIGH (ref 0–22)

## 2018-09-03 LAB — VITAMIN B12: Vitamin B-12: 1137 pg/mL — ABNORMAL HIGH (ref 180–914)

## 2018-09-03 MED ORDER — SODIUM CHLORIDE 0.9 % IV SOLN
2.0000 g | Freq: Once | INTRAVENOUS | Status: AC
  Administered 2018-09-03: 08:00:00 2 g via INTRAVENOUS
  Filled 2018-09-03: qty 2

## 2018-09-03 MED ORDER — VANCOMYCIN HCL 10 G IV SOLR
1750.0000 mg | INTRAVENOUS | Status: DC
  Administered 2018-09-04 – 2018-09-06 (×3): 1750 mg via INTRAVENOUS
  Filled 2018-09-03 (×3): qty 1750

## 2018-09-03 MED ORDER — ACETAMINOPHEN 325 MG PO TABS
650.0000 mg | ORAL_TABLET | Freq: Four times a day (QID) | ORAL | Status: DC | PRN
  Administered 2018-09-04 (×2): 650 mg via ORAL
  Filled 2018-09-03 (×2): qty 2

## 2018-09-03 MED ORDER — SODIUM CHLORIDE 0.9 % IV BOLUS
500.0000 mL | Freq: Once | INTRAVENOUS | Status: AC
  Administered 2018-09-03: 500 mL via INTRAVENOUS

## 2018-09-03 MED ORDER — ONDANSETRON HCL 4 MG PO TABS
4.0000 mg | ORAL_TABLET | Freq: Four times a day (QID) | ORAL | Status: DC
  Administered 2018-09-03 – 2018-09-06 (×11): 4 mg via ORAL
  Filled 2018-09-03 (×12): qty 1

## 2018-09-03 MED ORDER — TRAMADOL HCL 50 MG PO TABS
50.0000 mg | ORAL_TABLET | Freq: Four times a day (QID) | ORAL | Status: DC | PRN
  Administered 2018-09-04 – 2018-09-08 (×13): 50 mg via ORAL
  Filled 2018-09-03 (×13): qty 1

## 2018-09-03 MED ORDER — ENOXAPARIN SODIUM 60 MG/0.6ML ~~LOC~~ SOLN
60.0000 mg | SUBCUTANEOUS | Status: DC
  Filled 2018-09-03: qty 0.6

## 2018-09-03 MED ORDER — SODIUM CHLORIDE 0.9 % IV SOLN: INTRAVENOUS | Status: AC

## 2018-09-03 MED ORDER — ALPRAZOLAM 0.25 MG PO TABS: 0.2500 mg | ORAL_TABLET | Freq: Every day | ORAL | Status: DC | PRN

## 2018-09-03 MED ORDER — IOHEXOL 350 MG/ML SOLN
100.0000 mL | Freq: Once | INTRAVENOUS | Status: AC | PRN
  Administered 2018-09-03: 04:00:00 100 mL via INTRAVENOUS

## 2018-09-03 MED ORDER — SODIUM CHLORIDE 0.9% FLUSH
3.0000 mL | Freq: Once | INTRAVENOUS | Status: AC
  Administered 2018-09-03: 01:00:00 3 mL via INTRAVENOUS

## 2018-09-03 MED ORDER — GABAPENTIN 300 MG PO CAPS
900.0000 mg | ORAL_CAPSULE | Freq: Three times a day (TID) | ORAL | Status: DC
  Administered 2018-09-03 – 2018-09-08 (×15): 900 mg via ORAL
  Filled 2018-09-03 (×15): qty 3

## 2018-09-03 MED ORDER — CITALOPRAM HYDROBROMIDE 20 MG PO TABS
40.0000 mg | ORAL_TABLET | Freq: Every day | ORAL | Status: DC
  Administered 2018-09-04 – 2018-09-08 (×5): 40 mg via ORAL
  Filled 2018-09-03 (×5): qty 2

## 2018-09-03 MED ORDER — GADOBUTROL 1 MMOL/ML IV SOLN
10.0000 mL | Freq: Once | INTRAVENOUS | Status: AC | PRN
  Administered 2018-09-03: 10:00:00 10 mL via INTRAVENOUS

## 2018-09-03 MED ORDER — SODIUM CHLORIDE 0.9 % IV SOLN
2.0000 g | Freq: Three times a day (TID) | INTRAVENOUS | Status: DC
  Administered 2018-09-03 – 2018-09-06 (×9): 2 g via INTRAVENOUS
  Filled 2018-09-03 (×10): qty 2

## 2018-09-03 MED ORDER — VANCOMYCIN HCL 10 G IV SOLR
2500.0000 mg | Freq: Once | INTRAVENOUS | Status: AC
  Administered 2018-09-03: 08:00:00 2500 mg via INTRAVENOUS
  Filled 2018-09-03: qty 2500

## 2018-09-03 NOTE — H&P (Signed)
TRH H&P    Patient Demographics:    Misty [name], is a 59 y.o. female  MRN: [id number]  DOB - [birth year]  Admit Date - 09/03/2018  Referring MD/NP/PA:   Trish Mage  Outpatient Primary MD for the patient is Nani Ravens, Crosby Oyster, DO  Patient coming from: home  Chief complaint-   Fever, weakness,  Pox 88% on RA   HPI:    Misty [name]  is a 58 y.o. female, w hypertension, neuropathy, gerd, who had bilateral breast reduction now presents with c/o fever to 102, as well as weakness and hypoxia.  Slight dyspnea.  Denies cough, cp, palp, n/v,  abd pain, diarrhea, brbpr.   In ED,  T 99.7  P 81,  R 20  Bp 104/59  Pox 98% on o2 [place]  CXR IMPRESSION: 1. Low lung volumes with presumed postoperative atelectasis at the lung bases. 2. No pneumothorax. 3. Cardiomegaly. 4. Bilateral surgical drains, presumably related to the recent surgical intervention. There is subcutaneous gas overlying the patient's chest wall likely related to the recent surgical intervention.  CTA chest IMPRESSION: No evidence for acute pulmonary embolus.  Patchy ground-glass and consolidative opacities particularly within the right lower lobe which may represent atelectasis. Infection not excluded.  Interval postsurgical changes compatible with bilateral breast reduction surgery.  Aortic Atherosclerosis (ICD10-I70.0).  Na 137, K 3.8, Bun 18, Creatinine 0.83 Alb 2.7 Ast 16, Alt 13 Wbc 11.9, hgb 7.4, Plt 211 Lactic acid 1.0 Blood culture x2 pending   Urinalysis wbc 6-10,  Epi 11-20, rbc 0-5  Pt will be admitted for fever and hypoxia, ? Hcap, , and anemia.     Review of systems:    In addition to the HPI above,  No Fever-chills, No Headache, No changes with Vision or hearing, No problems swallowing food or Liquids, No Chest pain, No Cough  No Abdominal pain, No Nausea or Vomiting, bowel movements are  regular, No Blood in stool or Urine, No dysuria, No new skin rashes or bruises, No new joints pains-aches,  No new weakness, tingling, numbness in any extremity, No recent weight gain or loss, No polyuria, polydypsia or polyphagia, No significant Mental Stressors.  All other systems reviewed and are negative.    Past History of the following :    Past Medical History:  Diagnosis Date   Ambulates with cane    Anemia    prior to hysterectomy   Ankle fracture    Anxiety    Aortic atherosclerosis (HCC)    Cardiomegaly    Mild   Endometrial cancer (HCC)    Family history of brain cancer    Family history of breast cancer    Family history of colon cancer    Family history of colonic polyps    Family history of prostate cancer    History of chemotherapy    History of gallstones    History of hiatal hernia    History of radiation therapy 09/07/16-10/07/16   HDR to vaginal vault 30 gy in 5 fractions   Hypertension  No BP medications, related more to anxiety   Inguinal hernia    Right   Neuropathy    Chemo induce, feet and fingers   Paraumbilical hernia    Small      Past Surgical History:  Procedure Laterality Date   ANKLE FRACTURE SURGERY Right    BREAST REDUCTION SURGERY Bilateral 09/01/2018   Procedure: MAMMARY REDUCTION  (BREAST);  Surgeon: Wallace Going, DO;  Location: [facility];  Service: Plastics;  Laterality: Bilateral;  4 hours, please   CHOLECYSTECTOMY     INGUINAL HERNIA REPAIR Right 08/20/2017   Procedure: OPEN RIGHT INGUINAL HERNIA REPAIR ERA PATHWAY;  Surgeon: Alphonsa Overall, MD;  Location: [facility];  Service: General;  Laterality: Right;   INSERTION OF MESH Right 08/20/2017   Procedure: INSERTION OF MESH;  Surgeon: Alphonsa Overall, MD;  Location: [facility];  Service: General;  Laterality: Right;   IR FLUORO GUIDE PORT INSERTION RIGHT  07/31/2016   IR REMOVAL TUN ACCESS W/ PORT W/O FL MOD SED  01/13/2017   IR US GUIDE VASC ACCESS RIGHT   07/31/2016   LAPAROSCOPY N/A 08/20/2017   Procedure: ABDOMINAL LAPAROSCOPY;  Surgeon: Alphonsa Overall, MD;  Location: [facility];  Service: General;  Laterality: N/A;   LYMPH NODE BIOPSY N/A 06/25/2016   Procedure: LYMPH NODE BIOPSY;  Surgeon: Everitt Amber, MD;  Location: [facility];  Service: Gynecology;  Laterality: N/A;   ROBOTIC ASSISTED TOTAL HYSTERECTOMY WITH BILATERAL SALPINGO OOPHERECTOMY Bilateral 06/25/2016   Procedure: XI ROBOTIC ASSISTED TOTAL HYSTERECTOMY WITH BILATERAL SALPINGO OOPHORECTOMY for uterus greater 250 grams ;  Surgeon: Everitt Amber, MD;  Location: [facility];  Service: Gynecology;  Laterality: Bilateral;   TONSILLECTOMY     UMBILICAL HERNIA REPAIR N/A 08/20/2017   Procedure: HERNIA REPAIR UMBILICAL ADULT;  Surgeon: Alphonsa Overall, MD;  Location: [facility];  Service: General;  Laterality: N/A;      Social History:      Social History   Tobacco Use   Smoking status: Never Smoker   Smokeless tobacco: Never Used  Substance Use Topics   Alcohol use: Not Currently    Alcohol/week: 1.0 standard drinks    Types: 1 Glasses of wine per week    Frequency: Never       Family History :     Family History  Problem Relation Age of Onset   Breast cancer Sister 51   Colon cancer Paternal Grandfather 3   Breast cancer Sister 62       Negative genetic testing on a 46 gene panel through [facility]   Thyroid cancer Sister 75   Fibroids Mother    Colon polyps Father    Healthy Brother    Breast cancer Maternal Aunt 55   Prostate cancer Maternal Uncle 95   Healthy Paternal Aunt    Heart disease Maternal Grandmother    Stroke Paternal Grandmother    Colon polyps Sister    Colon polyps Sister    Healthy Sister    Healthy Brother    Brain cancer Maternal Aunt 38       died 3 days after diagnosis   Breast cancer Maternal Aunt 80   Healthy Paternal Aunt        Home Medications:   Prior to Admission medications   Medication Sig Start Date End Date Taking?  Authorizing Provider  acetaminophen (TYLENOL) 500 MG tablet Take 1,000 mg by mouth 3 (three) times daily.     [provider]  ALPRAZolam Duanne Moron) 0.5 MG tablet Take  0.25-0.5 mg by mouth daily as needed for anxiety (FOR PANIC/ANXIETY).     [provider]  b complex vitamins tablet Take 1 tablet by mouth daily at 2 PM.     [provider]  citalopram (CELEXA) 40 MG tablet Take 40 mg daily by mouth. 11/25/16   [provider]  gabapentin (NEURONTIN) 300 MG capsule TAKE 3 CAPSULES (900 MG TOTAL) BY MOUTH 3 (THREE) TIMES DAILY. 11/24/17 08/25/19  Alda Berthold, DO  Multiple Vitamin (MULTIVITAMIN WITH MINERALS) TABS tablet Take 1 tablet by mouth daily at 2 PM.    [provider]  ondansetron (ZOFRAN) 4 MG tablet Take 1 tablet (4 mg total) by mouth every 8 (eight) hours as needed for nausea or vomiting. 08/23/18   Scheeler, Carola Rhine, PA-C     Allergies:    No Known Allergies   Physical Exam:   Vitals  Blood pressure 133/90, pulse 81, temperature 99.7 F (37.6 C), temperature source Oral, resp. rate 18, last menstrual period 05/31/2016, SpO2 99 %.  1.  General: axoxo3  2. Psychiatric: Euthymic  3. Neurologic: Nonfocal  4. HEENMT:  Anicteric, pupils 1.5 mm, symmetric, direct, consensual intact Neck no JVD  5. Respiratory : Slight crackles , no wheezing,   6. Cardiovascular : Regular rate rhythm S1-S2  7. Gastrointestinal:  Abdomen: Obese nontender nondistended positive bowel sounds  8. Skin:  Extremities: No cyanosis clubbing or edema  9.Musculoskeletal:  Good range of motion    Data Review:    CBC Recent Labs  Lab 09/01/18 0907 09/03/18 0027  WBC 6.0 11.9*  HGB 9.4* 7.4*  HCT 31.8* 25.4*  PLT 248 211  MCV 82.4 83.8  MCH 24.4* 24.4*  MCHC 29.6* 29.1*  RDW 19.0* 19.3*  LYMPHSABS  --  0.7  MONOABS  --  1.0  EOSABS  --  0.2  BASOSABS  --  0.0    ------------------------------------------------------------------------------------------------------------------  Results for orders placed or performed during the hospital encounter of 09/03/18 (from the past 48 hour(s))  Comprehensive metabolic panel     Status: Abnormal   Collection Time: 09/03/18 12:27 AM  Result Value Ref Range   Sodium 137 135 - 145 mmol/L   Potassium 3.8 3.5 - 5.1 mmol/L   Chloride 103 98 - 111 mmol/L   CO2 26 22 - 32 mmol/L   Glucose, Bld 108 (H) 70 - 99 mg/dL   BUN 18 6 - 20 mg/dL   Creatinine, Ser 0.83 0.44 - 1.00 mg/dL   Calcium 7.9 (L) 8.9 - 10.3 mg/dL   Total Protein 5.2 (L) 6.5 - 8.1 g/dL   Albumin 2.7 (L) 3.5 - 5.0 g/dL   AST 16 15 - 41 U/L   ALT 13 0 - 44 U/L   Alkaline Phosphatase 40 38 - 126 U/L   Total Bilirubin 0.4 0.3 - 1.2 mg/dL   GFR calc non Af Amer >60 >60 mL/min   GFR calc Af Amer >60 >60 mL/min   Anion gap 8 5 - 15    Comment: Performed at [facility], [address] [place], [place] [address]  CBC with Differential     Status: Abnormal   Collection Time: 09/03/18 12:27 AM  Result Value Ref Range   WBC 11.9 (H) 4.0 - 10.5 K/uL   RBC 3.03 (L) 3.87 - 5.11 MIL/uL   Hemoglobin 7.4 (L) 12.0 - 15.0 g/dL    Comment: REPEATED TO VERIFY DELTA CHECK NOTED    HCT 25.4 (L) 36.0 -  46.0 %   MCV 83.8 80.0 - 100.0 fL   MCH 24.4 (L) 26.0 - 34.0 pg   MCHC 29.1 (L) 30.0 - 36.0 g/dL   RDW 19.3 (H) 11.5 - 15.5 %   Platelets 211 150 - 400 K/uL   nRBC 0.0 0.0 - 0.2 %   Neutrophils Relative % 83 %   Neutro Abs 9.9 (H) 1.7 - 7.7 K/uL   Lymphocytes Relative 6 %   Lymphs Abs 0.7 0.7 - 4.0 K/uL   Monocytes Relative 9 %   Monocytes Absolute 1.0 0.1 - 1.0 K/uL   Eosinophils Relative 2 %   Eosinophils Absolute 0.2 0.0 - 0.5 K/uL   Basophils Relative 0 %   Basophils Absolute 0.0 0.0 - 0.1 K/uL   Immature Granulocytes 0 %   Abs Immature Granulocytes 0.05 0.00 - 0.07 K/uL    Comment: Performed at [facility] [address]  [place], [place] [address]  Lactic acid, plasma     Status: None   Collection Time: 09/03/18  1:03 AM  Result Value Ref Range   Lactic Acid, Venous 1.0 0.5 - 1.9 mmol/L    Comment: Performed at [facility] [address], [place], [place] [address]  Urinalysis, Routine w reflex microscopic     Status: Abnormal   Collection Time: 09/03/18  3:40 AM  Result Value Ref Range   Color, Urine YELLOW YELLOW   APPearance CLOUDY (A) CLEAR   Specific Gravity, Urine 1.027 1.005 - 1.030   pH 5.0 5.0 - 8.0   Glucose, UA NEGATIVE NEGATIVE mg/dL   Hgb urine dipstick NEGATIVE NEGATIVE   Bilirubin Urine NEGATIVE NEGATIVE   Ketones, ur NEGATIVE NEGATIVE mg/dL   Protein, ur NEGATIVE NEGATIVE mg/dL   Nitrite NEGATIVE NEGATIVE   Leukocytes,Ua MODERATE (A) NEGATIVE   RBC / HPF 0-5 0 - 5 RBC/hpf   WBC, UA 6-10 0 - 5 WBC/hpf   Bacteria, UA NONE SEEN NONE SEEN   Squamous Epithelial / LPF 11-20 0 - 5   Mucus PRESENT    Non Squamous Epithelial 0-5 (A) NONE SEEN    Comment: Performed at [facility], [place] [address] [place],  [address]    Chemistries  Recent Labs  Lab 09/01/18 [phone] 09/03/18 0027  NA 142 137  K 4.0 3.8  CL 110 103  CO2 25 26  GLUCOSE 81 108*  BUN 11 18  CREATININE 0.57 0.83  CALCIUM 8.6* 7.9*  AST  --  16  ALT  --  13  ALKPHOS  --  40  BILITOT  --  0.4   ------------------------------------------------------------------------------------------------------------------  ------------------------------------------------------------------------------------------------------------------ GFR: Estimated Creatinine Clearance: 97.5 mL/min (by C-G formula based on SCr of 0.83 mg/dL). Liver Function Tests: Recent Labs  Lab 09/03/18 0027  AST 16  ALT 13  ALKPHOS 40  BILITOT 0.4  PROT 5.2*  ALBUMIN 2.7*   No results for input(s): LIPASE, AMYLASE in the last 168 hours. No results for input(s): AMMONIA in the last 168 hours. Coagulation Profile: No results for  input(s): INR, PROTIME in the last 168 hours. Cardiac Enzymes: No results for input(s): CKTOTAL, CKMB, CKMBINDEX, TROPONINI in the last 168 hours. BNP (last 3 results) No results for input(s): PROBNP in the last 8760 hours. HbA1C: No results for input(s): HGBA1C in the last 72 hours. CBG: No results for input(s): GLUCAP in the last 168 hours. Lipid Profile: No results for input(s): CHOL, HDL, LDLCALC, TRIG, CHOLHDL, LDLDIRECT in the last 72  hours. Thyroid Function Tests: No results for input(s): TSH, T4TOTAL, FREET4, T3FREE, THYROIDAB in the last 72 hours. Anemia Panel: No results for input(s): VITAMINB12, FOLATE, FERRITIN, TIBC, IRON, RETICCTPCT in the last 72 hours.  --------------------------------------------------------------------------------------------------------------- Urine analysis:    Component Value Date/Time   COLORURINE YELLOW 09/03/2018 0340   APPEARANCEUR CLOUDY (A) 09/03/2018 0340   LABSPEC 1.027 09/03/2018 0340   PHURINE 5.0 09/03/2018 0340   GLUCOSEU NEGATIVE 09/03/2018 0340   HGBUR NEGATIVE 09/03/2018 0340   BILIRUBINUR NEGATIVE 09/03/2018 0340   KETONESUR NEGATIVE 09/03/2018 0340   PROTEINUR NEGATIVE 09/03/2018 0340   NITRITE NEGATIVE 09/03/2018 0340   LEUKOCYTESUR MODERATE (A) 09/03/2018 0340      Imaging Results:    Dg Chest 1 View  Result Date: 09/03/2018 CLINICAL DATA:  Fever EXAM: CHEST  1 VIEW COMPARISON:  June 23, 2016 FINDINGS: The lung volumes are low. The heart size is enlarged. There are bibasilar airspace opacities favored to represent atelectasis. There are probable bilateral surgical drains. There is no pneumothorax. No acute osseous abnormality. Subcutaneous gas is noted in the patient's chest likely related to the recent surgical intervention. IMPRESSION: 1. Low lung volumes with presumed postoperative atelectasis at the lung bases. 2. No pneumothorax. 3. Cardiomegaly. 4. Bilateral surgical drains, presumably related to the recent surgical  intervention. There is subcutaneous gas overlying the patient's chest wall likely related to the recent surgical intervention. Electronically Signed   By: Constance Holster M.D.   On: 09/03/2018 02:45   Ct Angio Chest Pe W And/or Wo Contrast  Result Date: 09/03/2018 CLINICAL DATA:  Patient with recent bilateral breast reductions. Elevated temperature. Evaluate for pulmonary embolus. EXAM: CT ANGIOGRAPHY CHEST WITH CONTRAST TECHNIQUE: Multidetector CT imaging of the chest was performed using the standard protocol during bolus administration of intravenous contrast. Multiplanar CT image reconstructions and MIPs were obtained to evaluate the vascular anatomy. CONTRAST:  176m OMNIPAQUE IOHEXOL 350 MG/ML SOLN COMPARISON:  Chest CT 07/08/2016 FINDINGS: Cardiovascular: Heart size upper limits of normal. Trace fluid superior pericardial recess. Thoracic aortic vascular calcifications. Adequate opacification of the pulmonary arterial system. Motion artifact limits evaluation particularly of the left lower lobe pulmonary arteries. Within the above limitation, no evidence for acute pulmonary embolus. Mediastinum/Nodes: No enlarged axillary, mediastinal or hilar lymphadenopathy. Moderate-sized hiatal hernia. Lungs/Pleura: Central airways are patent. Patchy ground-glass and consolidative opacities within the right lower lobe with a more triangular base peripheral opacity within the superior segment right lower lobe. No pleural effusion or pneumothorax. Upper Abdomen: No acute process. Musculoskeletal: Thoracic spine degenerative changes. No aggressive or acute appearing osseous lesions. Postsurgical changes within the breast bilaterally compatible with recent reduction surgery. Review of the MIP images confirms the above findings. IMPRESSION: No evidence for acute pulmonary embolus. Patchy ground-glass and consolidative opacities particularly within the right lower lobe which may represent atelectasis. Infection not  excluded. Interval postsurgical changes compatible with bilateral breast reduction surgery. Aortic Atherosclerosis (ICD10-I70.0). Electronically Signed   By: DLovey NewcomerM.D.   On: 09/03/2018 04:17      Assessment & Plan:    Principal Problem:   Pneumonia Active Problems:   Protein-calorie malnutrition, severe (HCC)  Fever, and hypoxia,  ? Hcap Blood culture x2 Urine strep antigen Urine legionella antigen Vanco iv, cefepime iv, pharmacy to dose  Back pain, and leg weakness ? Due to fever Check MRI lumbar spine w/wo contrast  Severe protein calorie malnutrition prostat 375mpo bid  Anemia Check ferritin, iron, tibc, b12, folate, esr Check FOBT , RN to  obtain Check cbc this AM If Hgb <7, will require transfusion  Neuropathy Cont Gabapentin   Anxiety Cont Celexa 42m po qday Cont Xanax  H/o stage 3 endometrial cancer s/p XRT F/u with oncology as outpatient   DVT Prophylaxis-   Lovenox - SCDs    AM Labs Ordered, also please review Full Orders  Family Communication: Admission, patients condition and plan of care including tests being ordered have been discussed with the patient who indicate understanding and agree with the plan and Code Status.  Code Status:  FULL CODE,   Admission status:  Inpatient: Based on patients clinical presentation and evaluation of above clinical data, I have made determination that patient meets Inpatient criteria at this time. Pt has fever, and hypoxia, secondary to Hcap, and will require iv abx, and is requiring o2 [place] to maintain o2 sat >90 %,  Pt has high risk of clinical deterioration,  Pt will be admitted > 2nites stay.   Time spent in minutes : 55   JJani GravelM.D on 09/03/2018 at 6:52 AM

## 2018-09-03 NOTE — ED Notes (Signed)
Pt taken to CT.

## 2018-09-03 NOTE — ED Notes (Signed)
Purwick placed, pt not able to urinate at this time aware that sample is needed.

## 2018-09-03 NOTE — ED Notes (Signed)
Pt given a dinner tray

## 2018-09-03 NOTE — ED Triage Notes (Signed)
Pt BIB [facility] from home, had bilateral breast reductions yesterday, today has had increased weakness and fevers. Tmax at home 102 temporal. Denies increased pain at incision. Spo2 on room air 88%.

## 2018-09-03 NOTE — ED Notes (Signed)
IV team at bedside 

## 2018-09-03 NOTE — ED Provider Notes (Signed)
[facility] EMERGENCY DEPARTMENT Provider Note   CSN: [id number] Arrival date & time: 09/03/18  0006     History   Chief Complaint Chief Complaint  Patient presents with  . Fever    HPI Misty [name] is a 58 y.o. female.     Patient is a 58 year old female with past medical history of hypertension, anxiety, endometrial cancer, and neuropathy secondary to chemotherapy.  She presents today for evaluation of fever and weakness.  Patient underwent breast reduction surgery yesterday performed by Dr. Marla Roe.  This evening she developed weakness and temperature to 102.  According to the family member at bedside, patient has been unable to stand and walk due to feeling weak and lightheaded.  Per EMS, oxygen saturations were 88% initially.  Patient denies chest pain or cough.  She denies vomiting or diarrhea.  The history is provided by the patient.    Past Medical History:  Diagnosis Date  . Ambulates with cane   . Anemia    prior to hysterectomy  . Ankle fracture   . Anxiety   . Aortic atherosclerosis ([place])   . Cardiomegaly    Mild  . Endometrial cancer ([place])   . Family history of brain cancer   . Family history of breast cancer   . Family history of colon cancer   . Family history of colonic polyps   . Family history of prostate cancer   . History of chemotherapy   . History of gallstones   . History of hiatal hernia   . History of radiation therapy 09/07/16-10/07/16   HDR to vaginal vault 30 gy in 5 fractions  . Hypertension    No BP medications, related more to anxiety  . Inguinal hernia    Right  . Neuropathy    Chemo induce, feet and fingers  . Paraumbilical hernia    Small    Patient Active Problem List   Diagnosis Date Noted  . Symptomatic mammary hypertrophy 07/19/2018  . Neck pain 07/19/2018  . Back pain 07/19/2018  . Incarcerated right inguinal hernia 08/20/2017  . 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  Preventive measure 10/09/2016  . Genetic testing 08/28/2016  . Port catheter in place 08/25/2016  . White coat syndrome without diagnosis of hypertension 08/25/2016  . Nausea without vomiting 08/25/2016  . Family history of breast cancer   . Family history of colon cancer   . Family history of colonic polyps   . Family history of brain cancer   . Family history of prostate cancer   . Family history of cancer 07/23/2016  . 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 Endometrial cancer ([place]) 06/17/2016  . Morbid obesity with BMI of 50.0-59.9, adult ([place]) 06/17/2016    Past Surgical History:  Procedure Laterality Date  . ANKLE FRACTURE SURGERY Right   . BREAST REDUCTION SURGERY Bilateral 09/01/2018   Procedure: MAMMARY REDUCTION  (BREAST);  Surgeon: Wallace Going, DO;  Location: [facility];  Service: Plastics;  Laterality: Bilateral;  4 hours, please  . CHOLECYSTECTOMY    . INGUINAL HERNIA REPAIR Right 08/20/2017   Procedure: OPEN RIGHT INGUINAL HERNIA REPAIR ERA PATHWAY;  Surgeon: Alphonsa Overall, MD;  Location: [facility];  Service: General;  Laterality: Right;  . INSERTION OF MESH Right 08/20/2017   Procedure: INSERTION OF MESH;  Surgeon: Alphonsa Overall, MD;  Location: [facility];  Service: General;  Laterality: Right;  .  IR FLUORO GUIDE PORT INSERTION RIGHT  07/31/2016  . IR REMOVAL TUN ACCESS W/ PORT W/O FL MOD SED  01/13/2017  . IR US GUIDE VASC ACCESS RIGHT  07/31/2016  . LAPAROSCOPY N/A 08/20/2017   Procedure: ABDOMINAL LAPAROSCOPY;  Surgeon: Alphonsa Overall, MD;  Location: [facility];  Service: General;  Laterality: N/A;  . LYMPH NODE BIOPSY N/A 06/25/2016   Procedure: LYMPH NODE BIOPSY;  Surgeon: Everitt Amber, MD;  Location: [facility];  Service: Gynecology;  Laterality: N/A;  . ROBOTIC ASSISTED TOTAL HYSTERECTOMY WITH BILATERAL SALPINGO OOPHERECTOMY Bilateral 06/25/2016   Procedure: XI ROBOTIC ASSISTED TOTAL HYSTERECTOMY WITH  BILATERAL SALPINGO OOPHORECTOMY for uterus greater 250 grams ;  Surgeon: Everitt Amber, MD;  Location: [facility];  Service: Gynecology;  Laterality: Bilateral;  . TONSILLECTOMY    . UMBILICAL HERNIA REPAIR N/A 08/20/2017   Procedure: HERNIA REPAIR UMBILICAL ADULT;  Surgeon: Alphonsa Overall, MD;  Location: [facility];  Service: General;  Laterality: N/A;     OB History    Gravida  2   Para  2   Term  2   Preterm      AB      Living  2     SAB      TAB      Ectopic      Multiple      Live Births               Home Medications    Prior to Admission medications   Medication Sig Start Date End Date Taking? Authorizing Provider  acetaminophen (TYLENOL) 500 MG tablet Take 1,000 mg by mouth 3 (three) times daily.     [provider]  ALPRAZolam Duanne Moron) 0.5 MG tablet Take 0.25-0.5 mg by mouth daily as needed for anxiety (FOR PANIC/ANXIETY).     [provider]  b complex vitamins tablet Take 1 tablet by mouth daily at 2 PM.     [provider]  citalopram (CELEXA) 40 MG tablet Take 40 mg daily by mouth. 11/25/16   [provider]  gabapentin (NEURONTIN) 300 MG capsule TAKE 3 CAPSULES (900 MG TOTAL) BY MOUTH 3 (THREE) TIMES DAILY. 11/24/17 08/25/19  Alda Berthold, DO  Multiple Vitamin (MULTIVITAMIN WITH MINERALS) TABS tablet Take 1 tablet by mouth daily at 2 PM.    [provider]  ondansetron (ZOFRAN) 4 MG tablet Take 1 tablet (4 mg total) by mouth every 8 (eight) hours as needed for nausea or vomiting. 08/23/18   Scheeler, Carola Rhine, PA-C    Family History Family History  Problem Relation Age of Onset  . Breast cancer Sister 59  . Colon cancer Paternal Grandfather 31  . Breast cancer Sister 8       Negative genetic testing on a 46 gene panel through [facility]  . Thyroid cancer Sister 73  . Fibroids Mother   . Colon polyps Father   . Healthy Brother   . Breast cancer Maternal Aunt 58  . Prostate cancer Maternal Uncle 74  . Healthy  Paternal Aunt   . Heart disease Maternal Grandmother   . Stroke Paternal Grandmother   . Colon polyps Sister   . Colon polyps Sister   . Healthy Sister   . Healthy Brother   . Brain cancer Maternal Aunt 55       died 3 days after diagnosis  . Breast cancer Maternal Aunt 80  . Healthy Paternal Aunt     Social History Social History   Tobacco  Use  . Smoking status: Never Smoker  . Smokeless tobacco: Never Used  Substance Use Topics  . Alcohol use: Not Currently    Alcohol/week: 1.0 standard drinks    Types: 1 Glasses of wine per week    Frequency: Never  . Drug use: No     Allergies   Patient has no known allergies.   Review of Systems Review of Systems  All other systems reviewed and are negative.    Physical Exam Updated Vital Signs BP (!) 108/57   Pulse 76   Temp 99.7 F (37.6 C) (Oral)   Resp 18   LMP 05/31/2016 (Approximate)   SpO2 98%   Physical Exam Vitals signs and nursing note reviewed.  Constitutional:      General: She is not in acute distress.    Appearance: She is well-developed. She is not diaphoretic.  HENT:     Head: Normocephalic and atraumatic.  Neck:     Musculoskeletal: Normal range of motion and neck supple.  Cardiovascular:     Rate and Rhythm: Normal rate and regular rhythm.     Heart sounds: No murmur. No friction rub. No gallop.   Pulmonary:     Effort: Pulmonary effort is normal. No respiratory distress.     Breath sounds: Normal breath sounds. No wheezing.  Abdominal:     General: Bowel sounds are normal. There is no distension.     Palpations: Abdomen is soft.     Tenderness: There is no abdominal tenderness.  Musculoskeletal: Normal range of motion.  Skin:    General: Skin is warm and dry.     Comments: The incisions below the breast appear to be healing well.  Sutures are in place with no significant drainage, dehiscence, or erythema.  There is serosanguineous drainage in the JP drains located on either side.   Neurological:     Mental Status: She is alert and oriented to person, place, and time.      ED Treatments / Results  Labs (all labs ordered are listed, but only abnormal results are displayed) Labs Reviewed  COMPREHENSIVE METABOLIC PANEL - Abnormal; Notable for the following components:      Result Value   Glucose, Bld 108 (*)    Calcium 7.9 (*)    Total Protein 5.2 (*)    Albumin 2.7 (*)    All other components within normal limits  CBC WITH DIFFERENTIAL/PLATELET - Abnormal; Notable for the following components:   WBC 11.9 (*)    RBC 3.03 (*)    Hemoglobin 7.4 (*)    HCT 25.4 (*)    MCH 24.4 (*)    MCHC 29.1 (*)    RDW 19.3 (*)    Neutro Abs 9.9 (*)    All other components within normal limits  CULTURE, BLOOD (ROUTINE X 2)  CULTURE, BLOOD (ROUTINE X 2)  LACTIC ACID, PLASMA  LACTIC ACID, PLASMA  URINALYSIS, ROUTINE W REFLEX MICROSCOPIC    EKG None  Radiology No results found.  Procedures Procedures (including critical care time)  Medications Ordered in ED Medications  sodium chloride flush (NS) 0.9 % injection 3 mL (3 mLs Intravenous Given 09/03/18 0100)     Initial Impression / Assessment and Plan / ED Course  I have reviewed the triage vital signs and the nursing notes.  Pertinent labs & imaging results that were available during my care of the patient were reviewed by me and considered in my medical decision making (see chart for details).  Patient very of recent bilateral breast reduction surgery presenting with complaints of weakness and shortness of breath.  There was reported low-grade fever and hypoxia prior to presentation, however I have not seen this here.  She does not appear to be in heart failure and I see no sign of pneumonia.  A CT Angio of the chest was obtained to rule out PE.  This was negative.  Patient's work-up reveals a hemoglobin of 7.4 which is down from preoperative of 9.4.  She also has urinalysis suggestive of UTI.  Patient will be  admitted for further observation.  Dr. Maudie Mercury agrees to admit.  Final Clinical Impressions(s) / ED Diagnoses   Final diagnoses:  None    ED Discharge Orders    None       Veryl Speak, MD 09/03/18 [phone]

## 2018-09-03 NOTE — Progress Notes (Signed)
Pharmacy Antibiotic Note  Misty [name] is a 58 y.o. female admitted on 09/03/2018 with pneumonia.  Pharmacy has been consulted for vancomycin and cefepime dosing.  Plan: Vancomycin 2500mg  x1 then 1750mg  IV Q24H. Goal AUC 400-550.  Expected AUC 475.  SCr used 0.83. Cefepime 2g IV Q8H.  Temp (24hrs), Avg:99.7 F (37.6 C), Min:99.7 F (37.6 C), Max:99.7 F (37.6 C)  Recent Labs  Lab 09/01/18 0907 09/03/18 0027 09/03/18 0103  WBC 6.0 11.9*  --   CREATININE 0.57 0.83  --   LATICACIDVEN  --   --  1.0    Estimated Creatinine Clearance: 97.5 mL/min (by C-G formula based on SCr of 0.83 mg/dL).    No Known Allergies   Thank you for allowing pharmacy to be a part of this patient's care.  Wynona Neat, PharmD, BCPS  09/03/2018 6:54 AM

## 2018-09-03 NOTE — Plan of Care (Signed)
  Problem: Clinical Measurements: Goal: Ability to maintain clinical measurements within normal limits will improve Outcome: Progressing   Problem: Education: Goal: Knowledge of General Education information will improve Description: Including pain rating scale, medication(s)/side effects and non-pharmacologic comfort measures Outcome: Not Progressing   Problem: Health Behavior/Discharge Planning: Goal: Ability to manage health-related needs will improve Outcome: Not Progressing   Problem: Clinical Measurements: Goal: Will remain free from infection Outcome: Not Progressing   Problem: Clinical Measurements: Goal: Diagnostic test results will improve Outcome: Not Applicable

## 2018-09-03 NOTE — Progress Notes (Signed)
Patient seen and examined at bedside, patient admitted after midnight, please see earlier detailed admission note by Jani Gravel, MD. Briefly, patient presented with fever and weakness found to have evidence of infection, likely pneumonia with concern for possible COVID vs viral vs atypical bacterial pneumonia. Will give one dose of steroids today but cautious because of recent surgery and delayed wound healing. Patient is on room air so will hold off on Remdesivir at this time. Culture of drains obtained. Blood cultures pending. Updated patient and husband. Looks comfortable at this time.   Cordelia Poche, MD [facility] 09/03/2018, 4:16 PM

## 2018-09-03 NOTE — ED Notes (Signed)
Patient transported to MRI 

## 2018-09-03 NOTE — ED Notes (Signed)
Heart healthy dinner tray ordered 

## 2018-09-04 DIAGNOSIS — J9601 Acute respiratory failure with hypoxia: Secondary | ICD-10-CM

## 2018-09-04 DIAGNOSIS — E43 Unspecified severe protein-calorie malnutrition: Secondary | ICD-10-CM

## 2018-09-04 DIAGNOSIS — J181 Lobar pneumonia, unspecified organism: Secondary | ICD-10-CM

## 2018-09-04 DIAGNOSIS — D649 Anemia, unspecified: Secondary | ICD-10-CM

## 2018-09-04 LAB — CBC
HCT: 24 % — ABNORMAL LOW (ref 36.0–46.0)
Hemoglobin: 6.9 g/dL — CL (ref 12.0–15.0)
MCH: 24.1 pg — ABNORMAL LOW (ref 26.0–34.0)
MCHC: 28.8 g/dL — ABNORMAL LOW (ref 30.0–36.0)
MCV: 83.9 fL (ref 80.0–100.0)
Platelets: 205 10*3/uL (ref 150–400)
RBC: 2.86 MIL/uL — ABNORMAL LOW (ref 3.87–5.11)
RDW: 19.2 % — ABNORMAL HIGH (ref 11.5–15.5)
WBC: 9.2 10*3/uL (ref 4.0–10.5)
nRBC: 0 % (ref 0.0–0.2)

## 2018-09-04 LAB — COMPREHENSIVE METABOLIC PANEL
ALT: 12 U/L (ref 0–44)
AST: 12 U/L — ABNORMAL LOW (ref 15–41)
Albumin: 2.1 g/dL — ABNORMAL LOW (ref 3.5–5.0)
Alkaline Phosphatase: 34 U/L — ABNORMAL LOW (ref 38–126)
Anion gap: 7 (ref 5–15)
BUN: 8 mg/dL (ref 6–20)
CO2: 26 mmol/L (ref 22–32)
Calcium: 7.8 mg/dL — ABNORMAL LOW (ref 8.9–10.3)
Chloride: 107 mmol/L (ref 98–111)
Creatinine, Ser: 0.57 mg/dL (ref 0.44–1.00)
GFR calc Af Amer: >60 mL/min (ref >60)
GFR calc non Af Amer: >60 mL/min (ref >60)
Glucose, Bld: 100 mg/dL — ABNORMAL HIGH (ref 70–99)
Potassium: 3.8 mmol/L (ref 3.5–5.1)
Sodium: 140 mmol/L (ref 135–145)
Total Bilirubin: 0.8 mg/dL (ref 0.3–1.2)
Total Protein: 4.7 g/dL — ABNORMAL LOW (ref 6.5–8.1)

## 2018-09-04 LAB — ABO/RH: ABO/RH(D): B POS

## 2018-09-04 LAB — PROCALCITONIN: Procalcitonin: 0.19 ng/mL

## 2018-09-04 LAB — PREPARE RBC (CROSSMATCH)

## 2018-09-04 LAB — NOVEL CORONAVIRUS, NAA (HOSP ORDER, SEND-OUT TO REF LAB; TAT 18-24 HRS): SARS-CoV-2, NAA: NOT DETECTED

## 2018-09-04 LAB — HIV ANTIBODY (ROUTINE TESTING W REFLEX): HIV Screen 4th Generation wRfx: NONREACTIVE

## 2018-09-04 MED ORDER — ACETAMINOPHEN 325 MG PO TABS
650.0000 mg | ORAL_TABLET | Freq: Four times a day (QID) | ORAL | Status: DC | PRN
  Administered 2018-09-05 – 2018-09-08 (×8): 650 mg via ORAL
  Filled 2018-09-04 (×8): qty 2

## 2018-09-04 MED ORDER — SODIUM CHLORIDE 0.9% IV SOLUTION
Freq: Once | INTRAVENOUS | Status: AC
  Administered 2018-09-04: 12:00:00 via INTRAVENOUS

## 2018-09-04 NOTE — Progress Notes (Signed)
CRITICAL VALUE ALERT  Critical Value:  Hgb 6.9   Date & Time Notied:  09/04/2018 at 0900   Provider Notified: Georgena Spurling   Orders Received/Actions taken: Awaiting response

## 2018-09-04 NOTE — Progress Notes (Signed)
Woke up spontaneously requested water, drank small amount, incontinent of urine-pericare provided, Vital signs obtained.  Patient made comfortable, surgoical site intact without exudate, JP drains intact , emptied of moderate amount serous fluid.

## 2018-09-04 NOTE — Progress Notes (Signed)
Noted to be sleeping on rounds, easily aroused, responsive to voice and stimuli. VSS, afebrile- IV infusing via peripheral line , line intact and patent. Patient with bilateral JP drains. With serous exudate, surgical site intact , erythematous, minimal edema-binder in place. Patient with large soft BM x2 , pericare given per routine. Denies discomfort at present will continue to observe for change.

## 2018-09-04 NOTE — Progress Notes (Signed)
Remains comfortably asleep w/o distress-remains afebrile, IVF continue, Puric to wall suction-amber urine. No further episode of loose stool.

## 2018-09-04 NOTE — Progress Notes (Signed)
PROGRESS NOTE    Misty [name]  [id number] DOB: [birth year] DOA: 09/03/2018 PCP: Shelda Pal, DO   Brief Narrative: Misty [name] is a 58 y.o. female, w hypertension, neuropathy, gerd, who had bilateral breast reduction. Patient presented     Assessment & Plan:   Principal Problem:   Pneumonia Active Problems:   Protein-calorie malnutrition, severe ([place])   Right lower lobe pneumonia COVID-19 negative. Procalcitonin of 3.46 on admission, improved to 0.19. patient with continued fevers but curve is trending down. Blood cultures pending but no growth to date. In setting of recent intubation for surgery. -Continue Vancomycin/cefepime -Blood cultures -Sputum culture  Acute on chronic anemia History of iron deficiency anemia. Likely related to recent blood loss from surgery. Down to 6.9 today. Iron panel suggests iron deficiency. -Transfuse 1 unit of PRBC -CBC in AM -Feraheme prior to discharge  Acute respiratory failure with hypoxia Secondary to pneumonia -Continue to wean to room air  Recent breast reduction surgery Noted. Bilateral drains do not appear to be infected. 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   DVT prophylaxis: SCDs Code Status:   Code Status: Full Code Family Communication: None at bedside Disposition Plan: Discharge likely in 24-48 hours pending clinical improvement   Consultants:   None  Procedures:   None  Antimicrobials:  Vancomycin  Cefepime   Subjective: Breathing is improved from yesterday but not at baseline. Feels better than yesterday overall but not overly well.  Objective: Vitals:   09/03/18 2000 09/03/18 2300 09/04/18 0500 09/04/18 0750  BP: 107/67 123/76 110/72 107/68  Pulse: 65 77 77 74  Resp:    16  Temp: 98.5 F (36.9 C) 99.8 F (37.7 C) 99.7 F (37.6 C)  100.2 F (37.9 C)  TempSrc: Oral Oral Oral Oral  SpO2: 99% 100% 100% 97%  Weight:      Height:        Intake/Output Summary (Last 24 hours) at 09/04/2018 1303 Last data filed at 09/04/2018 1100 Gross per 24 hour  Intake 100 ml  Output 116 ml  Net -16 ml   Filed Weights   09/03/18 1800  Weight: 121.5 kg    Examination:  General exam: Appears calm and comfortable  Respiratory system: Clear to auscultation except with RLL rales. Respiratory effort normal. Cardiovascular system: S1 & S2 heard, RRR. No murmurs, rubs, gallops or clicks. Gastrointestinal system: Abdomen is nondistended, soft and nontender. No organomegaly or masses felt. Normal bowel sounds heard. Central nervous system: Alert and oriented. No focal neurological deficits. Extremities: No edema. No calf tenderness Skin: No cyanosis. No rashes Psychiatry: Judgement and insight appear normal. Mood & affect appropriate.     Data Reviewed: I have personally reviewed following labs and imaging studies  CBC: Recent Labs  Lab 09/01/18 0907 09/03/18 0027 09/03/18 0835 09/04/18 0726  WBC 6.0 11.9* 11.3* 9.2  NEUTROABS  --  9.9*  --   --   HGB 9.4* 7.4* 7.4* 6.9*  HCT 31.8* 25.4* 25.6* 24.0*  MCV 82.4 83.8 82.8 83.9  PLT 248 211 209 [id number]   Basic Metabolic Panel: Recent Labs  Lab 09/01/18 0907 09/03/18 0027 09/04/18 0726  NA 142 137 140  K 4.0 3.8 3.8  CL 110 103 107  CO2 25 26 26   GLUCOSE 81 108* 100*  BUN 11 18 8   CREATININE 0.57 0.83 0.57  CALCIUM 8.6* 7.9* 7.8*  GFR: Estimated Creatinine Clearance: 98.5 mL/min (by C-G formula based on SCr of 0.57 mg/dL). Liver Function Tests: Recent Labs  Lab 09/03/18 0027 09/04/18 0726  AST 16 12*  ALT 13 12  ALKPHOS 40 34*  BILITOT 0.4 0.8  PROT 5.2* 4.7*  ALBUMIN 2.7* 2.1*   No results for input(s): LIPASE, AMYLASE in the last 168 hours. No results for input(s): AMMONIA in the last 168 hours. Coagulation Profile: No results for input(s): INR, PROTIME  in the last 168 hours. Cardiac Enzymes: No results for input(s): CKTOTAL, CKMB, CKMBINDEX, TROPONINI in the last 168 hours. BNP (last 3 results) No results for input(s): PROBNP in the last 8760 hours. HbA1C: No results for input(s): HGBA1C in the last 72 hours. CBG: No results for input(s): GLUCAP in the last 168 hours. Lipid Profile: No results for input(s): CHOL, HDL, LDLCALC, TRIG, CHOLHDL, LDLDIRECT in the last 72 hours. Thyroid Function Tests: No results for input(s): TSH, T4TOTAL, FREET4, T3FREE, THYROIDAB in the last 72 hours. Anemia Panel: Recent Labs    09/03/18 0835  VITAMINB12 1,137*  FERRITIN 14  TIBC 308  IRON 8*   Sepsis Labs: Recent Labs  Lab 09/03/18 0103 09/03/18 0835 09/03/18 1708 09/04/18 0726  PROCALCITON  --   --  3.46 0.19  LATICACIDVEN 1.0 0.9  --   --     Recent Results (from the past 240 hour(s))  SARS CORONAVIRUS 2 Nasal Swab Aptima Multi Swab     Status: None   Collection Time: 08/29/18 12:10 PM   Specimen: Aptima Multi Swab; Nasal Swab  Result Value Ref Range Status   SARS Coronavirus 2 NEGATIVE NEGATIVE Final    Comment: (NOTE) SARS-CoV-2 target nucleic acids are NOT DETECTED. The SARS-CoV-2 RNA is generally detectable in upper and lower respiratory specimens during the acute phase of infection. Negative results do not preclude SARS-CoV-2 infection, do not rule out co-infections with other pathogens, and should not be used as the sole basis for treatment or other patient management decisions. Negative results must be combined with clinical observations, patient history, and epidemiological information. The expected result is Negative. Fact Sheet for Patients: SugarRoll.be Fact Sheet for Healthcare Providers: https://www.woods-mathews.com/ This test is not yet approved or cleared by the [place] FDA and  has been authorized for detection and/or diagnosis of SARS-CoV-2 by FDA under an  Emergency Use Authorization (EUA). This EUA will remain  in effect (meaning this test can be used) for the duration of the COVID-19 declaration under Section 56 4(b)(1) of the Act, 21 U.S.C. section 360bbb-3(b)(1), unless the authorization is terminated or revoked sooner. Performed at [facility], [place] [address] [place], [place] [address]   Blood culture (routine x 2)     Status: None (Preliminary result)   Collection Time: 09/03/18  1:55 AM   Specimen: BLOOD RIGHT HAND  Result Value Ref Range Status   Specimen Description BLOOD RIGHT HAND  Final   Special Requests   Final    BOTTLES DRAWN AEROBIC AND ANAEROBIC Blood Culture results may not be optimal due to an inadequate volume of blood received in culture bottles   Culture   Final    NO GROWTH 1 DAY Performed at [facility], [place] [address], [place], [place] [address]    Report Status PENDING  Incomplete  Blood culture (routine x 2)     Status: None (Preliminary result)   Collection Time: 09/03/18  2:20 AM   Specimen: BLOOD  Result Value Ref Range  Status   Specimen Description BLOOD LEFT WRIST  Final   Special Requests   Final    BOTTLES DRAWN AEROBIC AND ANAEROBIC Blood Culture results may not be optimal due to an inadequate volume of blood received in culture bottles   Culture   Final    NO GROWTH 1 DAY Performed at [facility], [place] [address] [place], [place] [address]    Report Status PENDING  Incomplete  Aerobic/Anaerobic Culture (surgical/deep wound)     Status: None (Preliminary result)   Collection Time: 09/03/18  8:10 AM   Specimen: Wound  Result Value Ref Range Status   Specimen Description WOUND RIGHT DRAIN  Final   Special Requests NONE  Final   Gram Stain   Final    MODERATE WBC PRESENT,BOTH PMN AND MONONUCLEAR NO ORGANISMS SEEN    Culture   Final    NO GROWTH 1 DAY Performed at [facility], [place] [address] [place], [place] [address]    Report Status PENDING  Incomplete    Aerobic/Anaerobic Culture (surgical/deep wound)     Status: None (Preliminary result)   Collection Time: 09/03/18  8:20 AM   Specimen: Wound  Result Value Ref Range Status   Specimen Description WOUND LEFT DRAIN  Final   Special Requests NONE  Final   Gram Stain   Final    FEW WBC PRESENT,BOTH PMN AND MONONUCLEAR NO ORGANISMS SEEN    Culture   Final    NO GROWTH 1 DAY Performed at [facility], [place] [address], [place], [place] [address]    Report Status PENDING  Incomplete  Novel Coronavirus, NAA (hospital order; send-out to ref lab)     Status: None   Collection Time: 09/03/18  8:26 AM  Result Value Ref Range Status   SARS-CoV-2, NAA NOT DETECTED NOT DETECTED Final    Comment: (NOTE) This test was developed and its performance characteristics determined by [employer]. This test has not been FDA cleared or approved. This test has been authorized by FDA under an Emergency Use Authorization (EUA). This test is only authorized for the duration of time the declaration that circumstances exist justifying the authorization of the emergency use of in vitro diagnostic tests for detection of SARS-CoV-2 virus and/or diagnosis of COVID-19 infection under section 564(b)(1) of the Act, 21 U.S.C. [id number]), unless the authorization is terminated or revoked sooner. When diagnostic testing is negative, the possibility of a false negative result should be considered in the context of a patient's recent exposures and the presence of clinical signs and symptoms consistent with COVID-19. An individual without symptoms of COVID-19 and who is not shedding SARS-CoV-2 virus would expect to have a negative (not detected) result in this assay. Performed  At: [facility] [address] [place], [place] [id number] Rush Farmer MD [id number]    [place]  Final    Comment: Performed at [facility], [place] [address] [place], [place]  [address]         Radiology Studies: Dg Chest 1 View  Result Date: 09/03/2018 CLINICAL DATA:  Fever EXAM: CHEST  1 VIEW COMPARISON:  June 23, 2016 FINDINGS: The lung volumes are low. The heart size is enlarged. There are bibasilar airspace opacities favored to represent atelectasis. There are probable bilateral surgical drains. There is no pneumothorax. No acute osseous abnormality. Subcutaneous gas is noted in the patient's chest likely related to the recent surgical intervention. IMPRESSION: 1. Low lung volumes  with presumed postoperative atelectasis at the lung bases. 2. No pneumothorax. 3. Cardiomegaly. 4. Bilateral surgical drains, presumably related to the recent surgical intervention. There is subcutaneous gas overlying the patient's chest wall likely related to the recent surgical intervention. Electronically Signed   By: Constance Holster M.D.   On: 09/03/2018 02:45   Ct Angio Chest Pe W And/or Wo Contrast  Result Date: 09/03/2018 CLINICAL DATA:  Patient with recent bilateral breast reductions. Elevated temperature. Evaluate for pulmonary embolus. EXAM: CT ANGIOGRAPHY CHEST WITH CONTRAST TECHNIQUE: Multidetector CT imaging of the chest was performed using the standard protocol during bolus administration of intravenous contrast. Multiplanar CT image reconstructions and MIPs were obtained to evaluate the vascular anatomy. CONTRAST:  143mL OMNIPAQUE IOHEXOL 350 MG/ML SOLN COMPARISON:  Chest CT 07/08/2016 FINDINGS: Cardiovascular: Heart size upper limits of normal. Trace fluid superior pericardial recess. Thoracic aortic vascular calcifications. Adequate opacification of the pulmonary arterial system. Motion artifact limits evaluation particularly of the left lower lobe pulmonary arteries. Within the above limitation, no evidence for acute pulmonary embolus. Mediastinum/Nodes: No enlarged axillary, mediastinal or hilar lymphadenopathy. Moderate-sized hiatal hernia. Lungs/Pleura: Central airways are  patent. Patchy ground-glass and consolidative opacities within the right lower lobe with a more triangular base peripheral opacity within the superior segment right lower lobe. No pleural effusion or pneumothorax. Upper Abdomen: No acute process. Musculoskeletal: Thoracic spine degenerative changes. No aggressive or acute appearing osseous lesions. Postsurgical changes within the breast bilaterally compatible with recent reduction surgery. Review of the MIP images confirms the above findings. IMPRESSION: No evidence for acute pulmonary embolus. Patchy ground-glass and consolidative opacities particularly within the right lower lobe which may represent atelectasis. Infection not excluded. Interval postsurgical changes compatible with bilateral breast reduction surgery. Aortic Atherosclerosis (ICD10-I70.0). Electronically Signed   By: Lovey Newcomer M.D.   On: 09/03/2018 04:17   Mr Lumbar Spine W Wo Contrast  Result Date: 09/03/2018 CLINICAL DATA:  Breast reduction surgery yesterday. Fever. Hypoxia. Back pain. EXAM: MRI LUMBAR SPINE WITHOUT AND WITH CONTRAST TECHNIQUE: Multiplanar and multiecho pulse sequences of the lumbar spine were obtained without and with intravenous contrast. CONTRAST:  10 mL Gadavist COMPARISON:  None. FINDINGS: Segmentation: 5 non rib-bearing lumbar type vertebral bodies are present. The lowest fully formed vertebral body is L5. Alignment: Normal lumbar lordosis is present. There is slight retrolisthesis at L1-2 and L2-3. Minimal anterolisthesis is present at L4-5. Leftward curvature is centered at L4. Vertebrae: Mild edematous endplate marrow changes are present at L2-3. There is some endplate enhancement. No associated disc edema or enhancement is present. Marrow signal and vertebral body heights are otherwise normal. Conus medullaris and cauda equina: Conus extends to the L1-2 level. Conus and cauda equina appear normal. Paraspinal and other soft tissues: Limited imaging the abdomen is  unremarkable. There is no significant adenopathy. No solid organ lesions are present. Disc levels: L1-2: Mild disc bulging and facet hypertrophy is present without significant stenosis. L2-3: A broad-based disc bulge is present. Moderate facet hypertrophy is noted bilaterally. Mild left foraminal narrowing is present. L3-4: A far left lateral disc protrusion is present. Mild facet hypertrophy is noted. Disc extends into left neural foramen without significant stenosis. L4-5: There is uncovering of a mild broad-based disc protrusion. Moderate facet hypertrophy is present with effusions bilaterally. Disc extends into the foramina bilaterally without significant stenosis. L5-S1: Negative. Other than mild endplate degenerative enhancement at L2-3, there is no pathologic enhancement in the lumbar spine. IMPRESSION: 1. No focal lesion or enhancement to explain the patient's  fever. 2. Mild degenerative changes in the lumbar spine as described without significant stenosis apart from minimal left foraminal narrowing at L2-3. Electronically Signed   By: San Morelle M.D.   On: 09/03/2018 11:01      Scheduled Meds:  citalopram  40 mg Oral Daily   enoxaparin (LOVENOX) injection  60 mg Subcutaneous Q24H   gabapentin  900 mg Oral TID   ondansetron  4 mg Oral Q6H   Continuous Infusions:  ceFEPime (MAXIPIME) IV 2 g (09/04/18 0538)   vancomycin 1,750 mg (09/04/18 0644)     LOS: 1 day     Cordelia Poche, MD [facility] 09/04/2018, 1:03 PM  If 7PM-7AM, please contact night-coverage [email or web address]

## 2018-09-05 DIAGNOSIS — D509 Iron deficiency anemia, unspecified: Secondary | ICD-10-CM

## 2018-09-05 LAB — BASIC METABOLIC PANEL
Anion gap: 8 (ref 5–15)
BUN: 8 mg/dL (ref 6–20)
CO2: 28 mmol/L (ref 22–32)
Calcium: 7.8 mg/dL — ABNORMAL LOW (ref 8.9–10.3)
Chloride: 103 mmol/L (ref 98–111)
Creatinine, Ser: 0.51 mg/dL (ref 0.44–1.00)
GFR calc Af Amer: >60 mL/min (ref >60)
GFR calc non Af Amer: >60 mL/min (ref >60)
Glucose, Bld: 150 mg/dL — ABNORMAL HIGH (ref 70–99)
Potassium: 3.6 mmol/L (ref 3.5–5.1)
Sodium: 139 mmol/L (ref 135–145)

## 2018-09-05 LAB — CBC
HCT: 26.3 % — ABNORMAL LOW (ref 36.0–46.0)
Hemoglobin: 7.8 g/dL — ABNORMAL LOW (ref 12.0–15.0)
MCH: 24.8 pg — ABNORMAL LOW (ref 26.0–34.0)
MCHC: 29.7 g/dL — ABNORMAL LOW (ref 30.0–36.0)
MCV: 83.8 fL (ref 80.0–100.0)
Platelets: 212 10*3/uL (ref 150–400)
RBC: 3.14 MIL/uL — ABNORMAL LOW (ref 3.87–5.11)
RDW: 18.3 % — ABNORMAL HIGH (ref 11.5–15.5)
WBC: 7.6 10*3/uL (ref 4.0–10.5)
nRBC: 0 % (ref 0.0–0.2)

## 2018-09-05 LAB — PROCALCITONIN: Procalcitonin: 0.1 ng/mL

## 2018-09-05 LAB — BPAM RBC
Blood Product Expiration Date: 202009042359
ISSUE DATE / TIME: 202008231328
Unit Type and Rh: 7300

## 2018-09-05 LAB — MRSA PCR SCREENING: MRSA by PCR: NEGATIVE

## 2018-09-05 LAB — TYPE AND SCREEN
ABO/RH(D): B POS
Antibody Screen: NEGATIVE
Unit division: 0

## 2018-09-05 LAB — FOLATE RBC
Folate, Hemolysate: 367 ng/mL
Folate, RBC: 1529 ng/mL (ref >498)
Hematocrit: 24 % — ABNORMAL LOW (ref 34.0–46.6)

## 2018-09-05 LAB — LEGIONELLA PNEUMOPHILA SEROGP 1 UR AG: L. pneumophila Serogp 1 Ur Ag: NEGATIVE

## 2018-09-05 NOTE — Evaluation (Signed)
Physical Therapy Evaluation Patient Details Name: Misty [name] MRN: [id number] DOB: 07/17/1960 Today's Date: 09/05/2018   History of Present Illness  Misty [name] is a 58 y.o. female, w hypertension, neuropathy, gerd, who had bilateral breast reduction now presents with c/o fever to 102, as well as weakness and hypoxia.  Also had recent hernia surgery and has h/o endometrial CA.  Clinical Impression  Patient presents with decreased independence with mobility due to generalized weakness, decreased balance with 3 falls at home prior to admission and poor activity tolerance.  She will benefit from skilled PT in the acute setting to allow return home with family support following STSNF level rehab stay.     Follow Up Recommendations SNF    Equipment Recommendations  3in1 (PT)(for shower seat)    Recommendations for Other Services       Precautions / Restrictions Precautions Precautions: Fall Precaution Comments: bilateral drains      Mobility  Bed Mobility Overal bed mobility: Needs Assistance Bed Mobility: Supine to Sit     Supine to sit: Min assist     General bed mobility comments: assist and increased time to get hips scooted to EOB  Transfers Overall transfer level: Needs assistance Equipment used: Rolling walker (2 wheeled) Transfers: Sit to/from [facility] Sit to Stand: Min assist Stand pivot transfers: Min assist       General transfer comment: increased time and pt endorses anxiety, cues throughout and able to stand from EOB and use RW to step around to recliner landing with uncontrolled descent  Ambulation/Gait                Stairs            Wheelchair Mobility    Modified Rankin (Stroke Patients Only)       Balance Overall balance assessment: Needs assistance   Sitting balance-Leahy Scale: Fair     Standing balance support: Bilateral upper extremity supported Standing balance-Leahy Scale: Poor Standing  balance comment: reliant on UE support, stood for hygiene as purewick leaked onto bed                             Pertinent Vitals/Pain Pain Assessment: 0-10 Pain Score: 4  Pain Location: feet/legs Pain Descriptors / Indicators: Tingling Pain Intervention(s): Monitored during session;Repositioned    Home Living Family/patient expects to be discharged to:: Private residence Living Arrangements: Spouse/significant other;Children(son)   Type of Home: House Home Access: Stairs to enter Entrance Stairs-Rails: Right Entrance Stairs-Number of Steps: 4-5 Home Layout: Two level Home Equipment: Environmental consultant - 2 wheels;Cane - single point;Grab bars - tub/shower;Toilet riser Additional Comments: only used toilet riser after hernia surgery    Prior Function Level of Independence: Independent;Needs assistance         Comments: did not use the cane in the house, was doing the elliptical at home every other day, uses the walker on days goes to doctor cause has to take anxiety medication and uses it to get out of the shower     Hand Dominance        Extremity/Trunk Assessment   Upper Extremity Assessment Upper Extremity Assessment: Generalized weakness    Lower Extremity Assessment Lower Extremity Assessment: Generalized weakness(neuropathy up to above knees)       Communication   Communication: No difficulties  Cognition Arousal/Alertness: Awake/alert Behavior During Therapy: WFL for tasks assessed/performed Overall Cognitive Status: Within Functional Limits for tasks assessed  General Comments General comments (skin integrity, edema, etc.): wearing binder over breasts and tucked drains into binder to transition to chair    Exercises     Assessment/Plan    PT Assessment Patient needs continued PT services  PT Problem List Decreased strength;Decreased balance;Decreased mobility;Decreased knowledge of use of  DME;Decreased activity tolerance;Pain       PT Treatment Interventions DME instruction;Stair training;Gait training;Functional mobility training;Therapeutic exercise;Patient/family education;Balance training;Therapeutic activities    PT Goals (Current goals can be found in the Care Plan section)  Acute Rehab PT Goals Patient Stated Goal: to get better before home PT Goal Formulation: With patient Time For Goal Achievement: 09/19/18 Potential to Achieve Goals: Good    Frequency Min 3X/week   Barriers to discharge        Co-evaluation               AM-PAC PT "6 Clicks" Mobility  Outcome Measure Help needed turning from your back to your side while in a flat bed without using bedrails?: A Little Help needed moving from lying on your back to sitting on the side of a flat bed without using bedrails?: A Little Help needed moving to and from a bed to a chair (including a wheelchair)?: A Little Help needed standing up from a chair using your arms (e.g., wheelchair or bedside chair)?: A Little Help needed to walk in hospital room?: A Lot Help needed climbing 3-5 steps with a railing? : Total 6 Click Score: 15    End of Session Equipment Utilized During Treatment: Gait belt Activity Tolerance: Patient limited by fatigue Patient left: in chair;with call bell/phone within reach Nurse Communication: Mobility status PT Visit Diagnosis: Muscle weakness (generalized) (M62.81);History of falling (Z91.81);Difficulty in walking, not elsewhere classified (R26.2)    Time: [id number] PT Time Calculation (min) (ACUTE ONLY): 31 min   Charges:   PT Evaluation $PT Eval Moderate Complexity: 1 Mod PT Treatments $Therapeutic Activity: 8-22 mins        Magda Kiel, [place] [facility] [phone] 09/05/2018   Reginia Naas 09/05/2018, 5:37 PM

## 2018-09-05 NOTE — Progress Notes (Signed)
PROGRESS NOTE    Misty [name]  [id number] DOB: 02-27-1960 DOA: 09/03/2018 PCP: Shelda Pal, DO   Brief Narrative: Misty [name] is a 58 y.o. female, w hypertension, neuropathy, gerd, who had bilateral breast reduction. Patient presented     Assessment & Plan:   Principal Problem:   Pneumonia Active Problems:   Protein-calorie malnutrition, severe ([place])   Right lower lobe pneumonia COVID-19 negative. Procalcitonin of 3.46 on admission, improved to 0.19 and now down to 0.1. Patient with elevated temperatures but no fevers. Blood cultures pending but no growth to date (x2 days). In setting of recent intubation for surgery. -Continue Vancomycin/cefepime and pending MRSA screen, will transition to oral Levaquin -Sputum culture  Acute on chronic anemia History of iron deficiency anemia. Likely related to recent blood loss from surgery. Down to 6.9 on 8/23 and is s/p 1 unit of PRBC. Iron panel suggests iron deficiency. -CBC pending -Feraheme prior to discharge  Acute respiratory failure with hypoxia Secondary to pneumonia -Continue to wean to room air  Recent breast reduction surgery Noted. Bilateral drains do not appear to be infected. 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   DVT prophylaxis: SCDs Code Status:   Code Status: Full Code Family Communication: None at bedside Disposition Plan: Discharge likely in 24-48 hours pending clinical improvement   Consultants:   None  Procedures:   None  Antimicrobials:  Vancomycin  Cefepime   Subjective: Breathing is improved from yesterday but not at baseline. Feels better than yesterday overall but not overly well.  Objective: Vitals:   09/04/18 1330 09/04/18 1716 09/04/18 2133 09/05/18 0758  BP: 112/64 (!) 113/52 137/85 105/65  Pulse: 72 67 73 84  Resp:  18   19  Temp: 99 F (37.2 C) 98.1 F (36.7 C) 98.9 F (37.2 C) 98.6 F (37 C)  TempSrc: Oral Oral Oral Oral  SpO2: 100% 95% 96% 97%  Weight:      Height:        Intake/Output Summary (Last 24 hours) at 09/05/2018 1118 Last data filed at 09/05/2018 0630 Gross per 24 hour  Intake 715 ml  Output 760 ml  Net -45 ml   Filed Weights   09/03/18 1800  Weight: 121.5 kg    Examination:  General exam: Appears calm and comfortable Respiratory system: Diminished breath sounds. Respiratory effort normal. Cardiovascular system: S1 & S2 heard, RRR. No murmurs, rubs, gallops or clicks. Gastrointestinal system: Abdomen is nondistended, soft and nontender. No organomegaly or masses felt. Normal bowel sounds heard. Central nervous system: Alert and oriented. No focal neurological deficits. Extremities: No edema. No calf tenderness Skin: No cyanosis. No rashes Psychiatry: Judgement and insight appear normal. Mood & affect appropriate.     Data Reviewed: I have personally reviewed following labs and imaging studies  CBC: Recent Labs  Lab 09/01/18 0907 09/03/18 0027 09/03/18 0835 09/04/18 0726 09/05/18 0945  WBC 6.0 11.9* 11.3* 9.2 7.6  NEUTROABS  --  9.9*  --   --   --   HGB 9.4* 7.4* 7.4* 6.9* 7.8*  HCT 31.8* 25.4* 25.6* 24.0* 26.3*  MCV 82.4 83.8 82.8 83.9 83.8  PLT 248 211 209 205 [id number]   Basic Metabolic Panel: Recent Labs  Lab 09/01/18 0907 09/03/18 0027 09/04/18 0726 09/05/18 0945  NA 142 137 140 139  K 4.0 3.8 3.8 3.6  CL 110 103 107 103  CO2 25 26 26 28   GLUCOSE 81 108* 100* 150*  BUN 11 18 8 8   CREATININE 0.57 0.83 0.57 0.51  CALCIUM 8.6* 7.9* 7.8* 7.8*   GFR: Estimated Creatinine Clearance: 98.5 mL/min (by C-G formula based on SCr of 0.51 mg/dL). Liver Function Tests: Recent Labs  Lab 09/03/18 0027 09/04/18 0726  AST 16 12*  ALT 13 12  ALKPHOS 40 34*  BILITOT 0.4 0.8  PROT 5.2* 4.7*  ALBUMIN 2.7* 2.1*   No results for input(s): LIPASE, AMYLASE in  the last 168 hours. No results for input(s): AMMONIA in the last 168 hours. Coagulation Profile: No results for input(s): INR, PROTIME in the last 168 hours. Cardiac Enzymes: No results for input(s): CKTOTAL, CKMB, CKMBINDEX, TROPONINI in the last 168 hours. BNP (last 3 results) No results for input(s): PROBNP in the last 8760 hours. HbA1C: No results for input(s): HGBA1C in the last 72 hours. CBG: No results for input(s): GLUCAP in the last 168 hours. Lipid Profile: No results for input(s): CHOL, HDL, LDLCALC, TRIG, CHOLHDL, LDLDIRECT in the last 72 hours. Thyroid Function Tests: No results for input(s): TSH, T4TOTAL, FREET4, T3FREE, THYROIDAB in the last 72 hours. Anemia Panel: Recent Labs    09/03/18 0835  VITAMINB12 1,137*  FERRITIN 14  TIBC 308  IRON 8*   Sepsis Labs: Recent Labs  Lab 09/03/18 0103 09/03/18 0835 09/03/18 1708 09/04/18 0726 09/05/18 0450  PROCALCITON  --   --  3.46 0.19 0.10  LATICACIDVEN 1.0 0.9  --   --   --     Recent Results (from the past 240 hour(s))  SARS CORONAVIRUS 2 Nasal Swab Aptima Multi Swab     Status: None   Collection Time: 08/29/18 12:10 PM   Specimen: Aptima Multi Swab; Nasal Swab  Result Value Ref Range Status   SARS Coronavirus 2 NEGATIVE NEGATIVE Final    Comment: (NOTE) SARS-CoV-2 target nucleic acids are NOT DETECTED. The SARS-CoV-2 RNA is generally detectable in upper and lower respiratory specimens during the acute phase of infection. Negative results do not preclude SARS-CoV-2 infection, do not rule out co-infections with other pathogens, and should not be used as the sole basis for treatment or other patient management decisions. Negative results must be combined with clinical observations, patient history, and epidemiological information. The expected result is Negative. Fact Sheet for Patients: SugarRoll.be Fact Sheet for Healthcare Providers:  https://www.woods-mathews.com/ This test is not yet approved or cleared by the [place] FDA and  has been authorized for detection and/or diagnosis of SARS-CoV-2 by FDA under an Emergency Use Authorization (EUA). This EUA will remain  in effect (meaning this test can be used) for the duration of the COVID-19 declaration under Section 56 4(b)(1) of the Act, 21 U.S.C. section 360bbb-3(b)(1), unless the authorization is terminated or revoked sooner. Performed at [facility], [place] [address] [place], [place] [address]   Blood culture (routine x 2)     Status: None (Preliminary result)   Collection Time: 09/03/18  1:55 AM   Specimen: BLOOD RIGHT HAND  Result Value Ref Range Status   Specimen Description BLOOD RIGHT HAND  Final   Special Requests   Final    BOTTLES DRAWN AEROBIC AND ANAEROBIC Blood Culture results may not be optimal due to an inadequate volume of blood received in culture bottles   Culture   Final    NO GROWTH 2 DAYS Performed at [facility], [place] [address] [place], [place] [address]  Report Status PENDING  Incomplete  Blood culture (routine x 2)     Status: None (Preliminary result)   Collection Time: 09/03/18  2:20 AM   Specimen: BLOOD  Result Value Ref Range Status   Specimen Description BLOOD LEFT WRIST  Final   Special Requests   Final    BOTTLES DRAWN AEROBIC AND ANAEROBIC Blood Culture results may not be optimal due to an inadequate volume of blood received in culture bottles   Culture   Final    NO GROWTH 2 DAYS Performed at [facility], [place] [address] [place], [place] [address]    Report Status PENDING  Incomplete  Aerobic/Anaerobic Culture (surgical/deep wound)     Status: None (Preliminary result)   Collection Time: 09/03/18  8:10 AM   Specimen: Wound  Result Value Ref Range Status   Specimen Description WOUND RIGHT DRAIN  Final   Special Requests NONE  Final   Gram Stain   Final    MODERATE WBC PRESENT,BOTH  PMN AND MONONUCLEAR NO ORGANISMS SEEN    Culture   Final    NO GROWTH 1 DAY Performed at [facility], [place] [address], [place], [place] [address]    Report Status PENDING  Incomplete  Aerobic/Anaerobic Culture (surgical/deep wound)     Status: None (Preliminary result)   Collection Time: 09/03/18  8:20 AM   Specimen: Wound  Result Value Ref Range Status   Specimen Description WOUND LEFT DRAIN  Final   Special Requests NONE  Final   Gram Stain   Final    FEW WBC PRESENT,BOTH PMN AND MONONUCLEAR NO ORGANISMS SEEN    Culture   Final    NO GROWTH 1 DAY Performed at [facility], [place] [address] [place], [place] [address]    Report Status PENDING  Incomplete  Novel Coronavirus, NAA (hospital order; send-out to ref lab)     Status: None   Collection Time: 09/03/18  8:26 AM  Result Value Ref Range Status   SARS-CoV-2, NAA NOT DETECTED NOT DETECTED Final    Comment: (NOTE) This test was developed and its performance characteristics determined by [employer]. This test has not been FDA cleared or approved. This test has been authorized by FDA under an Emergency Use Authorization (EUA). This test is only authorized for the duration of time the declaration that circumstances exist justifying the authorization of the emergency use of in vitro diagnostic tests for detection of SARS-CoV-2 virus and/or diagnosis of COVID-19 infection under section 564(b)(1) of the Act, 21 U.S.C. [id number]), unless the authorization is terminated or revoked sooner. When diagnostic testing is negative, the possibility of a false negative result should be considered in the context of a patient's recent exposures and the presence of clinical signs and symptoms consistent with COVID-19. An individual without symptoms of COVID-19 and who is not shedding SARS-CoV-2 virus would expect to have a negative (not detected) result in this assay. Performed  At: [facility] [address] [place], [place] [id number] Rush Farmer MD [id number]    [facility]  Final    Comment: Performed at [facility], [place] [address] [place], [place] [address]         Radiology Studies: No results found.    Scheduled Meds: . citalopram  40 mg Oral Daily  . gabapentin  900 mg Oral TID  . ondansetron  4 mg Oral Q6H   Continuous Infusions: . ceFEPime (MAXIPIME) IV 2 g (  09/05/18 0504)  . vancomycin 1,750 mg (09/05/18 0630)     LOS: 2 days     Cordelia Poche, MD [facility] 09/05/2018, 11:18 AM  If 7PM-7AM, please contact night-coverage [email or web address]

## 2018-09-05 NOTE — Progress Notes (Signed)
[facility]  Subjective: Misty [name] is a 58 year old female who underwent a bilateral breast reduction on 09/01/2018 with Dr. Marla Roe.  On 09/02/2018 her husband called the office and reported that she was having some episodes of dizziness which resulted in her needing assistance being lowered to the ground.  EMS evaluated the patient and her husband stated she was stable per EMS and did not need to be evaluated in the ED.  Prior to the dizziness he reported that she had very little oral fluid and food intake.   On the evening of 09/03/18 patient's husband reported she developed weakness and had a temp of 102. At the ED,  Neg CT angio for PE, CXR showed presumed postoperative atelectasis.  Since admission from ED, Tmax 100.6, no other measurable fevers. Temp of 100.6 measured on 09/03/18. WBC today 7.6, down from 9.2 yesterday.  Today on exam she is resting in bed. No complaints. Pain adequately controlled.   Patient has bilateral breast drains in place, yesterday daily total output of 160 cc. Today on evaluation, serosanguinous fluid in both drains. L>R. Incisions are c/d/i, no sign of infection, dehiscence.  Patient is passing flatus and has had multiple BM's since surgery.  Objective: Vital signs in last 24 hours: Temp:  [98.1 F (36.7 C)-98.9 F (37.2 C)] 98.6 F (37 C) (08/24 0758) Pulse Rate:  [67-84] 84 (08/24 0758) Resp:  [19] 19 (08/24 0758) BP: (105-137)/(52-85) 105/65 (08/24 0758) SpO2:  [95 %-97 %] 97 % (08/24 0758) Last BM Date: 09/04/18  Intake/Output from previous day: 08/23 0701 - 08/24 0700 In: 715 [P.O.:300; Blood:315; IV Piggyback:100] Out: 761 [Urine:600; Drains:160; Stool:1] Intake/Output this shift: No intake/output data recorded.  General appearance: alert, cooperative, no distress and resting in bed Head: Normocephalic, without obvious abnormality, atraumatic Resp: symmetric rise and fall, unlabored breathing Breasts: Breast  reduction incisions c/d/i, no drainage, dermabond in place. No erythema. Extremities: extremities normal, atraumatic, no cyanosis or edema Pulses: 2+ and symmetric Skin: Skin color, texture, turgor normal. No rashes or lesions Incision/Wound: Drains in place bilaterally. Serosanguinous fluid in bulbs.   Lab Results:  CBC Latest Ref Rng & Units 09/05/2018 09/04/2018 09/03/2018  WBC 4.0 - 10.5 K/uL 7.6 9.2 11.3(H)  Hemoglobin 12.0 - 15.0 g/dL 7.8(L) 6.9(LL) 7.4(L)  Hematocrit 36.0 - 46.0 % 26.3(L) 24.0(L) 25.6(L)  Platelets 150 - 400 K/uL 212 205 209    BMET Recent Labs    09/04/18 0726 09/05/18 0945  NA 140 139  K 3.8 3.6  CL 107 103  CO2 26 28  GLUCOSE 100* 150*  BUN 8 8  CREATININE 0.57 0.51  CALCIUM 7.8* 7.8*   PT/INR No results for input(s): LABPROT, INR in the last 72 hours. ABG No results for input(s): PHART, HCO3 in the last 72 hours.  Invalid input(s): PCO2, PO2  Studies/Results: No results found.  Anti-infectives: Anti-infectives (From admission, onward)   Start     Dose/Rate Route Frequency Ordered Stop   09/04/18 0600  vancomycin (VANCOCIN) 1,750 mg in sodium chloride 0.9 % 500 mL IVPB     1,750 mg 250 mL/hr over 120 Minutes Intravenous Every 24 hours 09/03/18 0653     09/03/18 1400  ceFEPIme (MAXIPIME) 2 g in sodium chloride 0.9 % 100 mL IVPB     2 g 200 mL/hr over 30 Minutes Intravenous Every 8 hours 09/03/18 0653     09/03/18 0700  vancomycin (VANCOCIN) 2,500 mg in sodium chloride 0.9 % 500 mL IVPB  2,500 mg 250 mL/hr over 120 Minutes Intravenous  Once 09/03/18 0649 09/03/18 1232   09/03/18 0700  ceFEPIme (MAXIPIME) 2 g in sodium chloride 0.9 % 100 mL IVPB     2 g 200 mL/hr over 30 Minutes Intravenous  Once 09/03/18 0649 09/03/18 0838      Assessment/Plan: POD#4 from bilateral breast reduction.   Patient doing well from plastic surgery stand point. Incisions are c/d/i, healing nicely. Dermabond in place. No sign of skin/soft tissue infection.  Continue wearing breast binder daily. Can remove breast binder for short periods of time throughout day if causing difficulty breathing.  Drains in place - will remove in the office on post-op follow up. Pain adequately controlled on gabapentin, APAP, tramadol.  Continue use of incentive spirometer. Continue eating healthy, drinking.  Appreciate medicine management. Patient to have PT evaluation.  Dressing changes PRN due to level of soil on ABD pads.   Call with questions or concerns, will update family.   LOS: 2 days    Charlies Constable, PA-C 09/05/2018

## 2018-09-06 LAB — BASIC METABOLIC PANEL
Anion gap: 9 (ref 5–15)
BUN: 8 mg/dL (ref 6–20)
CO2: 28 mmol/L (ref 22–32)
Calcium: 8.2 mg/dL — ABNORMAL LOW (ref 8.9–10.3)
Chloride: 99 mmol/L (ref 98–111)
Creatinine, Ser: 0.47 mg/dL (ref 0.44–1.00)
GFR calc Af Amer: >60 mL/min (ref >60)
GFR calc non Af Amer: >60 mL/min (ref >60)
Glucose, Bld: 97 mg/dL (ref 70–99)
Potassium: 4.1 mmol/L (ref 3.5–5.1)
Sodium: 136 mmol/L (ref 135–145)

## 2018-09-06 LAB — CBC
HCT: 28.8 % — ABNORMAL LOW (ref 36.0–46.0)
Hemoglobin: 8.6 g/dL — ABNORMAL LOW (ref 12.0–15.0)
MCH: 24.8 pg — ABNORMAL LOW (ref 26.0–34.0)
MCHC: 29.9 g/dL — ABNORMAL LOW (ref 30.0–36.0)
MCV: 83 fL (ref 80.0–100.0)
Platelets: 235 10*3/uL (ref 150–400)
RBC: 3.47 MIL/uL — ABNORMAL LOW (ref 3.87–5.11)
RDW: 18.5 % — ABNORMAL HIGH (ref 11.5–15.5)
WBC: 7.3 10*3/uL (ref 4.0–10.5)
nRBC: 0 % (ref 0.0–0.2)

## 2018-09-06 MED ORDER — LEVOFLOXACIN 500 MG PO TABS
750.0000 mg | ORAL_TABLET | Freq: Every day | ORAL | Status: DC
  Administered 2018-09-06 – 2018-09-07 (×2): 750 mg via ORAL
  Filled 2018-09-06 (×2): qty 2

## 2018-09-06 MED ORDER — ONDANSETRON HCL 4 MG PO TABS
4.0000 mg | ORAL_TABLET | Freq: Four times a day (QID) | ORAL | Status: DC | PRN
  Administered 2018-09-07 – 2018-09-08 (×3): 4 mg via ORAL
  Filled 2018-09-06 (×3): qty 1

## 2018-09-06 MED ORDER — SODIUM CHLORIDE 0.9 % IV SOLN
510.0000 mg | Freq: Once | INTRAVENOUS | Status: AC
  Administered 2018-09-06: 510 mg via INTRAVENOUS
  Filled 2018-09-06: qty 17

## 2018-09-06 MED ORDER — MAGNESIUM HYDROXIDE 400 MG/5ML PO SUSP
15.0000 mL | Freq: Every day | ORAL | Status: DC | PRN
  Administered 2018-09-06 – 2018-09-07 (×2): 15 mL via ORAL
  Filled 2018-09-06 (×4): qty 30

## 2018-09-06 NOTE — Progress Notes (Signed)
Pt given Incentive Spirometer. Pt taught how to use and performed teach back with this RN. Pt successful with teach back and technique. Will monitor use.   Pt weaned to RA. Pt O2 sats temporarily dropped to 90 but recovered with deep breathing, Will cont to monitor.

## 2018-09-06 NOTE — NC FL2 (Signed)
[place] MEDICAID FL2 LEVEL OF CARE SCREENING TOOL     IDENTIFICATION  Patient Name: Misty [name] Birthdate: 04-27-1960 Sex: female Admission Date (Current Location): 09/03/2018  [facility] and [place] Number:  Herbalist and Address:  [facility], [place] [address] [place], [place] [address]      Provider Number: [id number]  Attending Physician Name and Address:  Mariel Aloe, MD  Relative Name and Phone Number:       Current Level of Care: Hospital Recommended Level of Care: [place] Prior Approval Number:    Date Approved/Denied:   PASRR Number: [id number]  Discharge Plan: SNF    Current Diagnoses: Patient Active Problem List   Diagnosis Date Noted  . Pneumonia 09/03/2018  . Protein-calorie malnutrition, severe ([place]) 09/03/2018  . Symptomatic mammary hypertrophy 07/19/2018  . Neck pain 07/19/2018  . Back pain 07/19/2018  . Incarcerated right inguinal hernia 08/20/2017  . Peripheral neuropathy due to chemotherapy ([place]) 10/09/2016  . Preventive measure 10/09/2016  . Genetic testing 08/28/2016  . Port catheter in place 08/25/2016  . White coat syndrome without diagnosis of hypertension 08/25/2016  . Nausea without vomiting 08/25/2016  . Family history of breast cancer   . Family history of colon cancer   . Family history of colonic polyps   . Family history of brain cancer   . Family history of prostate cancer   . Family history of cancer 07/23/2016  . 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 Endometrial cancer ([place]) 06/17/2016  . Morbid obesity with BMI of 50.0-59.9, adult ([place]) 06/17/2016    Orientation RESPIRATION BLADDER Height & Weight     Self, Time, Situation, Place  Normal Continent Weight: 267 lb 13.7 oz (121.5 kg) Height:  5\' 4"  (162.6 cm)  BEHAVIORAL SYMPTOMS/MOOD NEUROLOGICAL BOWEL NUTRITION STATUS   Continent Diet(see DC Summary)  AMBULATORY STATUS COMMUNICATION OF NEEDS Skin   Limited Assist Verbally Surgical wounds, Other (Comment)(closed breast wounds, JP drains placed at both breast sites)                       Personal Care Assistance Level of Assistance  Bathing, Feeding, Dressing Bathing Assistance: Limited assistance Feeding assistance: Independent Dressing Assistance: Limited assistance     Functional Limitations Info  Sight, Hearing, Speech Sight Info: Adequate Hearing Info: Adequate Speech Info: Adequate    SPECIAL CARE FACTORS FREQUENCY  PT (By licensed PT), OT (By licensed OT)     PT Frequency: 5x/wk OT Frequency: 5x/wk            Contractures Contractures Info: Not present    Additional Factors Info  Code Status, Allergies, Psychotropic Code Status Info: Full Allergies Info: NKA Psychotropic Info: Celexa 40mg  daily         Current Medications (09/06/2018):  This is the current hospital active medication list Current Facility-Administered Medications  Medication Dose Route Frequency Provider Last Rate Last Dose  . acetaminophen (TYLENOL) tablet 650 mg  650 mg Oral Q6H PRN Mariel Aloe, MD   650 mg at 09/05/18 1707  . ALPRAZolam Duanne Moron) tablet 0.25-0.5 mg  0.25-0.5 mg Oral Daily PRN Jani Gravel, MD      . citalopram (CELEXA) tablet 40 mg  40 mg Oral Daily Jani Gravel, MD   40 mg at 09/06/18 [id number]  . ferumoxytol (FERAHEME) 510 mg in sodium chloride 0.9 % 100 mL IVPB  510 mg Intravenous Once Mariel Aloe, MD 468 mL/hr at 09/06/18 1110 510 mg at 09/06/18 1110  . gabapentin (NEURONTIN) capsule 900 mg  900 mg Oral TID Jani Gravel, MD   900 mg at 09/06/18 [id number]  . levofloxacin (LEVAQUIN) tablet 750 mg  750 mg Oral Daily Mariel Aloe, MD      . magnesium hydroxide (MILK OF MAGNESIA) suspension 15 mL  15 mL Oral Daily PRN Mariel Aloe, MD      . ondansetron [facility]) tablet 4 mg  4 mg Oral Q6H PRN Mariel Aloe, MD      . traMADol Veatrice Bourbon)  tablet 50-100 mg  50-100 mg Oral Q6H PRN Mariel Aloe, MD   50 mg at 09/06/18 [id number]     Discharge Medications: Please see discharge summary for a list of discharge medications.  Relevant Imaging Results:  Relevant Lab Results:   Additional Information SS#: [id number]  Geralynn Ochs, LCSW

## 2018-09-06 NOTE — Progress Notes (Signed)
PROGRESS NOTE    Misty [name]  [id number] DOB: 1960/03/18 DOA: 09/03/2018 PCP: Shelda Pal, DO   Brief Narrative: Misty [name] is a 58 y.o. female, w hypertension, neuropathy, gerd, who had bilateral breast reduction. Patient presented secondary to dyspnea with concern for pneumonia. COVID-19 testing negative. Patient on empiric antibiotics and improving. Received 1 unit of PRBC for presumed acute blood loss anemia from recent surgery. Plan for SNF discharge.    Assessment & Plan:   Principal Problem:   Pneumonia Active Problems:   Protein-calorie malnutrition, severe ([place])   Right lower lobe pneumonia COVID-19 negative. Procalcitonin of 3.46 on admission, improved to 0.19 and now down to 0.1. Patient with elevated temperatures but no fevers. Blood cultures pending but no growth to date (x2 days). In setting of recent intubation for surgery. Sputum culture still pending. MRSA pcr negative. -Switch to Levaquin PO -Sputum culture pending  Acute on chronic anemia History of iron deficiency anemia. Likely related to recent blood loss from surgery. Down to 6.9 on 8/23 and is s/p 1 unit of PRBC. Iron panel suggests iron deficiency. CBC stable. -Will give one dose of Feraheme today  Acute respiratory failure with hypoxia Secondary to pneumonia -Continue to wean to room air  Recent breast reduction surgery Noted. Bilateral drains do not appear to be infected. 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   DVT prophylaxis: SCDs Code Status:   Code Status: Full Code Family Communication: None at bedside Disposition Plan: Discharge likely in 24 hours pending transition to room air and pending SNF bed availability   Consultants:   None  Procedures:   None  Antimicrobials:   Vancomycin  Cefepime   Subjective: Feels much better from presentation.  Objective: Vitals:   09/05/18 0758 09/05/18 1753 09/06/18 0022 09/06/18 0754  BP: 105/65 126/75 132/80 116/77  Pulse: 84 62 61 63  Resp: 19 19  14   Temp: 98.6 F (37 C) (!) 97.5 F (36.4 C) 98.4 F (36.9 C) 98.3 F (36.8 C)  TempSrc: Oral Oral Oral Oral  SpO2: 97% 98% 96% 98%  Weight:      Height:        Intake/Output Summary (Last 24 hours) at 09/06/2018 0949 Last data filed at 09/06/2018 0526 Gross per 24 hour  Intake 300 ml  Output 2305 ml  Net -2005 ml   Filed Weights   09/03/18 1800  Weight: 121.5 kg    Examination:  General exam: Appears calm and comfortable Respiratory system: diminished to auscultation. Respiratory effort normal. Cardiovascular system: S1 & S2 heard, RRR. No murmurs, rubs, gallops or clicks. Gastrointestinal system: Abdomen is nondistended, soft and nontender. No organomegaly or masses felt. Normal bowel sounds heard. Central nervous system: Alert and oriented. No focal neurological deficits. Extremities: No edema. No calf tenderness Skin: No cyanosis. No rashes Psychiatry: Judgement and insight appear normal. Mood & affect appropriate.     Data Reviewed: I have personally reviewed following labs and imaging studies  CBC: Recent Labs  Lab 09/03/18 0027 09/03/18 0835 09/04/18 0726 09/05/18 0945 09/06/18 0636  WBC 11.9* 11.3* 9.2 7.6 7.3  NEUTROABS 9.9*  --   --   --   --   HGB 7.4* 7.4* 6.9* 7.8* 8.6*  HCT 25.4* 25.6*  24.0* 24.0* 26.3* 28.8*  MCV 83.8 82.8 83.9 83.8 83.0  PLT 211 209  205 212 [id number]   Basic Metabolic Panel: Recent Labs  Lab 09/01/18 0907 09/03/18 0027 09/04/18 0726 09/05/18 0945 09/06/18 0636  NA 142 137 140 139 136  K 4.0 3.8 3.8 3.6 4.1  CL 110 103 107 103 99  CO2 25 26 26 28 28   GLUCOSE 81 108* 100* 150* 97  BUN 11 18 8 8 8   CREATININE 0.57 0.83 0.57 0.51 0.47  CALCIUM 8.6* 7.9* 7.8* 7.8* 8.2*   GFR: Estimated Creatinine  Clearance: 98.5 mL/min (by C-G formula based on SCr of 0.47 mg/dL). Liver Function Tests: Recent Labs  Lab 09/03/18 0027 09/04/18 0726  AST 16 12*  ALT 13 12  ALKPHOS 40 34*  BILITOT 0.4 0.8  PROT 5.2* 4.7*  ALBUMIN 2.7* 2.1*   No results for input(s): LIPASE, AMYLASE in the last 168 hours. No results for input(s): AMMONIA in the last 168 hours. Coagulation Profile: No results for input(s): INR, PROTIME in the last 168 hours. Cardiac Enzymes: No results for input(s): CKTOTAL, CKMB, CKMBINDEX, TROPONINI in the last 168 hours. BNP (last 3 results) No results for input(s): PROBNP in the last 8760 hours. HbA1C: No results for input(s): HGBA1C in the last 72 hours. CBG: No results for input(s): GLUCAP in the last 168 hours. Lipid Profile: No results for input(s): CHOL, HDL, LDLCALC, TRIG, CHOLHDL, LDLDIRECT in the last 72 hours. Thyroid Function Tests: No results for input(s): TSH, T4TOTAL, FREET4, T3FREE, THYROIDAB in the last 72 hours. Anemia Panel: No results for input(s): VITAMINB12, FOLATE, FERRITIN, TIBC, IRON, RETICCTPCT in the last 72 hours. Sepsis Labs: Recent Labs  Lab 09/03/18 0103 09/03/18 0835 09/03/18 1708 09/04/18 0726 09/05/18 0450  PROCALCITON  --   --  3.46 0.19 0.10  LATICACIDVEN 1.0 0.9  --   --   --     Recent Results (from the past 240 hour(s))  SARS CORONAVIRUS 2 Nasal Swab Aptima Multi Swab     Status: None   Collection Time: 08/29/18 12:10 PM   Specimen: Aptima Multi Swab; Nasal Swab  Result Value Ref Range Status   SARS Coronavirus 2 NEGATIVE NEGATIVE Final    Comment: (NOTE) SARS-CoV-2 target nucleic acids are NOT DETECTED. The SARS-CoV-2 RNA is generally detectable in upper and lower respiratory specimens during the acute phase of infection. Negative results do not preclude SARS-CoV-2 infection, do not rule out co-infections with other pathogens, and should not be used as the sole basis for treatment or other patient management decisions.  Negative results must be combined with clinical observations, patient history, and epidemiological information. The expected result is Negative. Fact Sheet for Patients: SugarRoll.be Fact Sheet for Healthcare Providers: https://www.woods-mathews.com/ This test is not yet approved or cleared by the [place] FDA and  has been authorized for detection and/or diagnosis of SARS-CoV-2 by FDA under an Emergency Use Authorization (EUA). This EUA will remain  in effect (meaning this test can be used) for the duration of the COVID-19 declaration under Section 56 4(b)(1) of the Act, 21 U.S.C. section 360bbb-3(b)(1), unless the authorization is terminated or revoked sooner. Performed at [facility], [place] [address] [place], [place] [address]   Blood culture (routine x 2)     Status: None (Preliminary result)   Collection Time: 09/03/18  1:55 AM   Specimen: BLOOD RIGHT HAND  Result Value Ref Range Status   Specimen Description BLOOD RIGHT HAND  Final   Special Requests   Final    BOTTLES DRAWN AEROBIC AND ANAEROBIC Blood Culture results  may not be optimal due to an inadequate volume of blood received in culture bottles   Culture   Final    NO GROWTH 2 DAYS Performed at [facility], [place] [address], [place], [place] [address]    Report Status PENDING  Incomplete  Blood culture (routine x 2)     Status: None (Preliminary result)   Collection Time: 09/03/18  2:20 AM   Specimen: BLOOD  Result Value Ref Range Status   Specimen Description BLOOD LEFT WRIST  Final   Special Requests   Final    BOTTLES DRAWN AEROBIC AND ANAEROBIC Blood Culture results may not be optimal due to an inadequate volume of blood received in culture bottles   Culture   Final    NO GROWTH 2 DAYS Performed at [facility], [place] [address] [place], [place] [address]    Report Status PENDING  Incomplete  Aerobic/Anaerobic Culture (surgical/deep wound)      Status: None (Preliminary result)   Collection Time: 09/03/18  8:10 AM   Specimen: Wound  Result Value Ref Range Status   Specimen Description WOUND RIGHT DRAIN  Final   Special Requests NONE  Final   Gram Stain   Final    MODERATE WBC PRESENT,BOTH PMN AND MONONUCLEAR NO ORGANISMS SEEN    Culture   Final    NO GROWTH 2 DAYS NO ANAEROBES ISOLATED; CULTURE IN PROGRESS FOR 5 DAYS Performed at [facility], [place] [address], [place], [place] [address]    Report Status PENDING  Incomplete  Aerobic/Anaerobic Culture (surgical/deep wound)     Status: None (Preliminary result)   Collection Time: 09/03/18  8:20 AM   Specimen: Wound  Result Value Ref Range Status   Specimen Description WOUND LEFT DRAIN  Final   Special Requests NONE  Final   Gram Stain   Final    FEW WBC PRESENT,BOTH PMN AND MONONUCLEAR NO ORGANISMS SEEN    Culture   Final    NO GROWTH 2 DAYS NO ANAEROBES ISOLATED; CULTURE IN PROGRESS FOR 5 DAYS Performed at [facility], [place] [address] [place], [place] [address]    Report Status PENDING  Incomplete  Novel Coronavirus, NAA (hospital order; send-out to ref lab)     Status: None   Collection Time: 09/03/18  8:26 AM  Result Value Ref Range Status   SARS-CoV-2, NAA NOT DETECTED NOT DETECTED Final    Comment: (NOTE) This test was developed and its performance characteristics determined by [employer]. This test has not been FDA cleared or approved. This test has been authorized by FDA under an Emergency Use Authorization (EUA). This test is only authorized for the duration of time the declaration that circumstances exist justifying the authorization of the emergency use of in vitro diagnostic tests for detection of SARS-CoV-2 virus and/or diagnosis of COVID-19 infection under section 564(b)(1) of the Act, 21 U.S.C. [id number]), unless the authorization is terminated or revoked sooner. When diagnostic testing is negative, the possibility of a false  negative result should be considered in the context of a patient's recent exposures and the presence of clinical signs and symptoms consistent with COVID-19. An individual without symptoms of COVID-19 and who is not shedding SARS-CoV-2 virus would expect to have a negative (not detected) result in this assay. Performed  At: [facility] [address] [place], [place] [id number] Rush Farmer MD [id number]    [place]  Final    Comment: Performed at  [facility], [place] [address] [place], [place] [address]  MRSA PCR Screening     Status: None   Collection Time: 09/05/18 11:20 AM   Specimen: Nasopharyngeal  Result Value Ref Range Status   MRSA by PCR NEGATIVE NEGATIVE Final    Comment:        The GeneXpert MRSA Assay (FDA approved for NASAL specimens only), is one component of a comprehensive MRSA colonization surveillance program. It is not intended to diagnose MRSA infection nor to guide or monitor treatment for MRSA infections. Performed at [facility], [place] [address], [place], [place] [address]          Radiology Studies: No results found.    Scheduled Meds: . citalopram  40 mg Oral Daily  . gabapentin  900 mg Oral TID  . ondansetron  4 mg Oral Q6H   Continuous Infusions: . ceFEPime (MAXIPIME) IV 2 g (09/06/18 0925)     LOS: 3 days     Cordelia Poche, MD [facility] 09/06/2018, 9:49 AM  If 7PM-7AM, please contact night-coverage [email or web address]

## 2018-09-06 NOTE — Progress Notes (Signed)
Pharmacy Antibiotic Note  Misty [name] is a 58 y.o. female admitted on 09/03/2018 with pneumonia.  Pharmacy has been consulted for cefepime dosing - day #4. SCr stable wnl.  Plan: Cefepime 2g IV Q8H Monitor clinical progress, c/s, renal function F/u de-escalation plan/LOT   Temp (24hrs), Avg:98.1 F (36.7 C), Min:97.5 F (36.4 C), Max:98.4 F (36.9 C)  Recent Labs  Lab 09/01/18 0907 09/03/18 0027 09/03/18 0103 09/03/18 0835 09/04/18 0726 09/05/18 0945 09/06/18 0636  WBC 6.0 11.9*  --  11.3* 9.2 7.6 7.3  CREATININE 0.57 0.83  --   --  0.57 0.51 0.47  LATICACIDVEN  --   --  1.0 0.9  --   --   --     Estimated Creatinine Clearance: 98.5 mL/min (by C-G formula based on SCr of 0.47 mg/dL).    No Known Allergies  Elicia Lamp, PharmD, BCPS Please check AMION for all [place] contact numbers Clinical Pharmacist 09/06/2018 9:21 AM

## 2018-09-06 NOTE — TOC Initial Note (Addendum)
Transition of Care [facility]) - Initial/Assessment Note    Patient Details  Name: Misty [name] MRN: [id number] Date of Birth: 07/21/1960  Transition of Care [facility]) CM/SW Contact:    Geralynn Ochs, LCSW Phone Number: 09/06/2018, 11:24 AM  Clinical Narrative:   CSW met with patient to discuss recommendation for SNF. Patient in agreement, says going home right now would not go well. Patient discussed how she had gone home after her surgery and had fallen multiple times and it was difficult for her husband and her adult son to get her up, so she knows that she needs some help. Patient began to cry acknowledging that she feels so weak and unable to do for herself, wants to be able to be mostly independent again. CSW unsure of SNFs in network with [employer], will send out referral and follow back up with patient on options. Patient agreeable to plan and appreciative of CSW assistance.           UPDATE 3:24 PM: CSW received information from [facility] on the SNF options that are in network. CSW gave information to patient, with information on which SNFs had offered a bed so far.          Expected Discharge Plan: Skilled Nursing Facility Barriers to Discharge: Continued Medical Work up, Ship broker   Patient Goals and CMS Choice Patient states their goals for this hospitalization and ongoing recovery are:: to be able to take care of herself again without needing help CMS Medicare.gov Compare Post Acute Care list provided to:: Patient Choice offered to / list presented to : Patient  Expected Discharge Plan and Services Expected Discharge Plan: [place] Choice: [place] arrangements for the past 2 months: Single Family Home                                      Prior Living Arrangements/Services Living arrangements for the past 2 months: Single Family Home Lives with:: Adult Children, Spouse Patient language and  need for interpreter reviewed:: No        Need for Family Participation in Patient Care: No (Comment) Care giver support system in place?: No (comment) Current home services: DME Criminal Activity/Legal Involvement Pertinent to Current Situation/Hospitalization: No - Comment as needed  Activities of Daily Living Home Assistive Devices/Equipment: Cane (specify quad or straight), Walker (specify type)(walker to stablize getting out of shower) ADL Screening (condition at time of admission) Patient's cognitive ability adequate to safely complete daily activities?: Yes Is the patient deaf or have difficulty hearing?: No Does the patient have difficulty seeing, even when wearing glasses/contacts?: No Does the patient have difficulty concentrating, remembering, or making decisions?: No Patient able to express need for assistance with ADLs?: Yes Does the patient have difficulty dressing or bathing?: No Independently performs ADLs?: Yes (appropriate for developmental age) Does the patient have difficulty walking or climbing stairs?: Yes Weakness of Legs: Both Weakness of Arms/Hands: Both  Permission Sought/Granted Permission sought to share information with : Facility Art therapist granted to share information with : Yes, Verbal Permission Granted     Permission granted to share info w AGENCY: SNF        Emotional Assessment Appearance:: Appears stated age Attitude/Demeanor/Rapport: Engaged Affect (typically observed): Pleasant Orientation: : Oriented to Self, Oriented to Place, Oriented to  Time, Oriented to Situation Alcohol / Substance  Use: Not Applicable    Admission diagnosis:  Weakness [R53.1] H/O bilateral breast reduction surgery [Z98.890] Symptomatic anemia [D64.9] Pneumonia [J18.9] Patient Active Problem List   Diagnosis Date Noted  . Pneumonia 09/03/2018  . Protein-calorie malnutrition, severe ([place]) 09/03/2018  . Symptomatic mammary hypertrophy  07/19/2018  . Neck pain 07/19/2018  . Back pain 07/19/2018  . Incarcerated right inguinal hernia 08/20/2017  . Peripheral neuropathy due to chemotherapy ([place]) 10/09/2016  . Preventive measure 10/09/2016  . Genetic testing 08/28/2016  . Port catheter in place 08/25/2016  . White coat syndrome without diagnosis of hypertension 08/25/2016  . Nausea without vomiting 08/25/2016  . Family history of breast cancer   . Family history of colon cancer   . Family history of colonic polyps   . Family history of brain cancer   . Family history of prostate cancer   . Family history of cancer 07/23/2016  . 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 Endometrial cancer ([place]) 06/17/2016  . Morbid obesity with BMI of 50.0-59.9, adult ([place]) 06/17/2016   PCP:  Shelda Pal, DO Pharmacy:   [id number] [place]Phone: [phone]: [phone]    Social Determinants of Health (SDOH) Interventions    Readmission Risk Interventions No flowsheet data found.

## 2018-09-06 NOTE — Evaluation (Signed)
Occupational Therapy Evaluation Patient Details Name: Misty [name] MRN: [id number] DOB: 05/28/1960 Today's Date: 09/06/2018    History of Present Illness Misty [name] is a 58 y.o. female, w hypertension, neuropathy, gerd, who had bilateral breast reduction now presents with c/o fever to 102, as well as weakness and hypoxia.  Also had recent hernia surgery and has h/o endometrial CA.   Clinical Impression   Patient presenting with decreased I in self care, balance, functional mobility/transfers, endurance,strengthening, and safety awareness. Patient reports being mod I PTA. Patient currently functioning min - max A. Pt needing min - mod A for functional transfers with use of RW. Pt very anxious with mobility. Patient will benefit from acute OT to increase overall independence in the areas of ADLs, functional mobility, and safety awareness in order to safely discharge to next venue of care.    Follow Up Recommendations  SNF;Supervision/Assistance - 24 hour    Equipment Recommendations  Other (comment)(defer to next venue of care)    Recommendations for Other Services Other (comment)(neuropsych visit)     Precautions / Restrictions Precautions Precautions: Fall Precaution Comments: bilateral drains      Mobility Bed Mobility Overal bed mobility: Needs Assistance Bed Mobility: Sit to Supine       Sit to supine: Mod assist   General bed mobility comments: mod A to get B LEs into bed  Transfers Overall transfer level: Needs assistance Equipment used: Rolling walker (2 wheeled) Transfers: Sit to/from [facility] Sit to Stand: Mod assist;Min assist Stand pivot transfers: Min assist       General transfer comment: mod A to power up into stand and min A with functional transfer    Balance Overall balance assessment: Needs assistance   Sitting balance-Leahy Scale: Fair     Standing balance support: Bilateral upper extremity supported Standing  balance-Leahy Scale: Poor Standing balance comment: reliant on UE support, stood for hygiene as purewick leaked onto bed         ADL either performed or assessed with clinical judgement   ADL Overall ADL's : Needs assistance/impaired Eating/Feeding: Independent   Grooming: Wash/dry hands;Wash/dry face;Oral care;Applying deodorant;Brushing hair;Sitting;Set up   Upper Body Bathing: Minimal assistance;Sitting   Lower Body Bathing: Moderate assistance;Sit to/from stand   Upper Body Dressing : Minimal assistance;Sitting   Lower Body Dressing: Maximal assistance;Sit to/from stand   Toilet Transfer: Moderate assistance;Requires drop arm;BSC Toilet Transfer Details (indicate cue type and reason): mod lifting assistance Toileting- Clothing Manipulation and Hygiene: Maximal assistance Toileting - Clothing Manipulation Details (indicate cue type and reason): assistance with clothing management and hygiene       General ADL Comments: very anxious with mobility requiring mod multimodal cuing for sequencing     Vision Baseline Vision/History: Wears glasses Wears Glasses: At all times Patient Visual Report: No change from baseline              Pertinent Vitals/Pain Pain Assessment: 0-10 Pain Score: 4  Pain Location: feet/legs Pain Descriptors / Indicators: Tingling Pain Intervention(s): Monitored during session;Repositioned     Hand Dominance Right   Extremity/Trunk Assessment Upper Extremity Assessment Upper Extremity Assessment: Generalized weakness   Lower Extremity Assessment Lower Extremity Assessment: Generalized weakness       Communication Communication Communication: No difficulties   Cognition Arousal/Alertness: Awake/alert Behavior During Therapy: Anxious Overall Cognitive Status: Within Functional Limits for tasks assessed    General Comments: very anxious with mobility and emotional during assessment  Home Living Family/patient expects  to be discharged to:: Private residence Living Arrangements: Spouse/significant other;Children(adult son)   Type of Home: House Home Access: Stairs to enter Technical brewer of Steps: 4-5 Entrance Stairs-Rails: Right Home Layout: Two level Alternate Level Stairs-Number of Steps: can stay on main in extra bedroom   Bathroom Shower/Tub: Occupational psychologist: [place]: Environmental consultant - 2 wheels;Cane - single point;Grab bars - tub/shower;Toilet riser   Additional Comments: only used toilet riser after hernia surgery      Prior Functioning/Environment Level of Independence: Independent;Needs assistance        Comments: did not use the cane in the house, was doing the elliptical at home every other day, uses the walker on days goes to doctor cause has to take anxiety medication and uses it to get out of the shower        OT Problem List: Decreased strength;Decreased activity tolerance;Impaired balance (sitting and/or standing);Decreased safety awareness;Pain      OT Treatment/Interventions: Self-care/ADL training;Modalities;Balance training;Therapeutic exercise;Neuromuscular education;Therapeutic activities;Energy conservation;DME and/or AE instruction;Patient/family education    OT Goals(Current goals can be found in the care plan section) Acute Rehab OT Goals Patient Stated Goal: to get better before home OT Goal Formulation: With patient Time For Goal Achievement: 09/20/18 Potential to Achieve Goals: Good ADL Goals Pt Will Perform Upper Body Bathing: with supervision Pt Will Perform Lower Body Bathing: with supervision Pt Will Perform Upper Body Dressing: with supervision Pt Will Perform Lower Body Dressing: with supervision;with adaptive equipment Pt Will Transfer to Toilet: with supervision Pt Will Perform Toileting - Clothing Manipulation and hygiene: with supervision Pt Will Perform Tub/Shower Transfer: with min guard assist;shower  seat;rolling walker  OT Frequency: Min 2X/week   Barriers to D/C: Other (comment)  none known at this time          AM-PAC OT "6 Clicks" Daily Activity     Outcome Measure Help from another person eating meals?: None Help from another person taking care of personal grooming?: A Little Help from another person toileting, which includes using toliet, bedpan, or urinal?: A Lot Help from another person bathing (including washing, rinsing, drying)?: A Lot Help from another person to put on and taking off regular upper body clothing?: A Little Help from another person to put on and taking off regular lower body clothing?: A Lot 6 Click Score: 16   End of Session Equipment Utilized During Treatment: Rolling walker(BSC) Nurse Communication: Mobility status;Precautions  Activity Tolerance: Patient tolerated treatment well Patient left: in bed;with call bell/phone within reach  OT Visit Diagnosis: Unsteadiness on feet (R26.81);Repeated falls (R29.6);Muscle weakness (generalized) (M62.81);History of falling (Z91.81)                Time: [id number] OT Time Calculation (min): 23 min Charges:  OT General Charges $OT Visit: 1 Visit OT Evaluation $OT Eval Moderate Complexity: 1 Mod OT Treatments $Self Care/Home Management : 8-22 mins  Obe Ahlers P, MS, OTR/L 09/06/2018, 11:07 AM

## 2018-09-07 DIAGNOSIS — G620 Drug-induced polyneuropathy: Secondary | ICD-10-CM

## 2018-09-07 LAB — CBC WITH DIFFERENTIAL/PLATELET
Abs Immature Granulocytes: 0.33 10*3/uL — ABNORMAL HIGH (ref 0.00–0.07)
Basophils Absolute: 0.1 10*3/uL (ref 0.0–0.1)
Basophils Relative: 1 %
Eosinophils Absolute: 0.4 10*3/uL (ref 0.0–0.5)
Eosinophils Relative: 6 %
HCT: 29.7 % — ABNORMAL LOW (ref 36.0–46.0)
Hemoglobin: 8.8 g/dL — ABNORMAL LOW (ref 12.0–15.0)
Immature Granulocytes: 5 %
Lymphocytes Relative: 16 %
Lymphs Abs: 1.1 10*3/uL (ref 0.7–4.0)
MCH: 24.8 pg — ABNORMAL LOW (ref 26.0–34.0)
MCHC: 29.6 g/dL — ABNORMAL LOW (ref 30.0–36.0)
MCV: 83.7 fL (ref 80.0–100.0)
Monocytes Absolute: 0.8 10*3/uL (ref 0.1–1.0)
Monocytes Relative: 11 %
Neutro Abs: 4.3 10*3/uL (ref 1.7–7.7)
Neutrophils Relative %: 61 %
Platelets: 254 10*3/uL (ref 150–400)
RBC: 3.55 MIL/uL — ABNORMAL LOW (ref 3.87–5.11)
RDW: 19.1 % — ABNORMAL HIGH (ref 11.5–15.5)
WBC: 7 10*3/uL (ref 4.0–10.5)
nRBC: 0.3 % — ABNORMAL HIGH (ref 0.0–0.2)

## 2018-09-07 LAB — COMPREHENSIVE METABOLIC PANEL
ALT: 16 U/L (ref 0–44)
AST: 12 U/L — ABNORMAL LOW (ref 15–41)
Albumin: 2.1 g/dL — ABNORMAL LOW (ref 3.5–5.0)
Alkaline Phosphatase: 46 U/L (ref 38–126)
Anion gap: 11 (ref 5–15)
BUN: 9 mg/dL (ref 6–20)
CO2: 29 mmol/L (ref 22–32)
Calcium: 8.8 mg/dL — ABNORMAL LOW (ref 8.9–10.3)
Chloride: 99 mmol/L (ref 98–111)
Creatinine, Ser: 0.48 mg/dL (ref 0.44–1.00)
GFR calc Af Amer: >60 mL/min (ref >60)
GFR calc non Af Amer: >60 mL/min (ref >60)
Glucose, Bld: 90 mg/dL (ref 70–99)
Potassium: 4.4 mmol/L (ref 3.5–5.1)
Sodium: 139 mmol/L (ref 135–145)
Total Bilirubin: 0.3 mg/dL (ref 0.3–1.2)
Total Protein: 5.4 g/dL — ABNORMAL LOW (ref 6.5–8.1)

## 2018-09-07 LAB — MAGNESIUM: Magnesium: 1.7 mg/dL (ref 1.7–2.4)

## 2018-09-07 LAB — PHOSPHORUS: Phosphorus: 4.1 mg/dL (ref 2.5–4.6)

## 2018-09-07 NOTE — Progress Notes (Signed)
Physical Therapy Treatment Patient Details Name: Misty [name] MRN: [id number] DOB: 03-09-1960 Today's Date: 09/07/2018    History of Present Illness Lexianna Breitzman is a 58 y.o. female, w hypertension, neuropathy, gerd, who had bilateral breast reduction now presents with c/o fever to 102, as well as weakness and hypoxia.  Also had recent hernia surgery and has h/o endometrial CA.    PT Comments    Patient progressing with ambulation tolerance and safety and with less assist for transfers.  Seems less anxious as well.  Feel remains appropriate for SNF level rehab.  Currently min A overall.  PT to follow until d/c.    Follow Up Recommendations  SNF     Equipment Recommendations  3in1 (PT)    Recommendations for Other Services       Precautions / Restrictions Precautions Precautions: Fall Precaution Comments: bilateral drains Restrictions Weight Bearing Restrictions: No    Mobility  Bed Mobility Overal bed mobility: Needs Assistance Bed Mobility: Supine to Sit     Supine to sit: Supervision;HOB elevated     General bed mobility comments: able to sit up with HOB elevated using rails  Transfers Overall transfer level: Needs assistance Equipment used: Rolling walker (2 wheeled) Transfers: Sit to/from [facility] Sit to Stand: Min assist Stand pivot transfers: Min assist       General transfer comment: assist for safety and balance, pt able to lift up from EOB and from 3:1 after toileting, assist for perineal hygiene  Ambulation/Gait Ambulation/Gait assistance: Min assist Gait Distance (Feet): 42 Feet Assistive device: Rolling walker (2 wheeled) Gait Pattern/deviations: Step-through pattern;Decreased stride length;Step-to pattern     General Gait Details: wearing her sandals from home, demonstrating short steps, though not shuffling, taking 3 steps prior to walk movement, pt able to demonstrate safety with turns to R and L walking to and from  door in room x 2   Stairs             Wheelchair Mobility    Modified Rankin (Stroke Patients Only)       Balance Overall balance assessment: Needs assistance   Sitting balance-Leahy Scale: Good       Standing balance-Leahy Scale: Poor Standing balance comment: reliant on UE support                            Cognition Arousal/Alertness: Awake/alert Behavior During Therapy: WFL for tasks assessed/performed Overall Cognitive Status: Within Functional Limits for tasks assessed                                        Exercises      General Comments General comments (skin integrity, edema, etc.): patient concerned about replacing purewick, but encouraged using BSC while up in recliner and to call for assistance.      Pertinent Vitals/Pain Pain Assessment: Faces Faces Pain Scale: Hurts a little bit Pain Location: generalized with mobility Pain Descriptors / Indicators: Aching Pain Intervention(s): Monitored during session;Repositioned    Home Living                      Prior Function            PT Goals (current goals can now be found in the care plan section) Progress towards PT goals: Progressing toward goals    Frequency  Min 3X/week      PT Plan Current plan remains appropriate    Co-evaluation              AM-PAC PT "6 Clicks" Mobility   Outcome Measure  Help needed turning from your back to your side while in a flat bed without using bedrails?: A Little Help needed moving from lying on your back to sitting on the side of a flat bed without using bedrails?: A Little Help needed moving to and from a bed to a chair (including a wheelchair)?: A Little Help needed standing up from a chair using your arms (e.g., wheelchair or bedside chair)?: A Little Help needed to walk in hospital room?: A Little Help needed climbing 3-5 steps with a railing? : Total 6 Click Score: 16    End of Session    Activity Tolerance: Patient tolerated treatment well Patient left: in chair;with call bell/phone within reach   PT Visit Diagnosis: Muscle weakness (generalized) (M62.81);History of falling (Z91.81);Difficulty in walking, not elsewhere classified (R26.2)     Time: [id number] PT Time Calculation (min) (ACUTE ONLY): 19 min  Charges:  $Gait Training: 8-22 mins                     Magda Kiel, [place] [phone] 09/07/2018    Reginia Naas 09/07/2018, 5:39 PM

## 2018-09-07 NOTE — Progress Notes (Signed)
PROGRESS NOTE    Misty [name]  [id number] DOB: 1960-05-08 DOA: 09/03/2018 PCP: Shelda Pal, DO   Brief Narrative:  Misty [name] is a 58 y.o. female,w hypertension, neuropathy, gerd, who had bilateral breast reduction. Patient presented secondary to dyspnea with concern for pneumonia. COVID-19 testing negative. Patient was started on empiric antibiotics and improving is been changed to p.o. Levaquin. Received 1 unit of PRBC for presumed acute blood loss anemia from recent surgery.  PT and OT is working with patient given her recurrent falls and generalized weakness and they are recommending skilled nursing facility.  Social work consulted for assistance with placement.  Assessment & Plan:   Principal Problem:   Pneumonia Active Problems:   Protein-calorie malnutrition, severe ([place])  Right lower lobe pneumonia and ? Aspiration PNA Acute respiratory failure with hypoxia likely 2/2 to the Pneumonia, which is improved now -COVID-19 negative.  -Procalcitonin of 3.46 on admission, improved to 0.19 and now down to 0.10. -Patient with elevated temperatures but no fevers. TMax in the last 24 Hours was 98.8 -WBC is trended down and went from 11.3 is now 7.3 -Blood cultures pending but no growth to date (x3 days).  -In setting of recent intubation for surgery.  -MRSA PCR negative. -Switched to Levaquin PO she is taking 750 mg p.o. daily -Sputum culture pending -We will need an ambulatory home O2 screen prior to discharge to SNF -PT OT recommending skilled nursing facility given her weakness  Acute on Chronic Anemia/Acute Post-Operative Anemia  -History of iron deficiency anemia.  -Mia panel done and showed an iron level of 8, U IBC 300, TIBC of 308, saturation ratio of 3%, ferritin level 14, folate, slight S3 67.0, folate RBC of 1529, and vitamin B12 level of 1137 -Likely related to recent blood loss from surgery.  -Down to 6.9 on 8/23 and is s/p 1 unit of PRBC. Iron  panel suggests iron deficiency.  -Given one dose of Feraheme yesterday -Patient is hemoglobin/hematocrit is now stable and improved 8.8/29.7 -Continue monitor for signs and symptoms of bleeding; currently no overt bleeding noted -Repeat CBC in a.m.  Recent bilateral Breast Reduction Surgery -Bilateral drains do not appear to be infected. Drain Cultures obtained on admission showed no growth to date at 3 days  Peripheral Neuropathy -Secondary to remote Chemotherapy -Likely causing recurrent falls -We will need to follow with oncology as an outpatient -Continue with Gabapentin 900 mg p.o. 3 times daily  Constipation -C/w Milk of magnesium prn -Plastic Surgery recommending Miralax at D/C   Morbid Obesity -Estimated body mass index is 45.98 kg/m as calculated from the following:   Height as of this encounter: 5\' 4"  (1.626 m).   Weight as of this encounter: 121.5 kg. -Weight Loss and Dietary Counseling Given   Depression and Anxiety -Continue with Aprazolam 0.25-0.5 mg p.o. daily as needed for anxiety -Continue with Citalopram 40 mg p.o. daily   Recurrent Falls -PT OT recommending SNF Social work consulted for assistance with placement and is a process of getting placed to a skilled nursing facility  DVT prophylaxis: SCDs given recent Anemia  Code Status: FULL CODE  Family Communication: No family present at bedside  Disposition Plan: SNF when bed available   Consultants:   Plastic Surgery Dr. Marla Roe on 09/01/2018    Procedures:  Bilateral breast reduction with a right reduction of 1751 grams and left reduction of 195 5 g status post 2 Blake drains by Dr. Marla Roe on 09/01/2018  Antimicrobials:  Anti-infectives (From  admission, onward)   Start     Dose/Rate Route Frequency Ordered Stop   09/06/18 1730  levofloxacin (LEVAQUIN) tablet 750 mg     750 mg Oral Daily 09/06/18 0956     09/04/18 0600  vancomycin (VANCOCIN) 1,750 mg in sodium chloride 0.9 % 500 mL IVPB   Status:  Discontinued     1,750 mg 250 mL/hr over 120 Minutes Intravenous Every 24 hours 09/03/18 0653 09/06/18 0854   09/03/18 1400  ceFEPIme (MAXIPIME) 2 g in sodium chloride 0.9 % 100 mL IVPB  Status:  Discontinued     2 g 200 mL/hr over 30 Minutes Intravenous Every 8 hours 09/03/18 0653 09/06/18 0956   09/03/18 0700  vancomycin (VANCOCIN) 2,500 mg in sodium chloride 0.9 % 500 mL IVPB     2,500 mg 250 mL/hr over 120 Minutes Intravenous  Once 09/03/18 0649 09/03/18 1232   09/03/18 0700  ceFEPIme (MAXIPIME) 2 g in sodium chloride 0.9 % 100 mL IVPB     2 g 200 mL/hr over 30 Minutes Intravenous  Once 09/03/18 0649 09/03/18 0838     Subjective: Seen and examined at bedside and denied chest pain, lightheadedness or dizziness.  States that she is feeling better and states that she is not coughing as much.  No lightheadedness or dizziness.  No other concerns or complaints at this time.  Objective: Vitals:   09/06/18 0754 09/06/18 1111 09/06/18 1729 09/06/18 2305  BP: 116/77  (!) 145/82 116/70  Pulse: 63  63 68  Resp: 14  15 (!) 21  Temp: 98.3 F (36.8 C)  98.8 F (37.1 C) 98.6 F (37 C)  TempSrc: Oral  Oral Oral  SpO2: 98% 96% 90% 90%  Weight:      Height:        Intake/Output Summary (Last 24 hours) at 09/07/2018 0734 Last data filed at 09/07/2018 [id number] Gross per 24 hour  Intake 600 ml  Output 1600 ml  Net -1000 ml   Filed Weights   09/03/18 1800  Weight: 121.5 kg   Examination: Physical Exam:  Constitutional: WN/WD morbidly obese Caucasian female and NAD and appears calm and comfortable Eyes: Lids and conjunctivae normal, sclerae anicteric  ENMT: External Ears, Nose appear normal. Grossly normal hearing. Mucous membranes are moist.  Has some hirsutism Neck: Appears normal, supple, no cervical masses, normal ROM, no appreciable thyromegaly; JVD not appreciated given her body habitus Respiratory: Diminished to auscultation bilaterally, no wheezing, rales, rhonchi or  crackles. Normal respiratory effort and patient is not tachypenic. No accessory muscle use.  She has a breast binder and 2 drains in place from her recent breast surgery Cardiovascular: RRR, no murmurs / rubs / gallops. S1 and S2 auscultated. No extremity edema.  Abdomen: Soft, non-tender, Distended due to body habitus. No masses palpated. No appreciable hepatosplenomegaly. Bowel sounds positive x4.  GU: Deferred. Musculoskeletal: No clubbing / cyanosis of digits/nails. No joint deformity upper and lower extremities.  Skin: Has a breast binder and has a JP drain from either side of each breast Neurologic: CN 2-12 grossly intact with no focal deficits. Romberg sign and cerebellar reflexes not assessed.  Psychiatric: Normal judgment and insight. Alert and oriented x 3. Normal mood and appropriate affect.   Data Reviewed: I have personally reviewed following labs and imaging studies  CBC: Recent Labs  Lab 09/03/18 0027 09/03/18 0835 09/04/18 0726 09/05/18 0945 09/06/18 0636  WBC 11.9* 11.3* 9.2 7.6 7.3  NEUTROABS 9.9*  --   --   --   --  HGB 7.4* 7.4* 6.9* 7.8* 8.6*  HCT 25.4* 25.6*  24.0* 24.0* 26.3* 28.8*  MCV 83.8 82.8 83.9 83.8 83.0  PLT 211 209 205 212 [id number]   Basic Metabolic Panel: Recent Labs  Lab 09/01/18 0907 09/03/18 0027 09/04/18 0726 09/05/18 0945 09/06/18 0636  NA 142 137 140 139 136  K 4.0 3.8 3.8 3.6 4.1  CL 110 103 107 103 99  CO2 25 26 26 28 28   GLUCOSE 81 108* 100* 150* 97  BUN 11 18 8 8 8   CREATININE 0.57 0.83 0.57 0.51 0.47  CALCIUM 8.6* 7.9* 7.8* 7.8* 8.2*   GFR: Estimated Creatinine Clearance: 98.5 mL/min (by C-G formula based on SCr of 0.47 mg/dL). Liver Function Tests: Recent Labs  Lab 09/03/18 0027 09/04/18 0726  AST 16 12*  ALT 13 12  ALKPHOS 40 34*  BILITOT 0.4 0.8  PROT 5.2* 4.7*  ALBUMIN 2.7* 2.1*   No results for input(s): LIPASE, AMYLASE in the last 168 hours. No results for input(s): AMMONIA in the last 168 hours. Coagulation  Profile: No results for input(s): INR, PROTIME in the last 168 hours. Cardiac Enzymes: No results for input(s): CKTOTAL, CKMB, CKMBINDEX, TROPONINI in the last 168 hours. BNP (last 3 results) No results for input(s): PROBNP in the last 8760 hours. HbA1C: No results for input(s): HGBA1C in the last 72 hours. CBG: No results for input(s): GLUCAP in the last 168 hours. Lipid Profile: No results for input(s): CHOL, HDL, LDLCALC, TRIG, CHOLHDL, LDLDIRECT in the last 72 hours. Thyroid Function Tests: No results for input(s): TSH, T4TOTAL, FREET4, T3FREE, THYROIDAB in the last 72 hours. Anemia Panel: No results for input(s): VITAMINB12, FOLATE, FERRITIN, TIBC, IRON, RETICCTPCT in the last 72 hours. Sepsis Labs: Recent Labs  Lab 09/03/18 0103 09/03/18 0835 09/03/18 1708 09/04/18 0726 09/05/18 0450  PROCALCITON  --   --  3.46 0.19 0.10  LATICACIDVEN 1.0 0.9  --   --   --     Recent Results (from the past 240 hour(s))  SARS CORONAVIRUS 2 Nasal Swab Aptima Multi Swab     Status: None   Collection Time: 08/29/18 12:10 PM   Specimen: Aptima Multi Swab; Nasal Swab  Result Value Ref Range Status   SARS Coronavirus 2 NEGATIVE NEGATIVE Final    Comment: (NOTE) SARS-CoV-2 target nucleic acids are NOT DETECTED. The SARS-CoV-2 RNA is generally detectable in upper and lower respiratory specimens during the acute phase of infection. Negative results do not preclude SARS-CoV-2 infection, do not rule out co-infections with other pathogens, and should not be used as the sole basis for treatment or other patient management decisions. Negative results must be combined with clinical observations, patient history, and epidemiological information. The expected result is Negative. Fact Sheet for Patients: SugarRoll.be Fact Sheet for Healthcare Providers: https://www.woods-mathews.com/ This test is not yet approved or cleared by the [place] FDA and  has  been authorized for detection and/or diagnosis of SARS-CoV-2 by FDA under an Emergency Use Authorization (EUA). This EUA will remain  in effect (meaning this test can be used) for the duration of the COVID-19 declaration under Section 56 4(b)(1) of the Act, 21 U.S.C. section 360bbb-3(b)(1), unless the authorization is terminated or revoked sooner. Performed at [facility], [place] [address] [place], [place] [address]   Blood culture (routine x 2)     Status: None (Preliminary result)   Collection Time: 09/03/18  1:55 AM   Specimen: BLOOD RIGHT HAND  Result Value Ref Range Status  Specimen Description BLOOD RIGHT HAND  Final   Special Requests   Final    BOTTLES DRAWN AEROBIC AND ANAEROBIC Blood Culture results may not be optimal due to an inadequate volume of blood received in culture bottles   Culture   Final    NO GROWTH 3 DAYS Performed at [facility], [place] [address], [place], [place] [address]    Report Status PENDING  Incomplete  Blood culture (routine x 2)     Status: None (Preliminary result)   Collection Time: 09/03/18  2:20 AM   Specimen: BLOOD  Result Value Ref Range Status   Specimen Description BLOOD LEFT WRIST  Final   Special Requests   Final    BOTTLES DRAWN AEROBIC AND ANAEROBIC Blood Culture results may not be optimal due to an inadequate volume of blood received in culture bottles   Culture   Final    NO GROWTH 3 DAYS Performed at [facility], [place] [address] [place], [place] [address]    Report Status PENDING  Incomplete  Aerobic/Anaerobic Culture (surgical/deep wound)     Status: None (Preliminary result)   Collection Time: 09/03/18  8:10 AM   Specimen: Wound  Result Value Ref Range Status   Specimen Description WOUND RIGHT DRAIN  Final   Special Requests NONE  Final   Gram Stain   Final    MODERATE WBC PRESENT,BOTH PMN AND MONONUCLEAR NO ORGANISMS SEEN    Culture   Final    NO GROWTH 3 DAYS NO ANAEROBES ISOLATED; CULTURE IN  PROGRESS FOR 5 DAYS Performed at [facility], [place] [address], [place], [place] [address]    Report Status PENDING  Incomplete  Aerobic/Anaerobic Culture (surgical/deep wound)     Status: None (Preliminary result)   Collection Time: 09/03/18  8:20 AM   Specimen: Wound  Result Value Ref Range Status   Specimen Description WOUND LEFT DRAIN  Final   Special Requests NONE  Final   Gram Stain   Final    FEW WBC PRESENT,BOTH PMN AND MONONUCLEAR NO ORGANISMS SEEN    Culture   Final    NO GROWTH 3 DAYS NO ANAEROBES ISOLATED; CULTURE IN PROGRESS FOR 5 DAYS Performed at [facility], [place] [address], [place], [place] [address]    Report Status PENDING  Incomplete  Novel Coronavirus, NAA (hospital order; send-out to ref lab)     Status: None   Collection Time: 09/03/18  8:26 AM  Result Value Ref Range Status   SARS-CoV-2, NAA NOT DETECTED NOT DETECTED Final    Comment: (NOTE) This test was developed and its performance characteristics determined by [employer]. This test has not been FDA cleared or approved. This test has been authorized by FDA under an Emergency Use Authorization (EUA). This test is only authorized for the duration of time the declaration that circumstances exist justifying the authorization of the emergency use of in vitro diagnostic tests for detection of SARS-CoV-2 virus and/or diagnosis of COVID-19 infection under section 564(b)(1) of the Act, 21 U.S.C. [id number]), unless the authorization is terminated or revoked sooner. When diagnostic testing is negative, the possibility of a false negative result should be considered in the context of a patient's recent exposures and the presence of clinical signs and symptoms consistent with COVID-19. An individual without symptoms of COVID-19 and who is not shedding SARS-CoV-2 virus would expect to have a negative (not detected) result in this assay. Performed  At: [facility]  [place], [place] [id number] Rush Farmer MD [id number]    Coronavirus Source NASOPHARYNGEAL  Final    Comment: Performed at [facility], [place] [address], [place], [place] [address]  MRSA PCR Screening     Status: None   Collection Time: 09/05/18 11:20 AM   Specimen: Nasopharyngeal  Result Value Ref Range Status   MRSA by PCR NEGATIVE NEGATIVE Final    Comment:        The GeneXpert MRSA Assay (FDA approved for NASAL specimens only), is one component of a comprehensive MRSA colonization surveillance program. It is not intended to diagnose MRSA infection nor to guide or monitor treatment for MRSA infections. Performed at [facility], [place] [address], [place], [place] [address]     Radiology Studies: No results found.  Scheduled Meds: . citalopram  40 mg Oral Daily  . gabapentin  900 mg Oral TID  . levofloxacin  750 mg Oral Daily   Continuous Infusions:   LOS: 4 days   Kerney Elbe, DO [facility] PAGER is on AMION  If 7PM-7AM, please contact night-coverage [email or web address] Password [facility] 09/07/2018, 7:34 AM

## 2018-09-07 NOTE — Progress Notes (Signed)
[facility]  Subjective: Mrs. Bruck is resting in bed. Appears to be doing well and reports she is feeling better.   She reports they are working on finding a SNF facility for her to be d/c'ed to for rehab and strengthening.  Her incisional/breast pain is adequately controlled and she has no complaints. Incisions are c/d/i.  Drains in place, serosanguinous fluid. Last 24 hours - 75cc per nursing report.  She has had a BM since admission but none since, + flatus. First dose of miralax yesterday per pt. No fevers, chills, n/v. SOB improved.   Objective: Vital signs in last 24 hours: Temp:  [97.4 F (36.3 C)-98.8 F (37.1 C)] 97.4 F (36.3 C) (08/26 0736) Pulse Rate:  [62-68] 62 (08/26 0736) Resp:  [15-21] 21 (08/25 2305) BP: (116-145)/(70-82) 125/71 (08/26 0736) SpO2:  [90 %-96 %] 93 % (08/26 0736) Last BM Date: 09/05/18  Intake/Output from previous day: 08/25 0701 - 08/26 0700 In: 600 [P.O.:600] Out: 1600 [Urine:1525; Drains:75] Intake/Output this shift: No intake/output data recorded.  General appearance: alert, cooperative, no distress and resting in bed, eating breakfast Head: Normocephalic, without obvious abnormality, atraumatic Resp: unlabored, symmetric rise and fall Breasts: incisions c/d/i, no drainage noted. No erythema. Drains in place bilaterally. No rashes noted. Extremities: extremities normal, atraumatic, no cyanosis or edema Pulses: 2+ and symmetric Incision/Wound: Incisions c/d/i. Drains in place with serosanguinous output. Nipple/areola with good color.  Lab Results:  CBC    Component Value Date/Time   WBC 7.3 09/06/2018 0636   RBC 3.47 (L) 09/06/2018 0636   HGB 8.6 (L) 09/06/2018 0636   HGB 11.5 (L) 11/18/2016 0920   HCT 28.8 (L) 09/06/2018 0636   HCT 24.0 (L) 09/03/2018 0835   HCT 36.2 11/18/2016 0920   PLT 235 09/06/2018 0636   PLT 156 11/18/2016 0920   MCV 83.0 09/06/2018 0636   MCV 89.4 11/18/2016 0920   MCH 24.8  (L) 09/06/2018 0636   MCHC 29.9 (L) 09/06/2018 0636   RDW 18.5 (H) 09/06/2018 0636   RDW 21.7 (H) 11/18/2016 0920   LYMPHSABS 0.7 09/03/2018 0027   LYMPHSABS 1.4 11/18/2016 0920   MONOABS 1.0 09/03/2018 0027   MONOABS 1.0 (H) 11/18/2016 0920   EOSABS 0.2 09/03/2018 0027   EOSABS 0.0 11/18/2016 0920   BASOSABS 0.0 09/03/2018 0027   BASOSABS 0.0 11/18/2016 0920    BMET Recent Labs    09/05/18 0945 09/06/18 0636  NA 139 136  K 3.6 4.1  CL 103 99  CO2 28 28  GLUCOSE 150* 97  BUN 8 8  CREATININE 0.51 0.47  CALCIUM 7.8* 8.2*   PT/INR No results for input(s): LABPROT, INR in the last 72 hours. ABG No results for input(s): PHART, HCO3 in the last 72 hours.  Invalid input(s): PCO2, PO2  Studies/Results: No results found.  Anti-infectives: Anti-infectives (From admission, onward)   Start     Dose/Rate Route Frequency Ordered Stop   09/06/18 1730  levofloxacin (LEVAQUIN) tablet 750 mg     750 mg Oral Daily 09/06/18 0956     09/04/18 0600  vancomycin (VANCOCIN) 1,750 mg in sodium chloride 0.9 % 500 mL IVPB  Status:  Discontinued     1,750 mg 250 mL/hr over 120 Minutes Intravenous Every 24 hours 09/03/18 0653 09/06/18 0854   09/03/18 1400  ceFEPIme (MAXIPIME) 2 g in sodium chloride 0.9 % 100 mL IVPB  Status:  Discontinued     2 g 200 mL/hr over 30 Minutes Intravenous Every 8  hours 09/03/18 0653 09/06/18 0956   09/03/18 0700  vancomycin (VANCOCIN) 2,500 mg in sodium chloride 0.9 % 500 mL IVPB     2,500 mg 250 mL/hr over 120 Minutes Intravenous  Once 09/03/18 0649 09/03/18 1232   09/03/18 0700  ceFEPIme (MAXIPIME) 2 g in sodium chloride 0.9 % 100 mL IVPB     2 g 200 mL/hr over 30 Minutes Intravenous  Once 09/03/18 0649 09/03/18 0838      Assessment/Plan: POD#6 from bilateral breast reduction.  Patient doing well from plastic surgery stance. Continue with breast binder until post-op visit, will re-evaluate at that time. Continue with ABD pads over incisions under binder.    Drains in place, will remove at post-op visit after SNF d/c. Patient continuing to use incentive spirometer. Miralax at SNF to assist with BMs  Appreciate medicine management.  Patient to be d/c'ed to SNF facility per medicine and patient.     LOS: 4 days    Charlies Constable, PA-C 09/07/2018

## 2018-09-08 ENCOUNTER — Telehealth: Payer: Self-pay | Admitting: Surgical

## 2018-09-08 LAB — AEROBIC/ANAEROBIC CULTURE W GRAM STAIN (SURGICAL/DEEP WOUND)
Culture: NO GROWTH
Culture: NO GROWTH

## 2018-09-08 LAB — CULTURE, BLOOD (ROUTINE X 2)
Culture: NO GROWTH
Culture: NO GROWTH

## 2018-09-08 MED ORDER — LEVOFLOXACIN 750 MG PO TABS: 750.0000 mg | ORAL_TABLET | Freq: Every day | ORAL | 0 refills | Status: DC

## 2018-09-08 MED ORDER — ACETAMINOPHEN 325 MG PO TABS: 650.0000 mg | ORAL_TABLET | Freq: Four times a day (QID) | ORAL | 0 refills | Status: DC | PRN

## 2018-09-08 MED ORDER — TRAMADOL HCL 50 MG PO TABS: 50.0000 mg | ORAL_TABLET | Freq: Four times a day (QID) | ORAL | 0 refills | Status: DC | PRN

## 2018-09-08 MED ORDER — SENNOSIDES-DOCUSATE SODIUM 8.6-50 MG PO TABS: 1.0000 | ORAL_TABLET | Freq: Two times a day (BID) | ORAL | 0 refills | Status: DC

## 2018-09-08 MED ORDER — ALPRAZOLAM 0.5 MG PO TABS: 0.2500 mg | ORAL_TABLET | Freq: Every day | ORAL | 0 refills | Status: AC | PRN

## 2018-09-08 MED ORDER — POLYETHYLENE GLYCOL 3350 17 G PO PACK
17.0000 g | PACK | Freq: Two times a day (BID) | ORAL | Status: DC
  Administered 2018-09-08: 17 g via ORAL
  Filled 2018-09-08: qty 1

## 2018-09-08 MED ORDER — HYDROCODONE-ACETAMINOPHEN 5-325 MG PO TABS: 1.0000 | ORAL_TABLET | Freq: Four times a day (QID) | ORAL | 0 refills | Status: DC | PRN

## 2018-09-08 MED ORDER — SENNOSIDES-DOCUSATE SODIUM 8.6-50 MG PO TABS
1.0000 | ORAL_TABLET | Freq: Two times a day (BID) | ORAL | Status: DC
  Administered 2018-09-08: 1 via ORAL
  Filled 2018-09-08: qty 1

## 2018-09-08 MED ORDER — POLYETHYLENE GLYCOL 3350 17 G PO PACK: 17.0000 g | PACK | Freq: Two times a day (BID) | ORAL | 0 refills | Status: DC

## 2018-09-08 NOTE — TOC Progression Note (Signed)
Transition of Care [facility]) - Progression Note    Patient Details  Name: Misty [name] MRN: [id number] Date of Birth: 24-Aug-1960  Transition of Care [facility]) CM/SW [place], [place] Phone Number: 09/08/2018, 4:35 PM  Clinical Narrative:   CSW met with patient to confirm choice of [facility]. [facility] has initiated insurance authorization.     Expected Discharge Plan: [place] Barriers to Discharge: Barriers Resolved  Expected Discharge Plan and Services Expected Discharge Plan: [place] Choice: [place] arrangements for the past 2 months: Single Family Home Expected Discharge Date: 09/08/18                                     Social Determinants of Health (SDOH) Interventions    Readmission Risk Interventions No flowsheet data found.

## 2018-09-08 NOTE — Discharge Summary (Signed)
Physician Discharge Summary  Misty [name] [id number] DOB: 1960-04-12 DOA: 09/03/2018  PCP: Shelda Pal, DO  Admit date: 09/03/2018 Discharge date: 09/08/2018  Admitted From: Home Disposition: SNF  Recommendations for Outpatient Follow-up:  1. Follow up with PCP in 1-2 weeks 2. Follow up with Plastic Surgery as an outpatient  3. With Oncology in the outpatient setting 4. Repeat chest x-ray in 3 to 6 weeks 5. Please obtain CMP/CBC, Mag, Phos in one week 6. Please follow up on the following pending results:  Home Health: No Equipment/Devices: 3in1  Discharge Condition: Stable CODE STATUS: FULL CODE  Diet recommendation:   Brief/Interim Summary: Misty B Sweckeris a 58 y.o.female,w hypertension, neuropathy, gerd, who had bilateral breast reduction. Patient presentedsecondary to dyspnea with concern for pneumonia. COVID-19 testing negative. Patient was started on empiric antibiotics and improving is been changed to p.o. Levaquin. Received 1 unit of PRBC for presumed acute blood loss anemia from recent surgery.  PT and OT is working with patient given her recurrent falls and generalized weakness and they are recommending skilled nursing facility.  Social work consulted for assistance with placement and she has a bed today.   Discharge Diagnoses:  Principal Problem:   Pneumonia Active Problems:   Protein-calorie malnutrition, severe ([place])  Right lower lobe pneumonia and ? Aspiration PNA,  Acute respiratory failure with hypoxia likely 2/2 to the Pneumonia, which is improved now -COVID-19 negative.  -Procalcitonin of 3.46 on admission, improved to 0.19 and now down to 0.10. -Patient with elevated temperatures but no fevers. TMax in the last 24 Hours was 98.8 -WBC is trended down and went from 11.3 is now 7.0 -Blood cultures pending but no growth to date (x5 days).  -In setting of recent intubation for surgery. -MRSA PCR negative. -Switched to Levaquin PO she  is taking 750 mg p.o. daily and will complete course -Sputum culturepending -We will need an ambulatory home O2 screen prior to discharge to SNF -PT OT recommending skilled nursing facility given her weakness and will continue   Acute on Chronic Anemia/Acute Post-Operative Anemia  -History of iron deficiency anemia.  -Mia panel done and showed an iron level of 8, U IBC 300, TIBC of 308, saturation ratio of 3%, ferritin level 14, folate, slight S3 67.0, folate RBC of 1529, and vitamin B12 level of 1137 -Likely related to recent blood loss from surgery.  -Down to 6.9 on 8/23 and is s/p 1 unit of PRBC. Iron panel suggests iron deficiency.  -Given one dose of Feraheme the day before yesterday  -Patient is hemoglobin/hematocrit is now stable and improved 8.8/29.7 yesterday and not repeated today  -Continue monitor for signs and symptoms of bleeding; currently no overt bleeding noted -Repeat CBC in a.m.  Recent bilateral Breast Reduction Surgery -Bilateral drains do not appear to be infected. 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 3 times daily  Constipation -C/w Milk of magnesium prn -Plastic Surgery recommending Miralax at D/C  -The patient on MiraLAX 17 g twice daily, senna docusate 1 tab p.o. twice daily and will continue at discharge and may need suppository but will defer to SNF physician  Morbid Obesity -Estimated body mass index is 45.98 kg/m as calculated from the following:   Height as of this encounter: 5\' 4"  (1.626 m).   Weight as of  this encounter: 121.5 kg. -Weight Loss and Dietary Counseling Given   Depression and Anxiety -Continue with Aprazolam 0.25-0.5 mg p.o. daily as needed for anxiety -Continue with Citalopram 40 mg p.o. daily   Recurrent  Falls -PT OT recommending SNF -Social work consulted for assistance with placement and is a process of getting placed to a skilled nursing facility and received authorization today   Discharge Instructions Discharge Instructions    Call MD for:  difficulty breathing, headache or visual disturbances   Complete by: As directed    Call MD for:  extreme fatigue   Complete by: As directed    Call MD for:  hives   Complete by: As directed    Call MD for:  persistant dizziness or light-headedness   Complete by: As directed    Call MD for:  persistant nausea and vomiting   Complete by: As directed    Call MD for:  redness, tenderness, or signs of infection (pain, swelling, redness, odor or green/yellow discharge around incision site)   Complete by: As directed    Call MD for:  severe uncontrolled pain   Complete by: As directed    Call MD for:  temperature >100.4   Complete by: As directed    Diet - low sodium heart healthy   Complete by: As directed    Discharge instructions   Complete by: As directed    You were cared for by a hospitalist during your hospital stay. If you have any questions about your discharge medications or the care you received while you were in the hospital after you are discharged, you can call the unit and ask to speak with the hospitalist on call if the hospitalist that took care of you is not available. Once you are discharged, your primary care physician will handle any further medical issues. Please note that NO REFILLS for any discharge medications will be authorized once you are discharged, as it is imperative that you return to your primary care physician (or establish a relationship with a primary care physician if you do not have one) for your aftercare needs so that they can reassess your need for medications and monitor your lab values.  Follow up with PCP, Plastic Surgery, and Oncology in the outpatient setting. Take all medications as prescribed. If symptoms  change or worsen please return to the ED for evaluation   Increase activity slowly   Complete by: As directed      Allergies as of 09/08/2018   No Known Allergies     Medication List    STOP taking these medications   cephALEXin 500 MG capsule Commonly known as: KEFLEX     TAKE these medications   acetaminophen 325 MG tablet Commonly known as: TYLENOL Take 2 tablets (650 mg total) by mouth every 6 (six) hours as needed for mild pain, fever or headache (fever = >100.4). What changed:   medication strength  how much to take  when to take this  reasons to take this   ALPRAZolam 0.5 MG tablet Commonly known as: XANAX Take 0.5-1 tablets (0.25-0.5 mg total) by mouth daily as needed for anxiety (FOR PANIC/ANXIETY).   citalopram 40 MG tablet Commonly known as: CELEXA Take 40 mg daily by mouth.   gabapentin 300 MG capsule Commonly known as: NEURONTIN TAKE 3 CAPSULES (900 MG TOTAL) BY MOUTH 3 (THREE) TIMES DAILY.   HYDROcodone-acetaminophen 5-325 MG tablet Commonly known as: NORCO/VICODIN Take 1 tablet by mouth every 6 (six) hours as  needed for moderate pain.   levofloxacin 750 MG tablet Commonly known as: LEVAQUIN Take 1 tablet (750 mg total) by mouth daily.   ondansetron 4 MG tablet Commonly known as: Zofran Take 1 tablet (4 mg total) by mouth every 8 (eight) hours as needed for nausea or vomiting.   polyethylene glycol 17 g packet Commonly known as: MIRALAX / GLYCOLAX Take 17 g by mouth 2 (two) times daily.   senna-docusate 8.6-50 MG tablet Commonly known as: Senokot-S Take 1 tablet by mouth 2 (two) times daily.       No Known Allergies  Consultations:  Plastic Surgery  Procedures/Studies: Dg Chest 1 View  Result Date: 09/03/2018 CLINICAL DATA:  Fever EXAM: CHEST  1 VIEW COMPARISON:  June 23, 2016 FINDINGS: The lung volumes are low. The heart size is enlarged. There are bibasilar airspace opacities favored to represent atelectasis. There are probable  bilateral surgical drains. There is no pneumothorax. No acute osseous abnormality. Subcutaneous gas is noted in the patient's chest likely related to the recent surgical intervention. IMPRESSION: 1. Low lung volumes with presumed postoperative atelectasis at the lung bases. 2. No pneumothorax. 3. Cardiomegaly. 4. Bilateral surgical drains, presumably related to the recent surgical intervention. There is subcutaneous gas overlying the patient's chest wall likely related to the recent surgical intervention. Electronically Signed   By: Constance Holster M.D.   On: 09/03/2018 02:45   Ct Angio Chest Pe W And/or Wo Contrast  Result Date: 09/03/2018 CLINICAL DATA:  Patient with recent bilateral breast reductions. Elevated temperature. Evaluate for pulmonary embolus. EXAM: CT ANGIOGRAPHY CHEST WITH CONTRAST TECHNIQUE: Multidetector CT imaging of the chest was performed using the standard protocol during bolus administration of intravenous contrast. Multiplanar CT image reconstructions and MIPs were obtained to evaluate the vascular anatomy. CONTRAST:  166mL OMNIPAQUE IOHEXOL 350 MG/ML SOLN COMPARISON:  Chest CT 07/08/2016 FINDINGS: Cardiovascular: Heart size upper limits of normal. Trace fluid superior pericardial recess. Thoracic aortic vascular calcifications. Adequate opacification of the pulmonary arterial system. Motion artifact limits evaluation particularly of the left lower lobe pulmonary arteries. Within the above limitation, no evidence for acute pulmonary embolus. Mediastinum/Nodes: No enlarged axillary, mediastinal or hilar lymphadenopathy. Moderate-sized hiatal hernia. Lungs/Pleura: Central airways are patent. Patchy ground-glass and consolidative opacities within the right lower lobe with a more triangular base peripheral opacity within the superior segment right lower lobe. No pleural effusion or pneumothorax. Upper Abdomen: No acute process. Musculoskeletal: Thoracic spine degenerative changes. No  aggressive or acute appearing osseous lesions. Postsurgical changes within the breast bilaterally compatible with recent reduction surgery. Review of the MIP images confirms the above findings. IMPRESSION: No evidence for acute pulmonary embolus. Patchy ground-glass and consolidative opacities particularly within the right lower lobe which may represent atelectasis. Infection not excluded. Interval postsurgical changes compatible with bilateral breast reduction surgery. Aortic Atherosclerosis (ICD10-I70.0). Electronically Signed   By: Lovey Newcomer M.D.   On: 09/03/2018 04:17   Mr Lumbar Spine W Wo Contrast  Result Date: 09/03/2018 CLINICAL DATA:  Breast reduction surgery yesterday. Fever. Hypoxia. Back pain. EXAM: MRI LUMBAR SPINE WITHOUT AND WITH CONTRAST TECHNIQUE: Multiplanar and multiecho pulse sequences of the lumbar spine were obtained without and with intravenous contrast. CONTRAST:  10 mL Gadavist COMPARISON:  None. FINDINGS: Segmentation: 5 non rib-bearing lumbar type vertebral bodies are present. The lowest fully formed vertebral body is L5. Alignment: Normal lumbar lordosis is present. There is slight retrolisthesis at L1-2 and L2-3. Minimal anterolisthesis is present at L4-5. Leftward curvature is centered at L4.  Vertebrae: Mild edematous endplate marrow changes are present at L2-3. There is some endplate enhancement. No associated disc edema or enhancement is present. Marrow signal and vertebral body heights are otherwise normal. Conus medullaris and cauda equina: Conus extends to the L1-2 level. Conus and cauda equina appear normal. Paraspinal and other soft tissues: Limited imaging the abdomen is unremarkable. There is no significant adenopathy. No solid organ lesions are present. Disc levels: L1-2: Mild disc bulging and facet hypertrophy is present without significant stenosis. L2-3: A broad-based disc bulge is present. Moderate facet hypertrophy is noted bilaterally. Mild left foraminal narrowing  is present. L3-4: A far left lateral disc protrusion is present. Mild facet hypertrophy is noted. Disc extends into left neural foramen without significant stenosis. L4-5: There is uncovering of a mild broad-based disc protrusion. Moderate facet hypertrophy is present with effusions bilaterally. Disc extends into the foramina bilaterally without significant stenosis. L5-S1: Negative. Other than mild endplate degenerative enhancement at L2-3, there is no pathologic enhancement in the lumbar spine. IMPRESSION: 1. No focal lesion or enhancement to explain the patient's fever. 2. Mild degenerative changes in the lumbar spine as described without significant stenosis apart from minimal left foraminal narrowing at L2-3. Electronically Signed   By: San Morelle M.D.   On: 09/03/2018 11:01   Bilateral breast reduction with a right reduction of 1751 grams and left reduction of 195 5 g status post 2 Blake drains by Dr. Marla Roe on 09/01/2018  Subjective: And examined at bedside states that she is feeling well.  Denies chest pain, lightheadedness or dizziness.  No nausea or vomiting.  States that she had a better night last night.  No other concerns or complaints at this time and she is stable to discharge to SNF and continue her physical therapy.  Discharge Exam: Vitals:   09/07/18 2242 09/08/18 0732  BP: 127/64 117/82  Pulse: 69 60  Resp: 20   Temp: 98.5 F (36.9 C) 98.3 F (36.8 C)  SpO2: 91% 91%   Vitals:   09/07/18 0736 09/07/18 1656 09/07/18 2242 09/08/18 0732  BP: 125/71 (!) 115/55 127/64 117/82  Pulse: 62 66 69 60  Resp:   20   Temp: (!) 97.4 F (36.3 C) 98 F (36.7 C) 98.5 F (36.9 C) 98.3 F (36.8 C)  TempSrc: Oral Oral Oral Oral  SpO2: 93% 92% 91% 91%  Weight:      Height:       General: Pt is alert, awake, not in acute distress Cardiovascular: RRR, S1/S2 +, no rubs, no gallops Respiratory: Diminished bilaterally, no wheezing, no rhonchi; has a breast binder and has a JP  drain from either side of each breast Abdominal: Soft, NT, Distended due to body habitus, bowel sounds + Extremities: no LE edema, no cyanosis  The results of significant diagnostics from this hospitalization (including imaging, microbiology, ancillary and laboratory) are listed below for reference.    Microbiology: Recent Results (from the past 240 hour(s))  Blood culture (routine x 2)     Status: None   Collection Time: 09/03/18  1:55 AM   Specimen: BLOOD RIGHT HAND  Result Value Ref Range Status   Specimen Description BLOOD RIGHT HAND  Final   Special Requests   Final    BOTTLES DRAWN AEROBIC AND ANAEROBIC Blood Culture results may not be optimal due to an inadequate volume of blood received in culture bottles   Culture   Final    NO GROWTH 5 DAYS Performed at [facility],  [address], [place], [place] [address]    Report Status 09/08/2018 FINAL  Final  Blood culture (routine x 2)     Status: None   Collection Time: 09/03/18  2:20 AM   Specimen: BLOOD  Result Value Ref Range Status   Specimen Description BLOOD LEFT WRIST  Final   Special Requests   Final    BOTTLES DRAWN AEROBIC AND ANAEROBIC Blood Culture results may not be optimal due to an inadequate volume of blood received in culture bottles   Culture   Final    NO GROWTH 5 DAYS Performed at [facility], [place] [address], [place], [place] [address]    Report Status 09/08/2018 FINAL  Final  Aerobic/Anaerobic Culture (surgical/deep wound)     Status: None (Preliminary result)   Collection Time: 09/03/18  8:10 AM   Specimen: Wound  Result Value Ref Range Status   Specimen Description WOUND RIGHT DRAIN  Final   Special Requests NONE  Final   Gram Stain   Final    MODERATE WBC PRESENT,BOTH PMN AND MONONUCLEAR NO ORGANISMS SEEN    Culture   Final    NO GROWTH 5 DAYS Performed at [facility], [place] [address], [place], [place] [address]    Report Status PENDING  Incomplete  Aerobic/Anaerobic Culture  (surgical/deep wound)     Status: None (Preliminary result)   Collection Time: 09/03/18  8:20 AM   Specimen: Wound  Result Value Ref Range Status   Specimen Description WOUND LEFT DRAIN  Final   Special Requests NONE  Final   Gram Stain   Final    FEW WBC PRESENT,BOTH PMN AND MONONUCLEAR NO ORGANISMS SEEN    Culture   Final    NO GROWTH 5 DAYS Performed at [facility], [address], [place], [place] [address]    Report Status PENDING  Incomplete  Novel Coronavirus, NAA (hospital order; send-out to ref lab)     Status: None   Collection Time: 09/03/18  8:26 AM  Result Value Ref Range Status   SARS-CoV-2, NAA NOT DETECTED NOT DETECTED Final    Comment: (NOTE) This test was developed and its performance characteristics determined by [employer]. This test has not been FDA cleared or approved. This test has been authorized by FDA under an Emergency Use Authorization (EUA). This test is only authorized for the duration of time the declaration that circumstances exist justifying the authorization of the emergency use of in vitro diagnostic tests for detection of SARS-CoV-2 virus and/or diagnosis of COVID-19 infection under section 564(b)(1) of the Act, 21 U.S.C. KA:123727), unless the authorization is terminated or revoked sooner. When diagnostic testing is negative, the possibility of a false negative result should be considered in the context of a patient's recent exposures and the presence of clinical signs and symptoms consistent with COVID-19. An individual without symptoms of COVID-19 and who is not shedding SARS-CoV-2 virus would expect to have a negative (not detected) result in this assay. Performed  At: [facility] [address] [place], [place] [id number] Rush Farmer MD [id number]    [place]  Final    Comment: Performed at [facility], [place] [address] [place], [place] [address]  MRSA PCR Screening      Status: None   Collection Time: 09/05/18 11:20 AM   Specimen: Nasopharyngeal  Result Value Ref Range Status   MRSA by PCR NEGATIVE NEGATIVE Final    Comment:  The GeneXpert MRSA Assay (FDA approved for NASAL specimens only), is one component of a comprehensive MRSA colonization surveillance program. It is not intended to diagnose MRSA infection nor to guide or monitor treatment for MRSA infections. Performed at [facility], [place] [address], [place], [place] [address]     Labs: BNP (last 3 results) No results for input(s): BNP in the last 8760 hours. Basic Metabolic Panel: Recent Labs  Lab 09/03/18 0027 09/04/18 0726 09/05/18 0945 09/06/18 0636 09/07/18 0800  NA 137 140 139 136 139  K 3.8 3.8 3.6 4.1 4.4  CL 103 107 103 99 99  CO2 26 26 28 28 29   GLUCOSE 108* 100* 150* 97 90  BUN 18 8 8 8 9   CREATININE 0.83 0.57 0.51 0.47 0.48  CALCIUM 7.9* 7.8* 7.8* 8.2* 8.8*  MG  --   --   --   --  1.7  PHOS  --   --   --   --  4.1   Liver Function Tests: Recent Labs  Lab 09/03/18 0027 09/04/18 0726 09/07/18 0800  AST 16 12* 12*  ALT 13 12 16   ALKPHOS 40 34* 46  BILITOT 0.4 0.8 0.3  PROT 5.2* 4.7* 5.4*  ALBUMIN 2.7* 2.1* 2.1*   No results for input(s): LIPASE, AMYLASE in the last 168 hours. No results for input(s): AMMONIA in the last 168 hours. CBC: Recent Labs  Lab 09/03/18 0027 09/03/18 0835 09/04/18 0726 09/05/18 0945 09/06/18 0636 09/07/18 0800  WBC 11.9* 11.3* 9.2 7.6 7.3 7.0  NEUTROABS 9.9*  --   --   --   --  4.3  HGB 7.4* 7.4* 6.9* 7.8* 8.6* 8.8*  HCT 25.4* 25.6*  24.0* 24.0* 26.3* 28.8* 29.7*  MCV 83.8 82.8 83.9 83.8 83.0 83.7  PLT 211 209 205 212 235 254   Cardiac Enzymes: No results for input(s): CKTOTAL, CKMB, CKMBINDEX, TROPONINI in the last 168 hours. BNP: Invalid input(s): POCBNP CBG: No results for input(s): GLUCAP in the last 168 hours. D-Dimer No results for input(s): DDIMER in the last 72 hours. Hgb A1c No results for  input(s): HGBA1C in the last 72 hours. Lipid Profile No results for input(s): CHOL, HDL, LDLCALC, TRIG, CHOLHDL, LDLDIRECT in the last 72 hours. Thyroid function studies No results for input(s): TSH, T4TOTAL, T3FREE, THYROIDAB in the last 72 hours.  Invalid input(s): FREET3 Anemia work up No results for input(s): VITAMINB12, FOLATE, FERRITIN, TIBC, IRON, RETICCTPCT in the last 72 hours. Urinalysis    Component Value Date/Time   COLORURINE YELLOW 09/03/2018 0340   APPEARANCEUR CLOUDY (A) 09/03/2018 0340   LABSPEC 1.027 09/03/2018 0340   PHURINE 5.0 09/03/2018 0340   GLUCOSEU NEGATIVE 09/03/2018 0340   HGBUR NEGATIVE 09/03/2018 0340   BILIRUBINUR NEGATIVE 09/03/2018 0340   KETONESUR NEGATIVE 09/03/2018 0340   PROTEINUR NEGATIVE 09/03/2018 0340   NITRITE NEGATIVE 09/03/2018 0340   LEUKOCYTESUR MODERATE (A) 09/03/2018 0340   Sepsis Labs Invalid input(s): PROCALCITONIN,  WBC,  LACTICIDVEN Microbiology Recent Results (from the past 240 hour(s))  Blood culture (routine x 2)     Status: None   Collection Time: 09/03/18  1:55 AM   Specimen: BLOOD RIGHT HAND  Result Value Ref Range Status   Specimen Description BLOOD RIGHT HAND  Final   Special Requests   Final    BOTTLES DRAWN AEROBIC AND ANAEROBIC Blood Culture results may not be optimal due to an inadequate volume of blood received in culture bottles   Culture   Final  NO GROWTH 5 DAYS Performed at [facility], [place] [address] [place], [place] [address]    Report Status 09/08/2018 FINAL  Final  Blood culture (routine x 2)     Status: None   Collection Time: 09/03/18  2:20 AM   Specimen: BLOOD  Result Value Ref Range Status   Specimen Description BLOOD LEFT WRIST  Final   Special Requests   Final    BOTTLES DRAWN AEROBIC AND ANAEROBIC Blood Culture results may not be optimal due to an inadequate volume of blood received in culture bottles   Culture   Final    NO GROWTH 5 DAYS Performed at [facility],  [place] [address] [place], [place] [address]    Report Status 09/08/2018 FINAL  Final  Aerobic/Anaerobic Culture (surgical/deep wound)     Status: None (Preliminary result)   Collection Time: 09/03/18  8:10 AM   Specimen: Wound  Result Value Ref Range Status   Specimen Description WOUND RIGHT DRAIN  Final   Special Requests NONE  Final   Gram Stain   Final    MODERATE WBC PRESENT,BOTH PMN AND MONONUCLEAR NO ORGANISMS SEEN    Culture   Final    NO GROWTH 5 DAYS Performed at [facility], [place] [address], [place], [place] [address]    Report Status PENDING  Incomplete  Aerobic/Anaerobic Culture (surgical/deep wound)     Status: None (Preliminary result)   Collection Time: 09/03/18  8:20 AM   Specimen: Wound  Result Value Ref Range Status   Specimen Description WOUND LEFT DRAIN  Final   Special Requests NONE  Final   Gram Stain   Final    FEW WBC PRESENT,BOTH PMN AND MONONUCLEAR NO ORGANISMS SEEN    Culture   Final    NO GROWTH 5 DAYS Performed at [facility], [address] [place], [place] [address]    Report Status PENDING  Incomplete  Novel Coronavirus, NAA (hospital order; send-out to ref lab)     Status: None   Collection Time: 09/03/18  8:26 AM  Result Value Ref Range Status   SARS-CoV-2, NAA NOT DETECTED NOT DETECTED Final    Comment: (NOTE) This test was developed and its performance characteristics determined by [employer]. This test has not been FDA cleared or approved. This test has been authorized by FDA under an Emergency Use Authorization (EUA). This test is only authorized for the duration of time the declaration that circumstances exist justifying the authorization of the emergency use of in vitro diagnostic tests for detection of SARS-CoV-2 virus and/or diagnosis of COVID-19 infection under section 564(b)(1) of the Act, 21 U.S.C. KA:123727), unless the authorization is terminated or revoked sooner. When diagnostic testing is negative,  the possibility of a false negative result should be considered in the context of a patient's recent exposures and the presence of clinical signs and symptoms consistent with COVID-19. An individual without symptoms of COVID-19 and who is not shedding SARS-CoV-2 virus would expect to have a negative (not detected) result in this assay. Performed  At: [facility] [address] [place], [place] [id number] Rush Farmer MD [id number]    [place]  Final    Comment: Performed at [facility], [place] [address], [place],  [address]  MRSA PCR Screening     Status: None   Collection Time: 09/05/18 11:20 AM   Specimen: Nasopharyngeal  Result Value Ref Range Status   MRSA by  PCR NEGATIVE NEGATIVE Final    Comment:        The GeneXpert MRSA Assay (FDA approved for NASAL specimens only), is one component of a comprehensive MRSA colonization surveillance program. It is not intended to diagnose MRSA infection nor to guide or monitor treatment for MRSA infections. Performed at [facility], [place] [address] [place], [place] [address]    Time coordinating discharge: 35 minutes  SIGNED:  Kerney Elbe, DO [facility] 09/08/2018, 12:30 PM Pager is on [place]  If 7PM-7AM, please contact night-coverage [email or web address] Password [email or web address]

## 2018-09-08 NOTE — TOC Progression Note (Signed)
Transition of Care [facility]) - Progression Note    Patient Details  Name: Misty [name] MRN: [id number] Date of Birth: [birth year]  Transition of Care [facility]) CM/SW [place], [place] Phone Number: 09/08/2018, 4:31 PM  Clinical Narrative:   CSW met with patient to discuss bed offers. Patient was hopeful to have received more, but still only bed offer is for [facility]. Patient will discuss with her husband tonight and make a decision tomorrow morning.    Expected Discharge Plan: [place] Barriers to Discharge: Barriers Resolved  Expected Discharge Plan and Services Expected Discharge Plan: [place] Choice: [place] arrangements for the past 2 months: Single Family Home Expected Discharge Date: 09/08/18                                     Social Determinants of Health (SDOH) Interventions    Readmission Risk Interventions No flowsheet data found.

## 2018-09-08 NOTE — Progress Notes (Signed)
Occupational Therapy Treatment Patient Details Name: Misty [name] MRN: [id number] DOB: [birth year] Today's Date: 09/08/2018    History of present illness Misty [name] is a 58 y.o. female, w hypertension, neuropathy, gerd, who had bilateral breast reduction now presents with c/o fever to 102, as well as weakness and hypoxia.  Also had recent hernia surgery and has h/o endometrial CA.   OT comments  Pt much improved from last OT session as pt appears much less anxious with mobility throughout session. Pt was able to stand for 9 minutes while standing at sink with min guard for balance. Pt seated in recliner chair at end of session. OT began education on energy conservation and pt was receptive to information. All needs within reach. Pt continues to benefit from OT intervention.   Follow Up Recommendations  SNF;Supervision/Assistance - 24 hour    Equipment Recommendations  (defer to next venue of care)    Recommendations for Other Services      Precautions / Restrictions Precautions Precautions: Fall Precaution Comments: bilateral drains Restrictions Weight Bearing Restrictions: No       Mobility Bed Mobility Overal bed mobility: Needs Assistance Bed Mobility: Supine to Sit     Supine to sit: Supervision;HOB elevated Sit to supine: Mod assist   General bed mobility comments: able to sit up with HOB elevated using rails  Transfers Overall transfer level: Needs assistance Equipment used: Rolling walker (2 wheeled) Transfers: Sit to/from [facility] Sit to Stand: Min assist Stand pivot transfers: Min assist       General transfer comment: assist for safety and balance    Balance Overall balance assessment: Needs assistance   Sitting balance-Leahy Scale: Good     Standing balance support: Bilateral upper extremity supported Standing balance-Leahy Scale: Poor Standing balance comment: reliant on UE support        ADL either performed or  assessed with clinical judgement   ADL Overall ADL's : Needs assistance/impaired     Grooming: Wash/dry hands;Wash/dry face;Oral care;Applying deodorant;Brushing hair;Sitting;Set up Grooming Details (indicate cue type and reason): standing at sink for 9 minutes with min guard for grooming tasks         Vision Baseline Vision/History: Wears glasses Wears Glasses: At all times Patient Visual Report: No change from baseline            Cognition Arousal/Alertness: Awake/alert Behavior During Therapy: WFL for tasks assessed/performed Overall Cognitive Status: Within Functional Limits for tasks assessed        General Comments: much less anxious with OOB tasks this session                   Pertinent Vitals/ Pain       Pain Assessment: Faces Faces Pain Scale: Hurts a little bit Pain Location: generalized with mobility Pain Descriptors / Indicators: Aching Pain Intervention(s): Monitored during session;Repositioned         Frequency  Min 2X/week        Progress Toward Goals  OT Goals(current goals can now be found in the care plan section)  Progress towards OT goals: Progressing toward goals  Acute Rehab OT Goals Patient Stated Goal: to get stronger OT Goal Formulation: With patient Time For Goal Achievement: 09/22/18 Potential to Achieve Goals: Good  Plan Discharge plan remains appropriate       AM-PAC OT "6 Clicks" Daily Activity     Outcome Measure   Help from another person eating meals?: None Help from another person taking care of personal  grooming?: A Little Help from another person toileting, which includes using toliet, bedpan, or urinal?: A Lot Help from another person bathing (including washing, rinsing, drying)?: A Lot Help from another person to put on and taking off regular upper body clothing?: A Little Help from another person to put on and taking off regular lower body clothing?: A Lot 6 Click Score: 16    End of Session Equipment  Utilized During Treatment: Rolling walker  OT Visit Diagnosis: Unsteadiness on feet (R26.81);Repeated falls (R29.6);Muscle weakness (generalized) (M62.81);History of falling (Z91.81)   Activity Tolerance Patient tolerated treatment well   Patient Left with call bell/phone within reach;in chair;with chair alarm set   Nurse Communication Mobility status;Precautions        Time: [id number] OT Time Calculation (min): 29 min  Charges: OT General Charges $OT Visit: 1 Visit OT Treatments $Self Care/Home Management : 23-37 mins   Sharice Harriss, Latanya Presser, MS, OTR/L 09/08/2018, 12:18 PM

## 2018-09-08 NOTE — TOC Transition Note (Signed)
Transition of Care [facility]) - CM/SW Discharge Note   Patient Details  Name: ICES KAMERER MRN: [id number] Date of Birth: [birth year]  Transition of Care [facility]) CM/SW Contact:  Geralynn Ochs, LCSW Phone Number: 09/08/2018, 12:45 PM   Clinical Narrative:   Nurse to call report to [phone], Room 3209.  Transport set for 2:30 PM. Patient to take admission paperwork with her.     Final next level of care: Skilled Nursing Facility Barriers to Discharge: Barriers Resolved   Patient Goals and CMS Choice Patient states their goals for this hospitalization and ongoing recovery are:: to be able to take care of herself again without needing help CMS Medicare.gov Compare Post Acute Care list provided to:: Patient Choice offered to / list presented to : Patient  Discharge Placement              Patient chooses bed at: [facility] Patient to be transferred to facility by: [place] Name of family member notified: Self Patient and family notified of of transfer: 09/08/18  Discharge Plan and Services     Post Acute Care Choice: [place]                               Social Determinants of Health (SDOH) Interventions     Readmission Risk Interventions No flowsheet data found.

## 2018-09-08 NOTE — Telephone Encounter (Signed)
Called Mrs. Onder this afternoon to follow-up on how she was doing.  She reports that every day she has been feeling better and better.  Today she is being discharged and will be going to a skilled nursing facility for some rehab due to her recent weakness. She is currently waiting for medications and will be transferred to SNF.  She has not had any fevers, chills, nausea, vomiting.    She still has her JP drains in place bilaterally and these will remain until she follows up with [place] in the clinic after her discharge from the SNF.

## 2018-09-09 ENCOUNTER — Encounter: Payer: Managed Care, Other (non HMO) | Admitting: Surgical

## 2018-09-09 ENCOUNTER — Telehealth: Payer: Self-pay | Admitting: *Deleted

## 2018-09-09 NOTE — Telephone Encounter (Signed)
Patient discharged from hospital to SNF. Discharge instructions state for her to follow-up with PCP. [place] will reach out to patient after she is released from SNF.

## 2018-09-27 ENCOUNTER — Encounter: Payer: Self-pay | Admitting: Surgical

## 2018-09-27 ENCOUNTER — Other Ambulatory Visit: Payer: Self-pay

## 2018-09-27 ENCOUNTER — Ambulatory Visit (INDEPENDENT_AMBULATORY_CARE_PROVIDER_SITE_OTHER): Payer: Managed Care, Other (non HMO) | Admitting: Surgical

## 2018-09-27 VITALS — BP 138/86 | HR 64 | Temp 97.3°F | Ht 64.0 in | Wt 266.2 lb

## 2018-09-27 DIAGNOSIS — G8929 Other chronic pain: Secondary | ICD-10-CM

## 2018-09-27 DIAGNOSIS — M542 Cervicalgia: Secondary | ICD-10-CM

## 2018-09-27 DIAGNOSIS — M546 Pain in thoracic spine: Secondary | ICD-10-CM

## 2018-09-27 DIAGNOSIS — N62 Hypertrophy of breast: Secondary | ICD-10-CM

## 2018-09-27 NOTE — Progress Notes (Signed)
Subjective:     Patient ID: Misty [name], female    DOB: 04/13/1960, 58 y.o.   MRN: [id number]  Chief Complaint  Patient presents with  . Post-op Follow-up    (B) breast reduction    HPI: The patient is a 58 y.o. female here for follow-up after bilateral breast reduction on 09/01/2018.  Patient's postoperative course was complicated by feeling dizzy and weak at home and passing out postop day #2 without any injuries.  She spent 5 days in the hospital due to weakness, pneumonia.  She then spent 2 weeks in a skilled nursing facility for physical therapy and assistance regaining strength.  Today on exam she reports that she feels a lot better.  She has noticed since discharge from the skilled nursing facility that she has 2 wounds in the bilateral folds of her breast.  She reports that these wounds were not noticed at the skilled nursing facility and she is unsure of when they developed.   She has been eating healthy, drinking plenty of fluids and has been a lot more stable on her feet and feels somewhat back to baseline.  Her neck and back pain have improved.  She reports that she has noticed some swelling in her feet. She does not have any fever, chills, nausea, vomiting, no shortness of breath, no pain with walking in her legs, no unilateral swelling.  Review of Systems  Constitutional: Negative for chills, diaphoresis, fever, malaise/fatigue and weight loss.  Respiratory: Negative for cough, sputum production and shortness of breath.   Cardiovascular: Positive for leg swelling. Negative for chest pain, palpitations and orthopnea.  Gastrointestinal: Negative for abdominal pain, constipation and diarrhea.  Genitourinary: Negative for dysuria, frequency and urgency.  Musculoskeletal: Negative for back pain, myalgias and neck pain.  Skin: Negative for itching and rash.       + wound   Neurological: Positive for sensory change. Negative for dizziness and weakness.   Psychiatric/Behavioral: Negative.      Objective:   Vital Signs BP 138/86 (BP Location: Left Arm, Patient Position: Sitting, Cuff Size: Large)   Pulse 64   Temp (!) 97.3 F (36.3 C) (Temporal)   Ht 5\' 4"  (1.626 m)   Wt 266 lb 3.2 oz (120.7 kg)   LMP 05/31/2016 (Approximate)   SpO2 96%   BMI 45.69 kg/m  Vital Signs and Nursing Note Reviewed  Physical Exam  Constitutional: She is well-developed, well-nourished, and in no distress.  HENT:  Head: Normocephalic and atraumatic.  Cardiovascular: Normal rate.  Pulmonary/Chest: Effort normal. No respiratory distress.    Symmetric rise and fall  Abdominal: Soft. She exhibits no distension.  Musculoskeletal: Normal range of motion.        General: No tenderness, deformity or edema.     Comments: No edema noted on exam today. No calf tenderness.   Skin: Skin is warm and dry. No rash noted. She is not diaphoretic. No erythema. No pallor.  + wound, bilateral breast folds   Psychiatric: Mood and affect normal.    Assessment/Plan:     ICD-10-CM   1. Chronic bilateral thoracic back pain  M54.6    G89.29   2. Symptomatic mammary hypertrophy  N62   3. Neck pain  M54.2     Patient is improving. Optimize diet for high protein, a lot of vegetables. Multivitamin. Avoid carbs and sugars.  NAC incisions healing nicely.  Vaseline applied to the inframammary wounds today. Avoid allowing too much moisture build-up. Continue to apply  xeroform daily and prevent excessive moisture by changing gauze based on level soil. Patient denies fever, chills, n/v. Feeling well otherwise  Follow up in 1 week for evaluation of wounds.   Carola Rhine Anthonia Monger, PA-C 09/27/2018, 2:34 PM

## 2018-10-03 ENCOUNTER — Telehealth: Payer: Self-pay

## 2018-10-03 NOTE — Telephone Encounter (Signed)

## 2018-10-03 NOTE — Progress Notes (Signed)
   Subjective:     Patient ID: Misty [name], female    DOB: 28-Mar-1960, 58 y.o.   MRN: [id number]  Chief Complaint  Patient presents with  . Follow-up    1 week  . Breast Problem    HPI: The patient is a 58 y.o. female here for follow-up after bilateral breast reduction. She is 1 month post-op.   At her last visit, it was noted she had bilateral inframammary fold wounds that developed post-operatively.   She has been putting dressings on the area but they have fibers.  We'll change the dressing to eliminate the fibers.  Review of Systems  Constitutional: Negative.   HENT: Negative.   Eyes: Negative.   Cardiovascular: Negative.   Gastrointestinal: Negative.   Genitourinary: Negative.   Skin: Negative.   Neurological: Negative.   Psychiatric/Behavioral: Negative.      Objective:   Vital Signs BP (!) 149/82 (BP Location: Left Arm, Patient Position: Sitting, Cuff Size: Large)   Pulse (!) 57   Temp (!) 97.5 F (36.4 C) (Temporal)   Ht 5\' 4"  (1.626 m)   Wt 168 lb (76.2 kg)   LMP 05/31/2016 (Approximate)   SpO2 99%   BMI 28.84 kg/m  Vital Signs and Nursing Note Reviewed  Physical Exam  Constitutional: She is well-developed, well-nourished, and in no distress.  HENT:  Head: Normocephalic.  Pulmonary/Chest: Effort normal.    Abdominal: Soft.  Neurological: She is alert.  Skin: Skin is warm.  Psychiatric: Affect and judgment normal.      Assessment/Plan:     ICD-10-CM   1. Symptomatic mammary hypertrophy  N62    Will send orders for home supplies for collagen and gauze.  Change daily.  Follow up in 1-2 weeks.    [place], DO 10/04/2018, 12:47 PM

## 2018-10-04 ENCOUNTER — Ambulatory Visit (INDEPENDENT_AMBULATORY_CARE_PROVIDER_SITE_OTHER): Payer: Managed Care, Other (non HMO) | Admitting: Plastic Surgery

## 2018-10-04 ENCOUNTER — Other Ambulatory Visit: Payer: Self-pay

## 2018-10-04 ENCOUNTER — Encounter: Payer: Self-pay | Admitting: Plastic Surgery

## 2018-10-04 VITALS — BP 149/82 | HR 57 | Temp 97.5°F | Ht 64.0 in | Wt 168.0 lb

## 2018-10-04 DIAGNOSIS — N62 Hypertrophy of breast: Secondary | ICD-10-CM

## 2018-10-05 ENCOUNTER — Telehealth: Payer: Self-pay | Admitting: *Deleted

## 2018-10-05 NOTE — Telephone Encounter (Signed)
Faxed order form along with a copy of the patient's Demographics and Insurance care to [employer] for the patient to received medical supplies at home.  Confirmation received.//AB/CMA

## 2018-10-17 ENCOUNTER — Telehealth: Payer: Self-pay | Admitting: Neurology

## 2018-10-17 ENCOUNTER — Telehealth: Payer: Self-pay | Admitting: Family Medicine

## 2018-10-17 NOTE — Telephone Encounter (Signed)
Left message for patient to call back with referral information and see what is needed

## 2018-10-17 NOTE — Telephone Encounter (Signed)
Left message with after hour service on 10-17-18 @ 12:43 pm    Patient needs a referral

## 2018-10-17 NOTE — Telephone Encounter (Signed)
Copied from [place] [id number]. 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 Pt has [employer]   scheduled

## 2018-10-17 NOTE — Telephone Encounter (Signed)
No answer at 139

## 2018-10-18 ENCOUNTER — Other Ambulatory Visit: Payer: Self-pay

## 2018-10-18 ENCOUNTER — Ambulatory Visit (INDEPENDENT_AMBULATORY_CARE_PROVIDER_SITE_OTHER): Payer: Managed Care, Other (non HMO) | Admitting: Family Medicine

## 2018-10-18 ENCOUNTER — Encounter: Payer: Self-pay | Admitting: Family Medicine

## 2018-10-18 DIAGNOSIS — L680 Hirsutism: Secondary | ICD-10-CM

## 2018-10-18 NOTE — Progress Notes (Signed)
Chief Complaint  Patient presents with  . Referral    endo    Subjective: Patient is a 58 y.o. female here for discussion of balding. Due to COVID-19 pandemic, we are interacting via web portal for an electronic face-to-face visit. I verified patient's ID using 2 identifiers. Patient agreed to proceed with visit via this method. Patient is at home, I am at office. Patient and I are present for visit.   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  Prior to her cancer diagnosis, she had a cosmetic procedure involving a transplant that did seem to help things with her hair.  When she went on chemotherapy, her hair got worse again.  She does have a family history of female pattern baldness in her brothers and father.  She has 5 sisters and no issues are experienced by any of them.  She does not use any special products in her hair and does not pull her hair particularly tight.  She also notices hair growth on her face.  She did have a hysterectomy and they removed her ovaries as well.  No history of polycystic ovarian syndrome.  She is aware this may be related to genetics, but is requesting a referral to endocrinology for work-up regarding this.  ROS: Skin: As noted in HPI  Past Medical History:  Diagnosis Date  . Ambulates with cane   . Anemia    prior to hysterectomy  . Ankle fracture   . Anxiety   . Aortic atherosclerosis ([place])   . Cardiomegaly    Mild  . Endometrial cancer ([place])   . Family history of brain cancer   . Family history of breast cancer   . Family history of colon cancer   . Family history of colonic polyps   . Family history of prostate cancer   . History of chemotherapy   . History of gallstones   . History of hiatal hernia   . History of radiation therapy 09/07/16-10/07/16   HDR to vaginal vault 30 gy in 5 fractions  . Hypertension    No BP medications, related more to anxiety  . Inguinal hernia    Right  . Neuropathy    Chemo induce, feet  and fingers  . Paraumbilical hernia    Small    Objective: No conversational dyspnea Age appropriate judgment and insight Nml affect and mood Some female pattern baldness noted in addition to facial hair  Assessment and Plan: Hirsutism - Plan: Ambulatory referral to Endocrinology  If workup by endo neg, will discuss further testing here vs referral to derm.  The patient voiced understanding and agreement to the plan.  [place], DO 10/18/18  12:23 PM

## 2018-10-18 NOTE — Telephone Encounter (Signed)
Sched E visit today please.

## 2018-10-21 ENCOUNTER — Ambulatory Visit (INDEPENDENT_AMBULATORY_CARE_PROVIDER_SITE_OTHER): Payer: Managed Care, Other (non HMO) | Admitting: Surgical

## 2018-10-21 ENCOUNTER — Encounter: Payer: Self-pay | Admitting: Surgical

## 2018-10-21 ENCOUNTER — Other Ambulatory Visit: Payer: Self-pay

## 2018-10-21 VITALS — BP 146/91 | HR 60 | Temp 97.5°F | Ht 64.0 in | Wt 269.8 lb

## 2018-10-21 DIAGNOSIS — G8929 Other chronic pain: Secondary | ICD-10-CM

## 2018-10-21 DIAGNOSIS — N62 Hypertrophy of breast: Secondary | ICD-10-CM

## 2018-10-21 DIAGNOSIS — M542 Cervicalgia: Secondary | ICD-10-CM

## 2018-10-21 DIAGNOSIS — M546 Pain in thoracic spine: Secondary | ICD-10-CM

## 2018-10-21 NOTE — Progress Notes (Signed)
   Subjective:     Patient ID: Misty [name], female    DOB: Nov 26, 1960, 58 y.o.   MRN: [id number]  Chief Complaint  Patient presents with  . Follow-up    2 weeks for (B) breast reduction    HPI: The patient is a 58 y.o. female here for follow-up  after bilateral breast reduction on 09/01/2018.  She developed bilateral inframammary fold wounds.  They have been doing collagen dressing changes every 48 hours.  She reports that she is doing well overall.  Back to her baseline.  Denies any fever, chills, nausea, vomiting.  Drainage from the wound is mild.  Nonpurulent, no foul smell, no sign of infection.  The largest right breast inframammary fold wound is approximately 4 x 2 cm x 0.5 cm.  Mild exudate.  There is a small wound medial to this larger wound that is approximately 1 x 1 cm.  Her left breast wound is improving.  New epithelial tissue at the borders with mild to moderate exudate noted.  Her vertical limb and NAC incisions are healing well.  C/D/I.  No dehiscence noted.  Review of Systems  Constitutional: Negative for chills, diaphoresis, fever, malaise/fatigue and weight loss.  Respiratory: Negative.   Cardiovascular: Negative.   Gastrointestinal: Negative for abdominal pain, diarrhea, nausea and vomiting.  Genitourinary: Negative.   Musculoskeletal: Negative.  Negative for back pain, myalgias and neck pain.  Skin: Negative for itching and rash.  Neurological: Positive for sensory change. Negative for dizziness, focal weakness, weakness and headaches.     Objective:   Vital Signs BP (!) 146/91 (BP Location: Left Arm, Patient Position: Sitting, Cuff Size: Large)   Pulse 60   Temp (!) 97.5 F (36.4 C) (Temporal)   Ht 5\' 4"  (1.626 m)   Wt 269 lb 12.8 oz (122.4 kg)   LMP 05/31/2016 (Approximate)   SpO2 98%   BMI 46.31 kg/m  Vital Signs and Nursing Note Reviewed Chaperone present Physical Exam  Constitutional: She is oriented to person, place, and time and  well-developed, well-nourished, and in no distress.  HENT:  Head: Normocephalic and atraumatic.  Cardiovascular: Normal rate.  Pulmonary/Chest: Effort normal.    Musculoskeletal: Normal range of motion.  Neurological: She is alert and oriented to person, place, and time. Gait normal.  Skin: Skin is warm and dry. No rash noted. She is not diaphoretic. No erythema. No pallor.  Psychiatric: Mood and affect normal.      Assessment/Plan:     ICD-10-CM   1. Chronic bilateral thoracic back pain  M54.6    G89.29   2. Symptomatic mammary hypertrophy  N62   3. Neck pain  M54.2     Donated ACell applied to right breast wound.  Daily [place] dressing changes.  She can shower in 3 days.  Continue with collagen dressing changes on all wounds except wound on the right breast were donated ACell was applied.  Begin with collagen dressing changes in 1 week on this area.  Call with any questions or concerns.  Her left breast wound is healing and improving, the right breast has slightly increased in size since last photo, but does not appear infected.  Pictures were obtained of the patient and placed in the chart with the patient's or guardian's permission.  Follow-up in 2 weeks   Carola Rhine Micco Bourbeau, PA-C 10/21/2018, 9:42 AM

## 2018-10-30 ENCOUNTER — Other Ambulatory Visit: Payer: Self-pay | Admitting: Hematology and Oncology

## 2018-10-31 ENCOUNTER — Telehealth: Payer: Self-pay | Admitting: *Deleted

## 2018-10-31 NOTE — Telephone Encounter (Signed)
CALLED PATIENT TO ASK ABOUT MAKING FU APPT. FOR HER WITH DR. KINARD, LVM FOR A RETURN CALL

## 2018-11-01 ENCOUNTER — Telehealth: Payer: Self-pay | Admitting: Oncology

## 2018-11-01 ENCOUNTER — Other Ambulatory Visit: Payer: Self-pay | Admitting: Neurology

## 2018-11-01 NOTE — Telephone Encounter (Signed)
Left a message for [facility] regarding a refill request for gabapentin that Dr. Alvy Bimler received.

## 2018-11-04 ENCOUNTER — Ambulatory Visit (INDEPENDENT_AMBULATORY_CARE_PROVIDER_SITE_OTHER): Payer: Managed Care, Other (non HMO) | Admitting: Surgical

## 2018-11-04 ENCOUNTER — Other Ambulatory Visit: Payer: Self-pay

## 2018-11-04 ENCOUNTER — Encounter: Payer: Self-pay | Admitting: Surgical

## 2018-11-04 VITALS — BP 147/95 | HR 60 | Temp 97.1°F | Ht 64.0 in | Wt 267.6 lb

## 2018-11-04 DIAGNOSIS — N62 Hypertrophy of breast: Secondary | ICD-10-CM

## 2018-11-04 DIAGNOSIS — G8929 Other chronic pain: Secondary | ICD-10-CM

## 2018-11-04 DIAGNOSIS — M542 Cervicalgia: Secondary | ICD-10-CM

## 2018-11-04 DIAGNOSIS — M546 Pain in thoracic spine: Secondary | ICD-10-CM

## 2018-11-04 NOTE — Progress Notes (Signed)
   Subjective:     Patient ID: Misty [name], female    DOB: Mar 28, 1960, 58 y.o.   MRN: [id number]  Chief Complaint  Patient presents with  . Follow-up    2 weeks    HPI: The patient is a 58 y.o. female here for follow-up after bilateral breast reduction on 09/01/2018. She is 2 months post op.  Misty [name] is doing well. They have been doing every other day collagen dressing changes with KY jelly, collagen, gauze, tape. She reports that it has been going well and her husband has been helping her.  The right breast wounds have decreased in size and she has multiple small wounds now with good granulation tissue and minimal exudate. Largest wound is just lateral to vertical limb incision and ~ 3 x 2 x 0.2 cm.  Left breast wound significantly decreased and there is now an area that is 0.25 x 0.25 x 0.25 cm pinhole at junction or vertical limb and inframammary fold incision.  No sign of infection. No erythema noted in peri-wound area. Slightly irritation of left breast laterally due to tape.  Denies any fever, chills, n/v. No complaints. She is pleased with her progress and reports her neck and back feel a lot better.    Review of Systems  Constitutional: Positive for activity change. Negative for appetite change, chills, diaphoresis, fatigue and fever.  Respiratory: Negative for shortness of breath and wheezing.   Cardiovascular: Negative for chest pain and leg swelling.  Gastrointestinal: Negative for nausea and vomiting.  Skin: Positive for wound. Negative for color change, pallor and rash.  Neurological: Negative for dizziness and weakness.     Objective:   Vital Signs BP (!) 147/95 (BP Location: Left Arm, Patient Position: Sitting, Cuff Size: Large)   Pulse 60   Temp (!) 97.1 F (36.2 C) (Temporal)   Ht 5\' 4"  (1.626 m)   Wt 267 lb 9.6 oz (121.4 kg)   LMP 05/31/2016 (Approximate)   SpO2 94%   BMI 45.93 kg/m  Vital Signs and Nursing Note Reviewed  Physical Exam   Constitutional: She is oriented to person, place, and time and well-developed, well-nourished, and in no distress.  HENT:  Head: Normocephalic and atraumatic.  Cardiovascular: Normal rate.  Pulmonary/Chest: Effort normal.    Musculoskeletal: Normal range of motion.  Neurological: She is alert and oriented to person, place, and time. Gait normal.  Skin: Skin is warm and dry. Rash (irritation L breast) noted. She is not diaphoretic. No erythema. No pallor.  Psychiatric: Mood and affect normal.      Assessment/Plan:     ICD-10-CM   1. Chronic bilateral thoracic back pain  M54.6    G89.29   2. Symptomatic mammary hypertrophy  N62   3. Neck pain  M54.2    Misty [name]'s wounds have significantly improved. Donated Acell previously applied to R breast wound incorporated nicely. Good granulation tissue noted. No sign of infection, seroma, hematoma.  Her neck and back pain significantly improved.  Continue with collagen, ky, gauze, tape every other day.  Follow up in 3 weeks. She can try using the elliptical at home, but to avoid any jarring motions and to take it slow until she has complete wound healing.    Carola Rhine Ramiro Pangilinan, PA-C 11/04/2018, 8:08 AM

## 2018-11-11 ENCOUNTER — Ambulatory Visit: Payer: Managed Care, Other (non HMO) | Admitting: Neurology

## 2018-11-14 ENCOUNTER — Ambulatory Visit (INDEPENDENT_AMBULATORY_CARE_PROVIDER_SITE_OTHER): Payer: Managed Care, Other (non HMO) | Admitting: Neurology

## 2018-11-14 ENCOUNTER — Encounter: Payer: Self-pay | Admitting: Neurology

## 2018-11-14 ENCOUNTER — Other Ambulatory Visit: Payer: Self-pay

## 2018-11-14 VITALS — BP 150/90 | HR 78 | Ht 76.0 in | Wt 274.0 lb

## 2018-11-14 DIAGNOSIS — T451X5A Adverse effect of antineoplastic and immunosuppressive drugs, initial encounter: Secondary | ICD-10-CM

## 2018-11-14 DIAGNOSIS — G620 Drug-induced polyneuropathy: Secondary | ICD-10-CM | POA: Diagnosis not present

## 2018-11-14 NOTE — Progress Notes (Signed)
Follow-up Visit   Date: 11/14/18    MUNIRAH MUNNELL MRN: [id number] DOB: December 24, 1960   Interim History: Misty [name] is a 58 y.o. Caucasian female with endometrial cancer s/p robotic assisted total hysterectomy and BSO, radiation and chemotheray, anxiety and panic attacks handed returning to the clinic for follow-up of chemotherapy-induced neuropathy.  The patient was accompanied to the clinic by self.    History of present illness: She was diagnosed with endometrial cancer in June 2018 and started on chemotherapy with carboplatin and taxol in July - November 2018.  Prior to her 5th chemotherapy cycle, she started having numbness and tingling of the fingers and feet which progressed after her 5th session, so her taxol was discontinued.  Since this time, she has constant cold-burning sensation of the feet to the level of the ankles.  She also has intermittent shooting and stabbing pain.  She takes gabapentin 900mg  three times daily, which signficantly improves her shooting pain.   Prior to diagnosis of endometrial cancer, she was drinking at least two glasses of wine per day for many years.  No history of diabetes, thyroid disease, or family history of neuropathy.  UPDATE 11/12/2017:  She has many questions regarding gabapentin regarding indications of use.  She continues to take gabapentin 900mg  three times daily which helps neuropathy pain.  Sometimes she has breakthrough pain and it is worse. She does not want to increase the dose.    UPDATE 11/14/2018:  She is here for 1 year follow-up.  Overall, her neuropathy is stable and controlled on gabapentin 900mg  TID.  She has questions about prognosis and alternative therapies.  She recent had bilateral breast reduction and is healing from this. She did suffer one fall at home, without injuries. She walks unssisted and uses a rollator for long distances. She denies weakness and is trying to get more active, but finds that after walking for  40-46min she has to take a break because her legs get numb.   Medications:  Current Outpatient Medications on File Prior to Visit  Medication Sig Dispense Refill   acetaminophen (TYLENOL) 325 MG tablet Take 2 tablets (650 mg total) by mouth every 6 (six) hours as needed for mild pain, fever or headache (fever = >100.4). 30 tablet 0   ALPRAZolam (XANAX) 0.5 MG tablet Take 0.5-1 tablets (0.25-0.5 mg total) by mouth daily as needed for anxiety (FOR PANIC/ANXIETY). 5 tablet 0   citalopram (CELEXA) 40 MG tablet Take 40 mg daily by mouth.  1   gabapentin (NEURONTIN) 300 MG capsule TAKE 3 CAPSULES (900 MG TOTAL) BY MOUTH 3 (THREE) TIMES DAILY. 810 capsule 1   ondansetron (ZOFRAN) 4 MG tablet Take 1 tablet (4 mg total) by mouth every 8 (eight) hours as needed for nausea or vomiting. 20 tablet 0   polyethylene glycol (MIRALAX / GLYCOLAX) 17 g packet Take 17 g by mouth 2 (two) times daily. 14 each 0   No current facility-administered medications on file prior to visit.     Allergies: No Known Allergies  Review of Systems:  CONSTITUTIONAL: No fevers, chills, night sweats, or weight loss.  EYES: No visual changes or eye pain ENT: No hearing changes.  No history of nose bleeds.   RESPIRATORY: No cough, wheezing and shortness of breath.   CARDIOVASCULAR: Negative for chest pain, and palpitations.   GI: Negative for abdominal discomfort, blood in stools or black stools.  No recent change in bowel habits.   GU:  No history of  incontinence.   MUSCLOSKELETAL: No history of joint pain or swelling.  No myalgias.   SKIN: No for lesions, rash, and itching.   ENDOCRINE: Negative for cold or heat intolerance, polydipsia or goiter.   PSYCH:  No depression or anxiety symptoms.   NEURO: As Above.   Vital Signs:  BP (!) 150/90    Pulse 78    Ht 6\' 4"  (1.93 m)    Wt 274 lb (124.3 kg)    LMP 05/31/2016 (Approximate)    SpO2 98%    BMI 33.35 kg/m   Neurological Exam: MENTAL STATUS including orientation  to time, place, person, recent and remote memory, attention span and concentration, language, and fund of knowledge is normal.  Speech is not dysarthric.  CRANIAL NERVES:   Pupils equal round and reactive to light.  Normal conjugate, extra-ocular eye movements in all directions of gaze.  No ptosis.   MOTOR:  Motor strength is 5/5 in all extremities. No pronator drift.  Tone is normal.    MSRs:  Reflexes are brisk 2+/4 throughout, except absent Achilles.  SENSORY:  Vibration is absent at the great toe bilaterally, intact above the ankles (stable).  COORDINATION/GAIT:   Gait slow, mildly wide-based and stable unassisted.  She uses a walker for long distances.   Data: n/a  IMPRESSION/PLAN: Chemotherapy-induced neuropathy.  Clinically stable Continue gabapentin 900mg  three times daily.  No recent dose change has been made and going forward, she may request future refills from her PCP since she has been stable, otherwise will need to follow-up with me once per year for refills. Patient educated on daily foot inspection, fall prevention, and safety precautions around the home. She had many questions on alternative therapies, which she can explore at her own discretion   Greater than 50% of this 20 minute visit was spent in counseling, explanation of diagnosis, planning of further management, and coordination of care.   Thank you for allowing me to participate in patient's care.  If I can answer any additional questions, I would be pleased to do so.    Sincerely,    Glenn Gullickson K. Posey Pronto, DO

## 2018-11-25 ENCOUNTER — Ambulatory Visit (INDEPENDENT_AMBULATORY_CARE_PROVIDER_SITE_OTHER): Payer: Managed Care, Other (non HMO) | Admitting: Surgical

## 2018-11-25 ENCOUNTER — Encounter: Payer: Self-pay | Admitting: Surgical

## 2018-11-25 ENCOUNTER — Other Ambulatory Visit: Payer: Self-pay

## 2018-11-25 VITALS — BP 134/71 | HR 57 | Temp 97.9°F | Ht 64.0 in | Wt 272.0 lb

## 2018-11-25 DIAGNOSIS — G8929 Other chronic pain: Secondary | ICD-10-CM

## 2018-11-25 DIAGNOSIS — M546 Pain in thoracic spine: Secondary | ICD-10-CM

## 2018-11-25 DIAGNOSIS — M542 Cervicalgia: Secondary | ICD-10-CM

## 2018-11-25 DIAGNOSIS — N62 Hypertrophy of breast: Secondary | ICD-10-CM

## 2018-11-25 NOTE — Progress Notes (Signed)
Subjective:     Patient ID: Misty [name], female    DOB: [birth year], 59 y.o.   MRN: [id number]  Chief Complaint  Patient presents with  . Follow-up    2 weeks    HPI: The patient is a 58 y.o. female here for follow-up after bilateral breast reduction. She is 1 week short of 3 months post-op.  She is doing really well. They have been applying collagen to the right breast wounds. She has 3 small wounds at this time, one at the vertical limb junction inferiorly and this is scabbing and epithelialized. Lateral to this is a wound that is ~ 1.5 cm x 0.75 cm and has good granulation tissue without any exudate. The most lateral wound is 0.25 x 0.25 cm and healing well. No sign of infection, no drainage, collagen in place.  The left breast wound is well healed and has a small area of scabbing that is healing well. She has an area on her left breast that appears to be a dried blister, possibly from tape. There is a similar blister on the right breast without scabbing at this time.   Denies fever, chills, n/v. She is very happy with her progress and has been receiving excellent help from her husband.  Review of Systems  Constitutional: Positive for activity change. Negative for appetite change, chills, diaphoresis, fatigue and fever.  Respiratory: Negative for cough, shortness of breath and wheezing.   Cardiovascular: Negative for chest pain, palpitations and leg swelling.  Gastrointestinal: Negative for diarrhea, nausea and vomiting.  Genitourinary: Negative.   Musculoskeletal: Negative.  Negative for back pain and neck pain.  Skin: Positive for wound. Negative for color change, pallor and rash.  Neurological: Negative for dizziness, weakness and headaches.     Objective:   Vital Signs BP 134/71 (BP Location: Left Arm, Patient Position: Sitting, Cuff Size: Large)   Pulse (!) 57   Temp 97.9 F (36.6 C) (Oral)   Ht 5\' 4"  (1.626 m)   Wt 272 lb (123.4 kg)   LMP 05/31/2016  (Approximate)   SpO2 96%   BMI 46.69 kg/m  Vital Signs and Nursing Note Reviewed  Physical Exam  Constitutional: She is oriented to person, place, and time and well-developed, well-nourished, and in no distress.  HENT:  Head: Normocephalic and atraumatic.  Cardiovascular: Normal rate.  Pulmonary/Chest: Effort normal.    L breast wounds healing well, small scabbing noted at inferior vertical limb. New dried blister noted on L lateral breast, no erythema, no drainage.  Right breast inframammary fold wounds healing well. There is a small wound laterally 0.25 x 0.25 cm. More medially, there is a wound ~ 1.5cm x 0.75cm. Good granulation tissue noted. Collagen in place.    Musculoskeletal: Normal range of motion.  Neurological: She is alert and oriented to person, place, and time. Gait normal.  Skin: Skin is warm and dry. No rash noted. She is not diaphoretic. No erythema. No pallor.  Psychiatric: Mood and affect normal.    Assessment/Plan:     ICD-10-CM   1. Chronic bilateral thoracic back pain  M54.6    G89.29   2. Symptomatic mammary hypertrophy  N62   3. Neck pain  M54.2    Misty [name] is doing really well.  Vaseline applied to all wounds today. Tomorrow continue with collagen to right breast wound ~ 1.25 x 0.75 cm. No sign of infection, seroma, hematoma. No purulence noted. No drainage noted.  In regards to left breast blister,  continue with Vaseline daily. Monitor for changes. Suspect blister from tape. No surrounding erythema, swelling noted.  Massage are on left breast vertical limb for relief of scarring and fat necrosis.   F/u in 3 weeks. Call with questions or concerns.  Carola Rhine Joshia Kitchings, PA-C 11/25/2018, 8:33 AM

## 2018-12-02 ENCOUNTER — Other Ambulatory Visit: Payer: Self-pay

## 2018-12-06 ENCOUNTER — Encounter: Payer: Self-pay | Admitting: Internal Medicine

## 2018-12-06 ENCOUNTER — Ambulatory Visit (INDEPENDENT_AMBULATORY_CARE_PROVIDER_SITE_OTHER): Payer: Managed Care, Other (non HMO) | Admitting: Internal Medicine

## 2018-12-06 VITALS — BP 128/96 | HR 59 | Temp 97.7°F | Ht 64.0 in | Wt 269.0 lb

## 2018-12-06 DIAGNOSIS — L680 Hirsutism: Secondary | ICD-10-CM | POA: Diagnosis not present

## 2018-12-06 LAB — TSH: TSH: 1.17 u[IU]/mL (ref 0.35–4.50)

## 2018-12-06 NOTE — Patient Instructions (Signed)
-   PLease stop by the lab today, will contact you with the results and treatment options.

## 2018-12-06 NOTE — Progress Notes (Signed)
Name: Misty [name]  MRN: [id number]   Age/ Sex: 58 y.o., female    PCP: Misty Pal, DO   Reason for Endocrinology Evaluation: Hirsuitism  Date of Initial Endocrinology Evaluation: 12/06/2018     HPI: Patient is a 58 year old  female with a PMHx of obesity,and endometrial cancer.  who presents for initial outpatient endocrine consultation visit on 12/06/2018 for her hirsutsim.     Had endometrial cancer in 2018, S/P surgery and chemo . She had chemo induced neuropathy and hair loss.   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 She denies  signs of virilization, acne, increased muscle mass, or deepening of the voice. She denies galactorrhea, hyperpigmentation / purple discoloration of stretch marks. Admits to  history of unintentional weight gain.  She commenced menstruation at age 30 years old. Since the time of onset, menstrual cycles have been regular in nature, except in the perimenopausal years. She is not on  danazol, anabolic steroid, or phenytoin use.  There is no known family history of hirsutism, severe acne, menstrual irregularity, infertility, early cardiovascular disease, or obesity.  Sister with breast Ca.   PAST HISTORY:  Past Medical History: Hx of endometrial cancer, Obesity, and chemo induced chemotherapy   Past Surgical History:  has a past surgical history that includes Cholecystectomy; Tonsillectomy; Robotic assisted total hysterectomy with bilateral salpingo oophorectomy (Bilateral, 06/25/2016); Lymph node biopsy (N/A, 06/25/2016); IR US Guide Vasc Access Right (07/31/2016); IR FLUORO GUIDE PORT INSERTION  RIGHT (07/31/2016); IR REMOVAL TUN ACCESS W/ PORT W/O FL MOD SED (01/13/2017); Ankle fracture surgery (Right); Inguinal hernia repair (Right, 08/20/2017); Insertion of mesh (Right, 08/20/2017); laparoscopy (N/A, 08/20/2017); Umbilical hernia repair (N/A, 08/20/2017); and Breast reduction surgery (Bilateral, 09/01/2018).   Social History:  reports that she has never smoked. She has never used smokeless tobacco. She reports previous alcohol use of about 1.0 standard drinks of alcohol per week. She reports that she does not use drugs.  Family History: Sister with Breast Ca    REVIEW OF SYSTEMS: A 10-point comprehensive ROS was conducted with the patient and is negative except as per HPI and below:  Review of Systems  Constitutional: Negative for fever and weight loss.  HENT: Negative for congestion and sore throat.   Respiratory: Negative for cough and shortness of breath.   Cardiovascular: Negative for chest pain and palpitations.  Gastrointestinal: Negative for diarrhea and nausea.  Genitourinary: Negative for frequency.  Neurological: Positive for tingling and sensory change.  Endo/Heme/Allergies: Negative for polydipsia.      OBJECTIVE: VS: BP (!) 128/96 (BP Location: Right Arm, Patient Position: Sitting, Cuff Size: Large)   Pulse (!) 59   Temp 97.7 F (36.5 C)   Ht 5\' 4"  (1.626 m)   Wt 269 lb (122 kg)   LMP 05/31/2016 (Approximate)   SpO2 98%   BMI 46.17 kg/m    EXAM: General: Pt appears well and is in NAD, ambulates with a walker  Eyes: External eye exam normal without stare, lid lag or exophthalmos.  EOM intact.   Neck: General: Supple without adenopathy. Thyroid: Thyroid size  normal.  No goiter or nodules appreciated. No thyroid bruit.  Lungs: Clear with good BS bilat with no rales, rhonchi, or wheezes  Heart: Auscultation: RRR with normal S1 and S2, no gallops or murmurs  Abdomen: Normoactive bowel sounds, soft, nontender, without masses or organomegaly palpable  Lymphatics:    Extremities: BL LE: no pretibial edema   Skin: Hair: Thinning of the hair at the scalp, excessive, thick dark hair over the upper lip and chin area, rest of body hair is normal.  Skin Inspection: no rashes, acanthosis nigricans/skin tags. Skin Palpation: skin temperature, texture, and thickness normal to palpation  Neuro: Cranial nerves: II - XII grossly intact ;  Motor: normal strength throughout DTRs: 2+ and symmetric in UE without delay in relaxation phase  Mental Status: Judgment, insight: intact Orientation: oriented to time, place, and person Mood and affect: no depression, anxiety, or agitation     HISTORICAL DATA REVIEWED: Results for Misty [name] (MRN [id number]) as of 12/06/2018 10:45  Ref. Range 09/07/2018 08:00  Sodium Latest Ref Range: 135 - 145 mmol/L 139  Potassium Latest Ref Range: 3.5 - 5.1 mmol/L 4.4  Chloride Latest Ref Range: 98 - 111 mmol/L 99  CO2 Latest Ref Range: 22 - 32 mmol/L 29  Glucose Latest Ref Range: 70 - 99 mg/dL 90  BUN Latest Ref Range: 6 - 20 mg/dL 9  Creatinine Latest Ref Range: 0.44 - 1.00 mg/dL 0.48  Calcium Latest Ref Range: 8.9 - 10.3 mg/dL 8.8 (L)  Anion gap Latest Ref Range: 5 - 15  11  Phosphorus Latest Ref Range: 2.5 - 4.6 mg/dL 4.1  Magnesium Latest Ref Range: 1.7 - 2.4 mg/dL 1.7  Alkaline Phosphatase Latest Ref Range: 38 - 126 U/L 46  Albumin Latest Ref Range: 3.5 - 5.0 g/dL 2.1 (L)  AST Latest Ref Range: 15 - 41 U/L 12 (L)  ALT Latest Ref Range: 0 - 44 U/L 16  Total Protein Latest Ref Range: 6.5 - 8.1 g/dL 5.4 (L)  Total Bilirubin Latest Ref Range: 0.3 - 1.2 mg/dL 0.3  GFR, Est Non African American Latest Ref Range: >60 mL/min >60  GFR, Est African American Latest Ref Range: >60 mL/min >60  WBC Latest Ref Range: 4.0 - 10.5 K/uL 7.0  RBC Latest Ref Range: 3.87 - 5.11 MIL/uL 3.55 (L)  Hemoglobin Latest Ref Range: 12.0 - 15.0 g/dL 8.8 (L)  HCT Latest Ref Range: 36.0 - 46.0 % 29.7 (L)  MCV Latest Ref Range: 80.0 - 100.0 fL 83.7   MCH Latest Ref Range: 26.0 - 34.0 pg 24.8 (L)  MCHC Latest Ref Range: 30.0 - 36.0 g/dL 29.6 (L)  RDW Latest Ref Range: 11.5 - 15.5 % 19.1 (H)  Platelets Latest Ref Range: 150 - 400 K/uL 254  nRBC Latest Ref Range: 0.0 - 0.2 % 0.3 (H)  Neutrophils Latest Units: % 61  Lymphocytes Latest Units: % 16  Monocytes Relative Latest Units: % 11  Eosinophil Latest Units: % 6  Basophil Latest Units: % 1  Immature Granulocytes Latest Units: % 5  NEUT# Latest Ref Range: 1.7 - 7.7 K/uL 4.3  Lymphocyte # Latest Ref Range: 0.7 - 4.0 K/uL 1.1  Monocyte # Latest Ref Range: 0.1 - 1.0 K/uL 0.8  Eosinophils Absolute Latest Ref Range: 0.0 - 0.5 K/uL 0.4  Basophils Absolute Latest Ref Range: 0.0 - 0.1 K/uL 0.1  Abs Immature Granulocytes Latest Ref Range: 0.00 - 0.07 K/uL 0.33 (H)     ASSESSMENT: 1. Hirsutism   - Pt  has a long history of excessive hair growth over the face. Will proceed with testosterone and DHEAS checks . We discussed the first line of treatment for hirsutism for post-menopausal women is Spironolactone - We did discuss that its a diuretic, we discussed side effects of dehydration, decrease renal function and hyperkalemia.    F/U - pending lab results    Signed electronically by: Mack Guise, MD  [facility]  [facility] [address] [place], [place] [address] Phone: [phone] FAX: [phone]    CC: Misty [name], [place] [address] [place] [address] Fax: [phone]

## 2018-12-07 ENCOUNTER — Encounter: Payer: Self-pay | Admitting: Internal Medicine

## 2018-12-15 LAB — 17-HYDROXYPROGESTERONE: 17-OH-Progesterone, LC/MS/MS: 22 ng/dL

## 2018-12-15 LAB — DHEA-SULFATE: DHEA-SO4: 122 ug/dL (ref 8–188)

## 2018-12-15 LAB — TESTOSTERONE, TOTAL, LC/MS/MS: Testosterone, Total, LC-MS-MS: 31 ng/dL (ref 2–45)

## 2018-12-16 ENCOUNTER — Telehealth: Payer: Self-pay | Admitting: Internal Medicine

## 2018-12-16 NOTE — Telephone Encounter (Signed)
Discussed normal DHEAS, testosterone and TSH.     Results for YARALIZ, KERRICK (MRN [id number]) as of 12/16/2018 16:04  Ref. Range 12/06/2018 10:43  DHEA-SO4 Latest Ref Range: 8 - 188 mcg/dL 122  Testosterone, Total, LC-MS-MS Latest Ref Range: 2 - 45 ng/dL 31  17-OH-Progesterone, LC/MS/MS Latest Ref Range: ng/dL 22  TSH Latest Ref Range: 0.35 - 4.50 uIU/mL 1.17    Pt with idiopathic hirsutism, we discussed treatment with spironolactone but after discussing the benefits and side effects, we have agreed that since her area of excessive facial growth is limited to the face , the risks outweigh the benefit.    Pt has opted to pursue electrolysis at this time.     Abby Nena Jordan, MD  [facility] Endocrin[facility]  [facility] [address] [place], [place] [address] Phone: [phone] FAX: [phone]

## 2018-12-22 ENCOUNTER — Other Ambulatory Visit: Payer: Self-pay

## 2018-12-22 ENCOUNTER — Encounter: Payer: Self-pay | Admitting: Surgical

## 2018-12-22 ENCOUNTER — Ambulatory Visit (INDEPENDENT_AMBULATORY_CARE_PROVIDER_SITE_OTHER): Payer: Managed Care, Other (non HMO) | Admitting: Surgical

## 2018-12-22 VITALS — BP 147/88 | HR 58 | Temp 96.9°F

## 2018-12-22 DIAGNOSIS — M546 Pain in thoracic spine: Secondary | ICD-10-CM

## 2018-12-22 DIAGNOSIS — N62 Hypertrophy of breast: Secondary | ICD-10-CM

## 2018-12-22 DIAGNOSIS — G8929 Other chronic pain: Secondary | ICD-10-CM

## 2018-12-22 DIAGNOSIS — Z9889 Other specified postprocedural states: Secondary | ICD-10-CM

## 2018-12-22 DIAGNOSIS — M542 Cervicalgia: Secondary | ICD-10-CM

## 2018-12-22 NOTE — Progress Notes (Signed)
   Subjective:     Patient ID: Misty [name], female    DOB: 10-11-1960, 58 y.o.   MRN: [id number]  Chief Complaint  Patient presents with  . Follow-up    HPI: The patient is a 58 y.o. female here for follow-up after bilateral breast reduction. Post-operatively she developed wounds mostly along the IMF of bilateral breasts. She has been healing well overall.  Today she is doing really well.  She is very happy with her progress.  There is no open wounds noted along the transverse incision of the inframammary fold or vertical limb.  Her incisions have healed exceptionally well and she is very pleased as am I.  There is no erythema, swelling.  No sign of seroma/hematoma.  No sign of infection.  No fevers, chills, nausea, vomiting.  Review of Systems  Constitutional: Positive for activity change. Negative for chills, diaphoresis and fever.  Respiratory: Negative for chest tightness and shortness of breath.   Cardiovascular: Negative for palpitations and leg swelling.  Musculoskeletal: Negative for back pain and neck pain.  Skin: Negative for color change, pallor, rash and wound.     Objective:   Vital Signs BP (!) 147/88 (BP Location: Left Arm, Patient Position: Sitting, Cuff Size: Large)   Pulse (!) 58   Temp (!) 96.9 F (36.1 C) (Temporal)   LMP 05/31/2016 (Approximate)   SpO2 98%  Vital Signs and Nursing Note Reviewed Chaperone present Physical Exam  Constitutional: She is oriented to person, place, and time and well-developed, well-nourished, and in no distress.  HENT:  Head: Normocephalic and atraumatic.  Cardiovascular: Normal rate.  Pulmonary/Chest: Effort normal.  Breast incisions are well healed.  No open wounds at this time.  No erythema, swelling.  Musculoskeletal:        General: Normal range of motion.  Neurological: She is alert and oriented to person, place, and time. Gait normal.  Skin: Skin is warm and dry. No rash noted. She is not diaphoretic. No  erythema. No pallor.  Psychiatric: Mood and affect normal.    Assessment/Plan:     ICD-10-CM   1. Chronic bilateral thoracic back pain  M54.6    G89.29   2. Symptomatic mammary hypertrophy  N62   3. Neck pain  M54.2   4. S/P breast reconstruction  [phone]     Mrs. Molinari is doing really well.  There is no open wounds at this time.  All of her incisions are healed well.  She is very happy with her progress.  She can begin using her elliptical machine, increased density as tolerated.  Call with any questions or concerns.  Schedule follow-up in 6 months.  If she does not want to come in at that time and everything is doing well we can do a telemetry visit.  Or postpone to 1 year.  Carola Rhine Joao Mccurdy, PA-C 12/22/2018, 9:05 AM

## 2019-03-06 ENCOUNTER — Other Ambulatory Visit: Payer: Self-pay | Admitting: Neurology

## 2019-03-10 ENCOUNTER — Other Ambulatory Visit: Payer: Self-pay

## 2019-03-10 ENCOUNTER — Other Ambulatory Visit: Payer: Self-pay | Admitting: Neurology

## 2019-03-10 ENCOUNTER — Telehealth: Payer: Self-pay | Admitting: Neurology

## 2019-03-10 ENCOUNTER — Telehealth: Payer: Self-pay | Admitting: Family Medicine

## 2019-03-10 MED ORDER — GABAPENTIN 300 MG PO CAPS: 900.0000 mg | ORAL_CAPSULE | Freq: Three times a day (TID) | ORAL | 5 refills | Status: DC

## 2019-03-10 MED ORDER — GABAPENTIN 300 MG PO CAPS: 900.0000 mg | ORAL_CAPSULE | Freq: Three times a day (TID) | ORAL | 1 refills | Status: DC

## 2019-03-10 NOTE — Telephone Encounter (Signed)
Patient lmom regarding needing to get a refill on her Gabapentin. She said her refill request was denied. She said her Rx is expired and she is almost out. Should she have a follow up appointment? Please Advise. Thank you

## 2019-03-10 NOTE — Telephone Encounter (Signed)
Last seen in Nov 2020. OK to give 6 month refill.

## 2019-03-10 NOTE — Telephone Encounter (Signed)
Per previous note PCP to fill Gabapentin, please advise

## 2019-03-10 NOTE — Telephone Encounter (Signed)
LM to discuss upcoming appointment with Dr. Larose Kells. It appears she received refill from another provider, request patient reschedule appt on 03/13/2019 to another date with her PCP (Dr. Nani Ravens).

## 2019-03-10 NOTE — Telephone Encounter (Signed)
Patient is requesting a prescription refill of  gabapentin (NEURONTIN) 300 MG capsule [id number].   Patient is  No longer seeing Patel,Donika DO. Patient is seeing Nani Ravens and has appointment set with Dr Larose Kells On Monday due to running out of medication soon.   Please Advise if patient can get a few pill sent to carry patient through weekend.

## 2019-03-13 ENCOUNTER — Encounter: Payer: Self-pay | Admitting: Internal Medicine

## 2019-03-13 ENCOUNTER — Other Ambulatory Visit: Payer: Self-pay

## 2019-03-13 ENCOUNTER — Ambulatory Visit (INDEPENDENT_AMBULATORY_CARE_PROVIDER_SITE_OTHER): Payer: Managed Care, Other (non HMO) | Admitting: Internal Medicine

## 2019-03-13 VITALS — Ht 64.0 in

## 2019-03-13 DIAGNOSIS — T451X5A Adverse effect of antineoplastic and immunosuppressive drugs, initial encounter: Secondary | ICD-10-CM

## 2019-03-13 DIAGNOSIS — G620 Drug-induced polyneuropathy: Secondary | ICD-10-CM

## 2019-03-13 NOTE — Progress Notes (Signed)
Subjective:    Patient ID: Misty [name], female    DOB: [birth year], 59 y.o.   MRN: [id number]  DOS:  03/13/2019 Type of visit - description: Virtual Visit via Video Note  I connected with the above patient  by a video enabled telemedicine application and verified that I am speaking with the correct person using two identifiers.   THIS ENCOUNTER IS A VIRTUAL VISIT DUE TO COVID-19 - PATIENT WAS NOT SEEN IN THE OFFICE. PATIENT HAS CONSENTED TO VIRTUAL VISIT / TELEMEDICINE VISIT   Location of patient: home  Location of provider: office  I discussed the limitations of evaluation and management by telemedicine and the availability of in person appointments. The patient expressed understanding and agreed to proceed.  Acute The patient has a long history of neuropathy, typically seen by Dr. Posey Pronto, she is stable she is like to be sure that gabapentin was refilled.  No other issues.   Review of Systems See above   Past Medical History:  Diagnosis Date  . Ambulates with cane   . Anemia    prior to hysterectomy  . Ankle fracture   . Anxiety   . Aortic atherosclerosis ([place])   . Cardiomegaly    Mild  . Endometrial cancer ([place])   . Family history of brain cancer   . Family history of breast cancer   . Family history of colon cancer   . Family history of colonic polyps   . Family history of prostate cancer   . History of chemotherapy   . History of gallstones   . History of hiatal hernia   . History of radiation therapy 09/07/16-10/07/16   HDR to vaginal vault 30 gy in 5 fractions  . Hypertension    No BP medications, related more to anxiety  . Inguinal hernia    Right  . Neuropathy    Chemo induce, feet and fingers  . Paraumbilical hernia    Small    Past Surgical History:  Procedure Laterality Date  . ANKLE FRACTURE SURGERY Right   . BREAST REDUCTION SURGERY Bilateral 09/01/2018   Procedure: MAMMARY REDUCTION  (BREAST);  Surgeon: Wallace Going, DO;  Location: [facility];  Service: Plastics;  Laterality: Bilateral;  4 hours, please  . CHOLECYSTECTOMY    . INGUINAL HERNIA REPAIR Right 08/20/2017   Procedure: OPEN RIGHT INGUINAL HERNIA REPAIR ERA PATHWAY;  Surgeon: Alphonsa Overall, MD;  Location: [facility];  Service: General;  Laterality: Right;  . INSERTION OF MESH Right 08/20/2017   Procedure: INSERTION OF MESH;  Surgeon: Alphonsa Overall, MD;  Location: [facility];  Service: General;  Laterality: Right;  . IR FLUORO GUIDE PORT INSERTION RIGHT  07/31/2016  . IR REMOVAL TUN ACCESS W/ PORT W/O FL MOD SED  01/13/2017  . IR US GUIDE VASC ACCESS RIGHT  07/31/2016  . LAPAROSCOPY N/A 08/20/2017   Procedure: ABDOMINAL LAPAROSCOPY;  Surgeon: Alphonsa Overall, MD;  Location: [facility];  Service: General;  Laterality: N/A;  . LYMPH NODE BIOPSY N/A 06/25/2016   Procedure: LYMPH NODE BIOPSY;  Surgeon: Everitt Amber, MD;  Location: [facility];  Service: Gynecology;  Laterality: N/A;  . ROBOTIC ASSISTED TOTAL HYSTERECTOMY WITH BILATERAL SALPINGO OOPHERECTOMY Bilateral 06/25/2016   Procedure: XI ROBOTIC ASSISTED TOTAL HYSTERECTOMY WITH BILATERAL SALPINGO OOPHORECTOMY for uterus greater 250 grams ;  Surgeon: Everitt Amber, MD;  Location: [facility];  Service: Gynecology;  Laterality: Bilateral;  . TONSILLECTOMY    . UMBILICAL HERNIA REPAIR N/A 08/20/2017   Procedure: HERNIA  REPAIR UMBILICAL ADULT;  Surgeon: Alphonsa Overall, MD;  Location: [facility];  Service: General;  Laterality: N/A;    Allergies as of 03/13/2019   No Known Allergies     Medication List       Accurate as of March 13, 2019  3:42 PM. If you have any questions, ask your nurse or doctor.        acetaminophen 325 MG tablet Commonly known as: TYLENOL Take 2 tablets (650 mg total) by mouth every 6 (six) hours as needed for mild pain, fever or headache (fever = >100.4).   ALPRAZolam 0.5 MG tablet Commonly known as: XANAX Take 0.5-1 tablets (0.25-0.5 mg total) by mouth daily as needed for anxiety (FOR PANIC/ANXIETY).   citalopram 40 MG  tablet Commonly known as: CELEXA Take 40 mg daily by mouth.   gabapentin 300 MG capsule Commonly known as: NEURONTIN Take 3 capsules (900 mg total) by mouth 3 (three) times daily.   ondansetron 4 MG tablet Commonly known as: Zofran Take 1 tablet (4 mg total) by mouth every 8 (eight) hours as needed for nausea or vomiting.   polyethylene glycol 17 g packet Commonly known as: MIRALAX / GLYCOLAX Take 17 g by mouth 2 (two) times daily.             Objective:   Physical Exam Ht 5\' 4"  (1.626 m)   LMP 05/31/2016 (Approximate)   BMI 46.17 kg/m  This is a virtual video visit, she is alert oriented x3, in no apparent distress.    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 It turns out that neurology already refill her medication last week. Other than that she is doing well. Encouraged to schedule a physical with PCP. Call this office for future Neurontin RFs.     I discussed the assessment and treatment plan with the patient. The patient was provided an opportunity to ask questions and all were answered. The patient agreed with the plan and demonstrated an understanding of the instructions.   The patient was advised to call back or seek an in-person evaluation if the symptoms worsen or if the condition fails to improve as anticipated.

## 2019-03-13 NOTE — Progress Notes (Signed)
Pre visit review using our clinic review tool, if applicable. No additional management support is needed unless otherwise documented below in the visit note. 

## 2019-05-19 ENCOUNTER — Telehealth: Payer: Self-pay | Admitting: *Deleted

## 2019-05-19 NOTE — Telephone Encounter (Signed)
RETURNED PATIENT'S PHONE CALL AND RESCHEDULED FU FOR 05-29-19 TO 07/13/19 PER PATIENT REQUEST, SPOKE WITH PATIENT AND SHE VERIFIED UNDERSTANDING THIS

## 2019-05-26 IMAGING — CR DG CHEST 2V
2 series · 2 of 2 positions shown · non-contrast
Comparison: 09/12/2013

CLINICAL DATA: Pre op hysterectomy, endometrial cancer Never a
smoker - pt states no other chest hx

EXAM:
CHEST  2 VIEW

[w chest pa *]
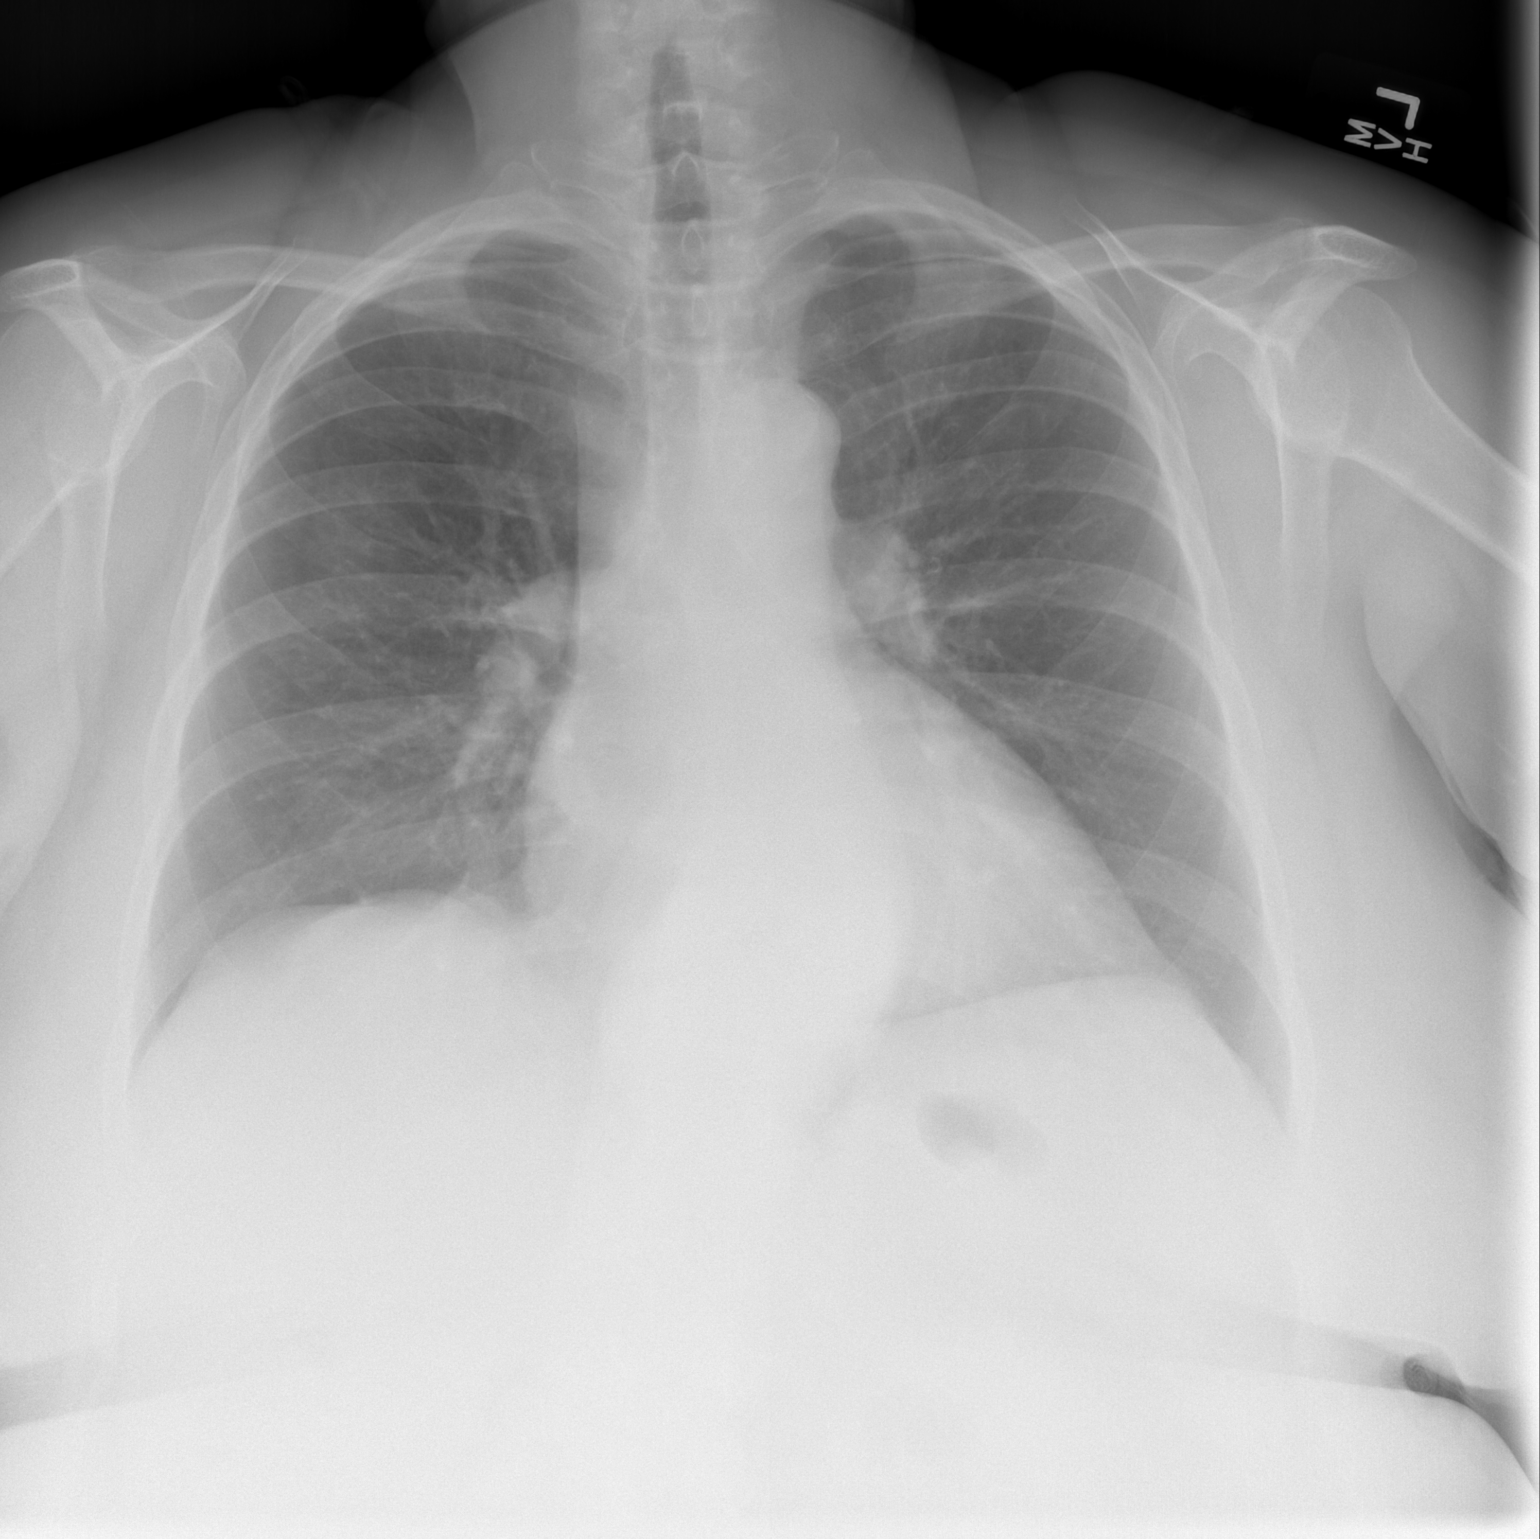

[w chest lat *]
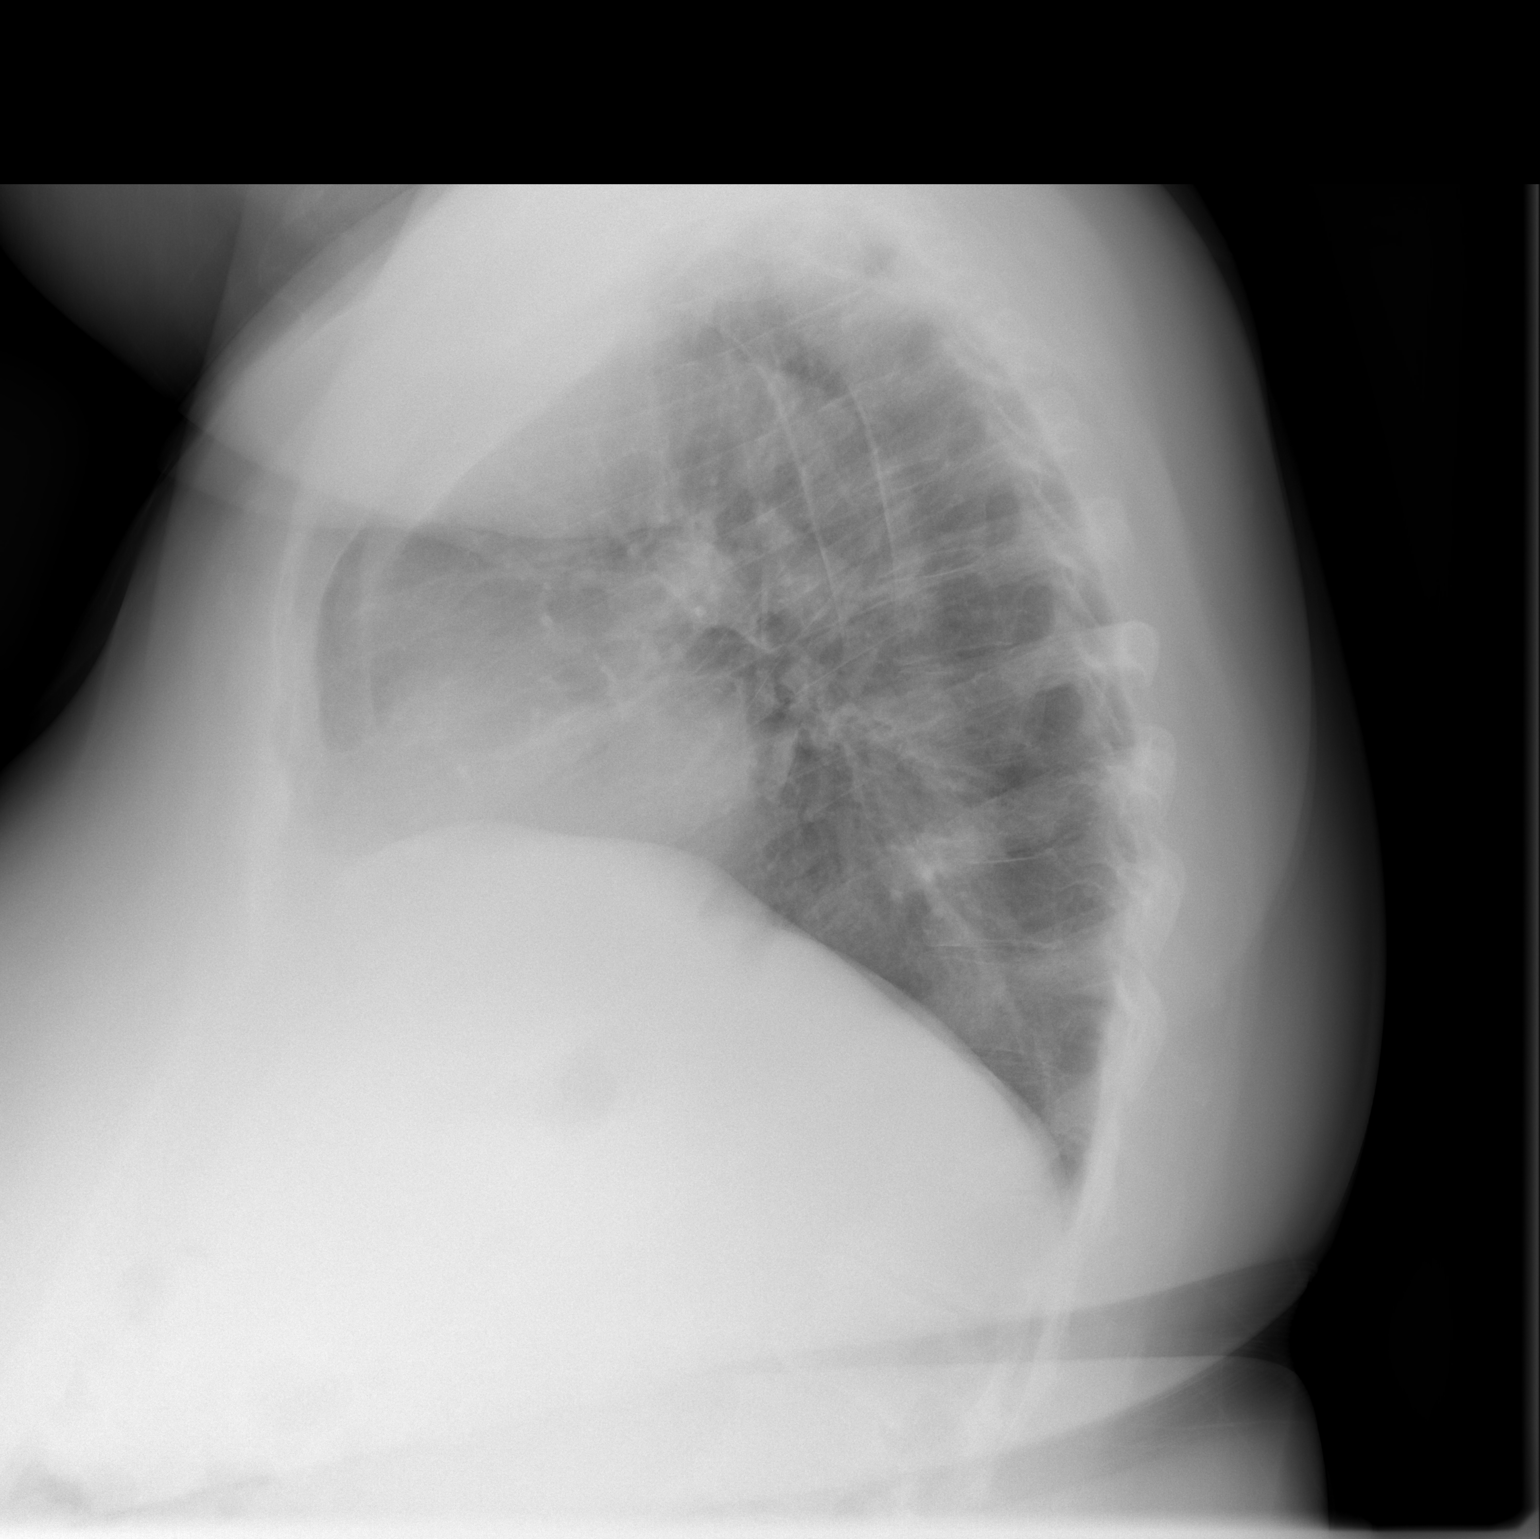

[2 of 2 positions shown; findings below may reference images not displayed]

FINDINGS: Lateral view degraded by patient arm position. Midline trachea. Mild
cardiomegaly with a tortuous descending thoracic aorta. No pleural
effusion or pneumothorax. Clear lungs.
IMPRESSION: Mild cardiomegaly, without acute disease or evidence of metastatic
disease.

## 2019-05-29 ENCOUNTER — Ambulatory Visit: Payer: Managed Care, Other (non HMO) | Admitting: Radiation Oncology

## 2019-06-23 ENCOUNTER — Ambulatory Visit (INDEPENDENT_AMBULATORY_CARE_PROVIDER_SITE_OTHER): Payer: Managed Care, Other (non HMO) | Admitting: Plastic Surgery

## 2019-06-23 ENCOUNTER — Encounter: Payer: Self-pay | Admitting: Plastic Surgery

## 2019-06-23 ENCOUNTER — Other Ambulatory Visit: Payer: Self-pay

## 2019-06-23 VITALS — BP 106/76 | HR 72 | Temp 98.4°F

## 2019-06-23 DIAGNOSIS — N62 Hypertrophy of breast: Secondary | ICD-10-CM | POA: Diagnosis not present

## 2019-06-23 NOTE — Progress Notes (Signed)
° °  Subjective:    Patient ID: Misty [name], female    DOB: 1960-09-23, 59 y.o.   MRN: [id number]  Patient is a 59 year old female here with her husband for follow-up on her breast reduction.  Her surgery was in 2020.  She has noticed some weight change wanted to make sure her breasts were okay.  The left lateral breast area is a little fuller than the right.  It is very soft.  Its not tender.  There is no redness.  The incisions are healing very nicely.  She is very pleased with her result and her progress.  She has several medical issues she is working on.  She will get back to her yearly mammograms that is part of her wellness plan.     Review of Systems  Constitutional: Negative.   HENT: Negative.   Eyes: Negative.   Respiratory: Negative.   Gastrointestinal: Negative.   Endocrine: Negative.   Musculoskeletal: Positive for gait problem.  Skin: Negative.   Hematological: Negative.   Psychiatric/Behavioral: Negative.        Objective:   Physical Exam Vitals and nursing note reviewed.  Constitutional:      Appearance: Normal appearance.  HENT:     Head: Normocephalic and atraumatic.  Cardiovascular:     Rate and Rhythm: Normal rate.     Pulses: Normal pulses.  Pulmonary:     Effort: Pulmonary effort is normal.  Neurological:     General: No focal deficit present.     Mental Status: She is alert and oriented to person, place, and time.  Psychiatric:        Mood and Affect: Mood normal.        Behavior: Behavior normal.        Thought Content: Thought content normal.        Assessment & Plan:     ICD-10-CM   1. Symptomatic mammary hypertrophy  N62     Very happy for Misty [name] and her progress.  Its been a pleasure to take care of her.  We will see her back as needed.  She can go to a regular bra or a sports bra.  Whichever feels more comfortable.  She has agreed to continue with regular mammograms.

## 2019-07-03 IMAGING — US IR FLUORO GUIDE CV LINE*R*
1 series · 2 of 2 positions shown · non-contrast
Comparison: none

INDICATION: 55-year-old female with endometrial cancer. She requires durable
venous access for chemotherapy.

[Series 1: ir fluoro/shunt/fist · 2 of 2 slices shown]
[im 1/2]
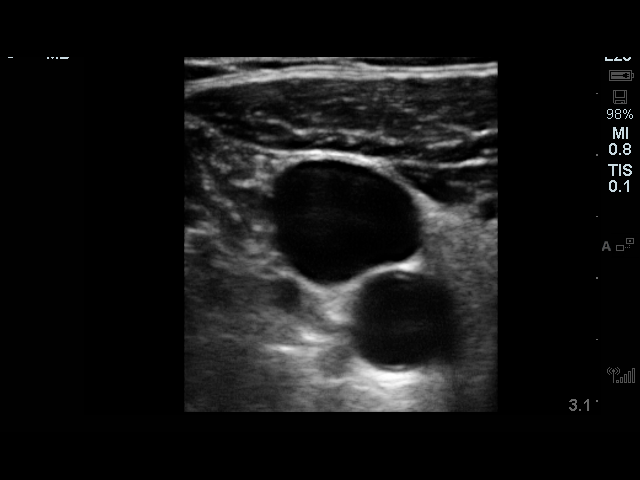
[im 2/2]
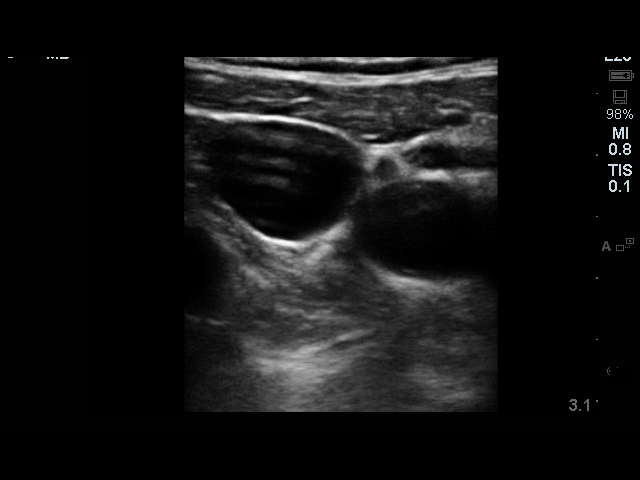

[2 of 2 positions shown; findings below may reference images not displayed]

EXAM:
IMPLANTED PORT A CATH PLACEMENT WITH ULTRASOUND AND FLUOROSCOPIC
GUIDANCE

MEDICATIONS:
2 g Ancef; The antibiotic was administered within an appropriate
time interval prior to skin puncture.

ANESTHESIA/SEDATION:
Versed 2 mg IV; Fentanyl 100 mcg IV;

Moderate Sedation Time:  25 minutes

The patient was continuously monitored during the procedure by the
interventional radiology nurse under my direct supervision.

FLUOROSCOPY TIME:  0 minutes, 24 seconds (less than 1 mGy)

COMPLICATIONS:
None immediate.

PROCEDURE:
The right neck and chest was prepped with chlorhexidine, and draped
in the usual sterile fashion using maximum barrier technique (cap
and mask, sterile gown, sterile gloves, large sterile sheet, hand
hygiene and cutaneous antiseptic). Antibiotic prophylaxis was
provided with 2g Ancef administered IV one hour prior to skin
incision. Local anesthesia was attained by infiltration with 1%
lidocaine with epinephrine.

Ultrasound demonstrated patency of the right internal jugular vein,
and this was documented with an image. Under real-time ultrasound
guidance, this vein was accessed with a 21 gauge micropuncture
needle and image documentation was performed. A small dermatotomy
was made at the access site with an 11 scalpel. A 0.018" wire was
advanced into the SVC and the access needle exchanged for a 4F
micropuncture vascular sheath. The 0.018" wire was then removed and
a 0.035" wire advanced into the IVC.





The pocket was then closed in two layers using first subdermal
inverted interrupted absorbable sutures followed by a running
subcuticular suture. The epidermis was then sealed with Dermabond.
The dermatotomy at the venous access site was also closed with a
single inverted subdermal suture and the epidermis sealed with
Dermabond.
IMPRESSION: Successful placement of a right IJ approach Power Port with
ultrasound and fluoroscopic guidance. The catheter is ready for use.

## 2019-07-05 ENCOUNTER — Other Ambulatory Visit: Payer: Self-pay | Admitting: Neurology

## 2019-07-13 ENCOUNTER — Encounter: Payer: Self-pay | Admitting: Radiation Oncology

## 2019-07-13 ENCOUNTER — Ambulatory Visit
Admission: RE | Admit: 2019-07-13 | Discharge: 2019-07-13 | Disposition: A | Discharge status: Home / Self Care | Payer: Managed Care, Other (non HMO) | Source: Ambulatory Visit | Attending: Radiation Oncology | Admitting: Radiation Oncology

## 2019-07-13 ENCOUNTER — Other Ambulatory Visit: Payer: Self-pay

## 2019-07-13 DIAGNOSIS — Z8542 Personal history of malignant neoplasm of other parts of uterus: Secondary | ICD-10-CM | POA: Insufficient documentation

## 2019-07-13 DIAGNOSIS — Z923 Personal history of irradiation: Secondary | ICD-10-CM | POA: Diagnosis not present

## 2019-07-13 DIAGNOSIS — Z79899 Other long term (current) drug therapy: Secondary | ICD-10-CM | POA: Diagnosis not present

## 2019-07-13 DIAGNOSIS — C541 Malignant neoplasm of endometrium: Secondary | ICD-10-CM

## 2019-07-13 NOTE — Progress Notes (Signed)
Radiation Oncology         [phone] ________________________________  Name: Misty [name] MRN: [id number]  Date: 07/13/2019  DOB: Jun 19, 1960  Follow-Up Visit Note  CC: Shelda Pal, DO  Everitt Amber, MD    ICD-10-CM   1. 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  Radiation treatment dates: 09/07/2016 - 10/07/2016  Site/dose: Vaginal vault / 30 Gy in 5 fractions,  6 Gy per treatment  Narrative: The patient returns today for routine follow-up. Since her last visit, she underwent a bilateral breat reduction on 09/01/2018 performed by Dr. Marla Roe. Pathology from the procedure showed benign breast parenchyma with mild fibrocystic change of bilateral breasts. No carcinoma was noted in either breast.  Breast reduction was performed for chronic problems with neck and upper back pain.  The patient was seen in the ED on 09/03/2018 with complaints of fever and weakness. Chest x-ray at that time showed low lung volumes with presumed post-operative atelectasis at the lung bases. It also showed cardiomegaly, bilateral surgical drains, presumably related to her breast reduction, and subcutaneous gas overlying the chest wall that was also likely related to surgical intervention. CTA of chest at that time showed interval post-surgical changes compatible with bilateral breast reduction recovery. It also showed patchy ground-glass and consolidative opacities particularly within the right lower lobe, possibly representing atelectasis but infection could not be excluded. There was no evidence for acute pulmonary embolus. She was admitted to the hospital for observation given a hemoglobin of 7.4 and a urinalysis that was suggestive of a UTI.  While hospitalized, a lumbar spine MRI was ordered and showed mild degenerative changes in the lumbar spine without  significant stenosis apart from minimal left foraminal narrowing at L2-3. There was no focal lesion or enhancement seen to explain the patient's fever. The patient was treated for pneumonia, stabilized, and discharged to a skilled nursing facility on 09/08/2018.   The patient continues to be followed by plastic surgery regarding breast reduction, endocrinology regarding hirsutism, and neurology regarding neuropathy.  On review of systems, she reports no complaints. She denies vaginal bleeding/discharge.  She denies any pelvic pain or abdominal bloating.  She also denies any hematuria or rectal bleeding         ALLERGIES:  has No Known Allergies.  Meds: Current Outpatient Medications  Medication Sig Dispense Refill  . ALPRAZolam (XANAX) 0.5 MG tablet Take 0.5-1 tablets (0.25-0.5 mg total) by mouth daily as needed for anxiety (FOR PANIC/ANXIETY). 5 tablet 0  . citalopram (CELEXA) 40 MG tablet Take 40 mg daily by mouth.  1  . gabapentin (NEURONTIN) 300 MG capsule TAKE 3 CAPSULES (900 MG TOTAL) BY MOUTH 3 (THREE) TIMES DAILY. 90 capsule 0  . acetaminophen (TYLENOL) 325 MG tablet Take 2 tablets (650 mg total) by mouth every 6 (six) hours as needed for mild pain, fever or headache (fever = >100.4). (Patient not taking: Reported on 07/13/2019) 30 tablet 0  . ondansetron (ZOFRAN) 4 MG tablet Take 1 tablet (4 mg total) by mouth every 8 (eight) hours as needed for nausea or vomiting. (Patient not taking: Reported on 12/06/2018) 20 tablet 0  . polyethylene glycol (MIRALAX / GLYCOLAX) 17 g packet Take 17 g by mouth 2 (two) times daily. (Patient not taking: Reported on 12/06/2018) 14 each 0   No current facility-administered medications for this encounter.  Physical Findings: The patient is in no acute distress. Patient is alert and oriented.  height is 5\' 3"  (1.6 m) and weight is 290 lb 12.8 oz (131.9 kg). Her temperature is 97.8 F (36.6 C). Her blood pressure is 143/88 (abnormal) and her pulse is 63.  Her respiration is 20 and oxygen saturation is 97%.   Lungs are clear to auscultation bilaterally. Heart has regular rate and rhythm. No palpable cervical, supraclavicular, or axillary adenopathy. Abdomen soft, non-tender, normal bowel sounds.  On pelvic examination the external genitalia were unremarkable. A speculum exam was performed. There are no mucosal lesions noted in the vaginal vault. On bimanual examination there were no pelvic masses appreciated.  Lab Findings: Lab Results  Component Value Date   WBC 7.0 09/07/2018   HGB 8.8 (L) 09/07/2018   HCT 29.7 (L) 09/07/2018   MCV 83.7 09/07/2018   PLT 254 09/07/2018    Radiographic Findings: No results found.  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   No evidence of recurrence on clinical exam today.   Plan: Follow-up with Dr. Denman George in 6 months and with radiation oncology in 1 year months.   Total time spent in this encounter was 25 minutes which included reviewing the patient's most recent surgery, ED visit, hospitalization, CT scans, pathology report, follow-ups, physical examination, and documentation.  ____________________________________  Blair Promise, PhD, MD  This document serves as a record of services personally performed by Gery Pray, MD. It was created on his behalf by Clerance Lav, a trained medical scribe. The creation of this record is based on the scribe's personal observations and the provider's statements to them. This document has been checked and approved by the attending provider.

## 2019-07-13 NOTE — Progress Notes (Signed)
Patient here for a f/u visit with Dr. Sondra Come. She denies any sx related to her endometrial cancer  BP (!) 143/88 (BP Location: Left Arm, Patient Position: Sitting, Cuff Size: Normal)   Pulse 63   Temp 97.8 F (36.6 C)   Resp 20   Ht 5\' 3"  (1.6 m)   Wt 290 lb 12.8 oz (131.9 kg)   LMP 05/31/2016 (Approximate)   SpO2 97%   BMI 51.51 kg/m    Wt Readings from Last 3 Encounters:  07/13/19 290 lb 12.8 oz (131.9 kg)  12/06/18 269 lb (122 kg)  11/25/18 272 lb (123.4 kg)

## 2019-10-09 ENCOUNTER — Other Ambulatory Visit: Payer: Self-pay | Admitting: Student in an Organized Health Care Education/Training Program

## 2019-10-09 DIAGNOSIS — Z0000 Encounter for general adult medical examination without abnormal findings: Secondary | ICD-10-CM

## 2019-10-25 ENCOUNTER — Other Ambulatory Visit: Payer: Self-pay

## 2019-10-25 ENCOUNTER — Ambulatory Visit
Admission: RE | Admit: 2019-10-25 | Discharge: 2019-10-25 | Disposition: A | Discharge status: Home / Self Care | Payer: Managed Care, Other (non HMO) | Source: Ambulatory Visit | Attending: Student in an Organized Health Care Education/Training Program | Admitting: Student in an Organized Health Care Education/Training Program

## 2019-10-25 DIAGNOSIS — Z0000 Encounter for general adult medical examination without abnormal findings: Secondary | ICD-10-CM

## 2020-07-18 ENCOUNTER — Ambulatory Visit
Admission: RE | Admit: 2020-07-18 | Discharge: 2020-07-18 | Disposition: A | Discharge status: Home / Self Care | Payer: Managed Care, Other (non HMO) | Source: Ambulatory Visit | Attending: Radiation Oncology | Admitting: Radiation Oncology

## 2020-09-17 ENCOUNTER — Encounter: Payer: Self-pay | Admitting: Internal Medicine

## 2020-09-24 NOTE — Progress Notes (Signed)
Follow-up Note: Gyn-Onc  Consult was requested by Dr. Ulanda [name] for the evaluation of Misty [name] 60 y.o. female  CC:  Chief Complaint  Patient presents with   Endometrial cancer [facility])     Assessment/Plan:  Misty [name]  is a 60 y.o.  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  No active disease that is measurable. Given her LLE edema, we will evaluate with a CT scan to rule out pelvic or nodal recurrence. If stable, she can continue in 6 monthly surveillance exams next in March with Dr Misty [name] and will see our practice, with my partner, Dr Misty [name], in 12 months (September, 2023). If she remains disease free at that time, she can suspend scheduled follow-up.    HPI: Misty [name] is a 60 year old woman who is seen in consultation at the request of Dr Misty [name] for grade 2 endometrial cancer.  The patient reports abnormal uterine bleeding for approximately 6 years (2-4 bleeding episodes per year). She had a particularly heavy bleeding episode on 05/21/16 and was seen in the ED. Hb was 7.2g/dL. She was started on Megace. Her bleeding improved.  [place] on 05/29/16 showed a uterus with 9x6x5.7cm dimensions and an endometrial stripe of 65m.  Endometrial pipelle biopsy on 06/05/16 showed a grade 2 endometrial cancer.   On 06/25/16 she underwent robotic assisted total hysterectomy, BSO SLN biopsy. Final pathology showed a stage IIIC 1 grade 3 endometrioid endometrial cancer with deep myometrial invasion, + LVSI, and both right external iliac and right obturator SLN's (total of 5 in aggregate) positive for metastatic disease.   CT chest/abdo/pelvis on 07/08/16 showed no gross residual disease.  She initially expressed deep concern about adjuvant therapies particularly with a concern that  they may induce lymphedema that was seen in her sister after treatment for breast cancer. She was considering no further treatment with the hope that she would have an enduring disease free interval (8+ years) with no side effects and good quality of life.  After hearing of the benefits of adjuvant therapy and consequences of declining therapy, she elected to undergo 6 cycles of carboplatin and paclitaxel with vaginal brachytherapy (30 Gy in 5 fractions). She completed therapy in November, 2018.  Interval Hx:  She tolerated therapy fairly well though she has persistent neuropathy.   She had indeterminate lymph nodes seen (pelvic) on her pre-treatment imaging in June, 2018. Repeat CT on 06/24/17 showed no evidence of lymphadenopathy.   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 She was referred to endocrinology.   She reported increased edema in the left lower extremity.   Current Meds:  Outpatient Encounter Medications as of 09/25/2020  Medication Sig   ALPRAZolam (XANAX) 0.5 MG tablet Take 0.5-1 tablets (0.25-0.5 mg total) by mouth daily as needed for anxiety (FOR PANIC/ANXIETY).   citalopram (CELEXA) 40 MG tablet Take 40 mg daily by mouth.   acetaminophen (TYLENOL) 325 MG tablet Take 2 tablets (650 mg total) by mouth every 6 (six) hours as needed for mild pain, fever or headache (fever = >100.4). (Patient not taking: No sig reported)   gabapentin (NEURONTIN) 300 MG capsule TAKE 3 CAPSULES (900 MG TOTAL) BY MOUTH 3 (THREE) TIMES DAILY. (Patient not taking: Reported on 09/25/2020)   ondansetron (ZOFRAN) 4 MG tablet Take 1 tablet (4 mg total) by mouth every  8 (eight) hours as needed for nausea or vomiting. (Patient not taking: No sig reported)   polyethylene glycol (MIRALAX / GLYCOLAX) 17 g packet Take 17 g by mouth 2 (two) times daily. (Patient not taking: No sig reported)   No facility-administered encounter medications on file as of 09/25/2020.    Allergy: No Known  Allergies  Social Hx:   Social History   Socioeconomic History   Marital status: Married    Spouse name: Misty [name]   Number of children: 2   Years of education: Not on file   Highest education level: Professional school degree (e.g., MD, DDS, DVM, JD)  Occupational History   Occupation: retired  Tobacco Use   Smoking status: Never   Smokeless tobacco: Never  Vaping Use   Vaping Use: Never used  Substance and Sexual Activity   Alcohol use: Not Currently    Alcohol/week: 1.0 standard drink    Types: 1 Glasses of wine per week   Drug use: No   Sexual activity: Not on file  Other Topics Concern   Not on file  Social History Narrative   Lives with husband in a 2 story home.  Has 2 sons.  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    Right handed   Social Determinants of Health   Financial Resource Strain: Not on file  Food Insecurity: Not on file  Transportation Needs: Not on file  Physical Activity: Not on file  Stress: Not on file  Social Connections: Not on file  Intimate Partner Violence: Not on file    Past Surgical Hx:  Past Surgical History:  Procedure Laterality Date   ANKLE FRACTURE SURGERY Right    BREAST REDUCTION SURGERY Bilateral 09/01/2018   Procedure: MAMMARY REDUCTION  (BREAST);  Surgeon: Wallace Going, DO;  Location: [place];  Service: Plastics;  Laterality: Bilateral;  4 hours, please   CHOLECYSTECTOMY     INGUINAL HERNIA REPAIR Right 08/20/2017   Procedure: OPEN RIGHT INGUINAL HERNIA REPAIR ERA PATHWAY;  Surgeon: Alphonsa Overall, MD;  Location[facility];  Service: General;  Laterality: Right;   INSERTION OF MESH Right 08/20/2017   Procedure: INSERTION OF MESH;  Surgeon: Alphonsa Overall, MD;  [facility];  Service: General;  Laterality: Right;   IR FLUORO GUIDE PORT INSERTION RIGHT  07/31/2016   IR REMOVAL TUN ACCESS W/ PORT W/O FL MOD SED  01/13/2017   IR US GUIDE VASC ACCESS RIGHT  07/31/2016   LAPAROSCOPY N/A 08/20/2017    Procedure: ABDOMINAL LAPAROSCOPY;  Surgeon: Alphonsa Overall, MD;  [facility];  Service: General;  Laterality: N/A;   LYMPH NODE BIOPSY N/A 06/25/2016   Procedure: LYMPH NODE BIOPSY;  Surgeon: Everitt Amber, MD;  [facility];  Service: Gynecology;  Laterality: N/A;   REDUCTION MAMMAPLASTY Bilateral    ROBOTIC ASSISTED TOTAL HYSTERECTOMY WITH BILATERAL SALPINGO OOPHERECTOMY Bilateral 06/25/2016   Procedure: XI ROBOTIC ASSISTED TOTAL HYSTERECTOMY WITH BILATERAL SALPINGO OOPHORECTOMY for uterus greater 250 grams ;  Surgeon: Everitt Amber, MD;  [facility];  Service: Gynecology;  Laterality: Bilateral;   TONSILLECTOMY     UMBILICAL HERNIA REPAIR N/A 08/20/2017   Procedure: HERNIA REPAIR UMBILICAL ADULT;  Surgeon: Alphonsa Overall, MD;  Location[facility];  Service: General;  Laterality: N/A;    Past Medical Hx:  Past Medical History:  Diagnosis Date   Ambulates with cane    Anemia    prior to hysterectomy   Ankle fracture  Anxiety    Aortic atherosclerosis (HCC)    Cardiomegaly    Mild   Endometrial cancer (HCC)    Family history of brain cancer    Family history of breast cancer    Family history of colon cancer    Family history of colonic polyps    Family history of prostate cancer    History of chemotherapy    History of gallstones    History of hiatal hernia    History of radiation therapy 09/07/16-10/07/16   HDR to vaginal vault 30 gy in 5 fractions   Hypertension    No BP medications, related more to anxiety   Inguinal hernia    Right   Neuropathy    Chemo induce, feet and fingers   Paraumbilical hernia    Small    Past Gynecological History:  SVD x 2 Patient's last menstrual period was 05/31/2016 (approximate).  Family Hx:  Family History  Problem Relation Age of Onset   Breast cancer Sister 53   Colon cancer Paternal Grandfather 45   Breast cancer Sister 45       Negative genetic testing on a 46 gene panel through [facility]   Thyroid cancer Sister 39       Lung  cancer   Fibroids Mother    Colon polyps Father    Healthy Brother    Breast cancer Maternal Aunt 55   Prostate cancer Maternal Uncle 41   Healthy Paternal Aunt    Heart disease Maternal Grandmother    Stroke Paternal Grandmother    Colon polyps Sister    Colon polyps Sister    Healthy Sister    Healthy Brother    Brain cancer Maternal Aunt 31       died 3 days after diagnosis   Breast cancer Maternal Aunt 80   Healthy Paternal 52     Patient's oldest and youngest sister have history of premenopausal breast cancer (she is one of 8 siblings).  Review of Systems:  Constitutional  Feels well,    ENT Normal appearing ears and nares bilaterally Skin/Breast  + female pattern baldness and facial hair.  Cardiovascular  No chest pain, shortness of breath, or edema  Pulmonary  No cough or wheeze.  Gastro Intestinal  No nausea, vomitting, or diarrhoea. No bright red blood per rectum, no abdominal pain, change in bowel movement, or constipation. + flatulence Genito Urinary  No frequency, urgency, dysuria, no vaginal bleeding. Musculo Skeletal  No myalgia, arthralgia, joint swelling or pain  Neurologic  No weakness, numbness, change in gait,  Psychology  No depression, anxiety, insomnia.   labratory assessment: CBC    Component Value Date/Time   WBC 7.0 09/07/2018 0800   RBC 3.55 (L) 09/07/2018 0800   HGB 8.8 (L) 09/07/2018 0800   HGB 11.5 (L) 11/18/2016 0920   HCT 29.7 (L) 09/07/2018 0800   HCT 24.0 (L) 09/03/2018 0835   HCT 36.2 11/18/2016 0920   PLT 254 09/07/2018 0800   PLT 156 11/18/2016 0920   MCV 83.7 09/07/2018 0800   MCV 89.4 11/18/2016 0920   MCH 24.8 (L) 09/07/2018 0800   MCHC 29.6 (L) 09/07/2018 0800   RDW 19.1 (H) 09/07/2018 0800   RDW 21.7 (H) 11/18/2016 0920   LYMPHSABS 1.1 09/07/2018 0800   LYMPHSABS 1.4 11/18/2016 0920   MONOABS 0.8 09/07/2018 0800   MONOABS 1.0 (H) 11/18/2016 0920   EOSABS 0.4 09/07/2018 0800   EOSABS 0.0 11/18/2016 0920    BASOSABS 0.1 09/07/2018  0800   BASOSABS 0.0 11/18/2016 0920    Vitals:  Blood pressure (!) 155/87, pulse 66, temperature 98.2 F (36.8 C), temperature source Oral, resp. rate 18, height '5\' 3"'$  (1.6 m), weight 281 lb (127.5 kg), last menstrual period 05/31/2016, SpO2 100 %.  Physical Exam: WD in NAD Neck  Supple NROM, without any enlargements.  Lymph Node Survey No cervical supraclavicular or inguinal adenopathy Cardiovascular  Pulse normal rate, regularity and rhythm. S1 and S2 normal.  Lungs  Clear to auscultation bilateraly, without wheezes/crackles/rhonchi. Good air movement.  Skin  + female pattern baldness, facial hair.  Psychiatry  Alert and oriented to person, place, and time  Abdomen  Normoactive bowel sounds, abdomen soft, non-tender and obese with pannus with candida without vidence of hernia. Incisions well healed. Back No CVA tenderness Genito Urinary  Vulva/vagina: Normal external female genitalia.  No lesions. No discharge or bleeding.  Bladder/urethra:  No lesions or masses, well supported bladder  Vagina: normal, some prolapse. No masses. Vaginal cuff well healed. No blood. No visible lesions  Adnexa: no discretely palpable masses. Rectal  deferred Extremities  + mild left lower extremity edema, well defined ankles   Thereasa Solo, MD  09/25/2020, 11:34 AM

## 2020-09-25 ENCOUNTER — Inpatient Hospital Stay: Payer: Managed Care, Other (non HMO)

## 2020-09-25 ENCOUNTER — Inpatient Hospital Stay: Payer: Managed Care, Other (non HMO) | Attending: Gynecologic Oncology | Admitting: Gynecologic Oncology

## 2020-09-25 ENCOUNTER — Other Ambulatory Visit: Payer: Self-pay

## 2020-09-25 VITALS — BP 155/87 | HR 66 | Temp 98.2°F | Resp 18 | Ht 63.0 in | Wt 281.0 lb

## 2020-09-25 DIAGNOSIS — Z90710 Acquired absence of both cervix and uterus: Secondary | ICD-10-CM | POA: Diagnosis not present

## 2020-09-25 DIAGNOSIS — C541 Malignant neoplasm of endometrium: Secondary | ICD-10-CM

## 2020-09-25 DIAGNOSIS — R600 Localized edema: Secondary | ICD-10-CM

## 2020-09-25 DIAGNOSIS — Z90722 Acquired absence of ovaries, bilateral: Secondary | ICD-10-CM | POA: Insufficient documentation

## 2020-09-25 DIAGNOSIS — Z923 Personal history of irradiation: Secondary | ICD-10-CM | POA: Diagnosis not present

## 2020-09-25 DIAGNOSIS — Z8542 Personal history of malignant neoplasm of other parts of uterus: Secondary | ICD-10-CM | POA: Diagnosis present

## 2020-09-25 DIAGNOSIS — Z9221 Personal history of antineoplastic chemotherapy: Secondary | ICD-10-CM | POA: Insufficient documentation

## 2020-09-25 LAB — BASIC METABOLIC PANEL
Anion gap: 8 (ref 5–15)
BUN: 9 mg/dL (ref 8–23)
CO2: 26 mmol/L (ref 22–32)
Calcium: 9 mg/dL (ref 8.9–10.3)
Chloride: 106 mmol/L (ref 98–111)
Creatinine, Ser: 0.7 mg/dL (ref 0.44–1.00)
GFR, Estimated: >60 mL/min (ref >60)
Glucose, Bld: 88 mg/dL (ref 70–99)
Potassium: 5.1 mmol/L (ref 3.5–5.1)
Sodium: 140 mmol/L (ref 135–145)

## 2020-09-25 NOTE — Patient Instructions (Signed)
Dr Denman George is ordering a CT scan to evaluate your lower extremity edema to rule out enlarging pelvic masses or lymph nodes as a cause.  If this is negative, please follow-up with Dr Sondra Come in 6 months.  Please have Dr Clabe Seal office contact Dr Serita Grit office (at [phone]) in March after your appointment with him to request an appointment with Dr Serita Grit partner, Dr Berline Lopes in September, 2023. If you remain cancer free at that time, you will be able to suspend scheduled surveillance visits at the cancer center.

## 2020-10-02 ENCOUNTER — Ambulatory Visit (HOSPITAL_COMMUNITY)
Admission: RE | Admit: 2020-10-02 | Discharge: 2020-10-02 | Disposition: A | Discharge status: Home / Self Care | Payer: Managed Care, Other (non HMO) | Source: Ambulatory Visit | Attending: Gynecologic Oncology | Admitting: Gynecologic Oncology

## 2020-10-02 ENCOUNTER — Other Ambulatory Visit: Payer: Self-pay

## 2020-10-02 DIAGNOSIS — R600 Localized edema: Secondary | ICD-10-CM | POA: Diagnosis present

## 2020-10-02 DIAGNOSIS — C541 Malignant neoplasm of endometrium: Secondary | ICD-10-CM | POA: Diagnosis present

## 2020-10-02 MED ORDER — IOHEXOL 350 MG/ML SOLN
80.0000 mL | Freq: Once | INTRAVENOUS | Status: AC | PRN
  Administered 2020-10-02: 80 mL via INTRAVENOUS

## 2020-10-04 ENCOUNTER — Telehealth: Payer: Self-pay

## 2020-10-04 NOTE — Telephone Encounter (Signed)
Spoke with patient regarding her CT Scan on 10/02/20. Per Joylene John, NP no findings to suggest return of cancer. Findings of hiatal hernia and atherosclerotic calcification to be followed up with PCP. Results will be sent to PCP Dr. Nani Ravens. Patient verbalized understanding and will call with questions or concerns.

## 2020-10-10 ENCOUNTER — Encounter: Payer: Self-pay | Admitting: Internal Medicine

## 2020-10-10 ENCOUNTER — Ambulatory Visit (INDEPENDENT_AMBULATORY_CARE_PROVIDER_SITE_OTHER): Payer: Managed Care, Other (non HMO) | Admitting: Internal Medicine

## 2020-10-10 VITALS — BP 130/70 | HR 62 | Ht 63.0 in | Wt 270.0 lb

## 2020-10-10 DIAGNOSIS — Z1211 Encounter for screening for malignant neoplasm of colon: Secondary | ICD-10-CM

## 2020-10-10 MED ORDER — NA SULFATE-K SULFATE-MG SULF 17.5-3.13-1.6 GM/177ML PO SOLN: 1.0000 | Freq: Once | ORAL | 0 refills | Status: AC

## 2020-10-10 NOTE — Patient Instructions (Signed)
You have been scheduled for a colonoscopy. Please follow written instructions given to you at your visit today.  Please pick up your prep supplies at the pharmacy within the next 1-3 days. If you use inhalers (even only as needed), please bring them with you on the day of your procedure.  If you are age 61 or older, your body mass index should be between 23-30. Your Body mass index is 47.83 kg/m. If this is out of the aforementioned range listed, please consider follow up with your Primary Care Provider.  If you are age 20 or younger, your body mass index should be between 19-25. Your Body mass index is 47.83 kg/m. If this is out of the aformentioned range listed, please consider follow up with your Primary Care Provider.   __________________________________________________________  The [place] [facility] providers would like to encourage you to use [facility] to communicate with providers for non-urgent requests or questions.  Due to long hold times on the telephone, sending your provider a message by [facility] may be a faster and more efficient way to get a response.  Please allow 48 business hours for a response.  Please remember that this is for non-urgent requests.

## 2020-10-10 NOTE — Progress Notes (Signed)
Chief Complaint: Colon cancer screening  HPI : 60 year old female with history of endometrial cancer in remission, obesity, hiatal hernia, HTN presents to discuss colon cancer screening  She is here to talk about colon cancer screening because she knows that she is several years overdue. Denies hematochezia, melena, weight loss, changes in bowel habits. On average she has one BM per day. Endorses fam hx of colon polyps (not sure what kind). Denies fam hx of colon cancer. Has had umbilical hernia repair, cholecystectomy, and hysterectomy in the past. Denies prior colonoscopy. Denies use of blood thinners. Denies other GI issues.  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   Past Medical History:  Diagnosis Date   Ambulates with cane    Anemia    prior to hysterectomy   Ankle fracture    Anxiety    Aortic atherosclerosis (HCC)    Cardiomegaly    Mild   Endometrial cancer (HCC)    Family history of brain cancer    Family history of breast cancer    Family history of colon cancer    Family history of colonic polyps    Family history of prostate cancer    History of chemotherapy    History of gallstones    History of hiatal hernia    History of radiation therapy 09/07/16-10/07/16   HDR to vaginal vault 30 gy in 5 fractions   Hyperlipidemia    Hypertension    No BP medications, related more to anxiety   Inguinal hernia    Right   Neuropathy    Chemo induce, feet and fingers   Obesity    Paraumbilical hernia    Small     Past Surgical History:  Procedure Laterality Date   ANKLE FRACTURE SURGERY Right    BREAST REDUCTION SURGERY Bilateral 09/01/2018   Procedure: MAMMARY REDUCTION  (BREAST);  Surgeon: Wallace Going, DO;  Location: [place];  Service: Plastics;  Laterality: Bilateral;  4 hours, please   CHOLECYSTECTOMY     INGUINAL HERNIA REPAIR Right 08/20/2017   Procedure: OPEN RIGHT INGUINAL HERNIA REPAIR ERA PATHWAY;  Surgeon: Alphonsa Overall, MD;  [facility];  Service: General;  Laterality: Right;   INSERTION OF MESH Right 08/20/2017   Procedure: INSERTION OF MESH;  Surgeon: Alphonsa Overall, MD;  [facility];  Service: General;  Laterality: Right;   IR FLUORO GUIDE PORT INSERTION RIGHT  07/31/2016   IR REMOVAL TUN ACCESS W/ PORT W/O FL MOD SED  01/13/2017   IR US GUIDE VASC ACCESS RIGHT  07/31/2016   LAPAROSCOPY N/A 08/20/2017   Procedure: ABDOMINAL LAPAROSCOPY;  Surgeon: Alphonsa Overall, MD;  [facility];  Service: General;  Laterality: N/A;   LYMPH NODE BIOPSY N/A 06/25/2016   Procedure: LYMPH NODE BIOPSY;  Surgeon: Everitt Amber, MD;  [facility];  Service: Gynecology;  Laterality: N/A;   REDUCTION MAMMAPLASTY Bilateral    ROBOTIC ASSISTED TOTAL HYSTERECTOMY WITH BILATERAL SALPINGO OOPHERECTOMY Bilateral 06/25/2016   Procedure: XI ROBOTIC ASSISTED TOTAL HYSTERECTOMY WITH BILATERAL SALPINGO OOPHORECTOMY for uterus greater 250 grams ;  Surgeon: Everitt Amber, MD;  [facility];  Service: Gynecology;  Laterality: Bilateral;   TONSILLECTOMY     UMBILICAL HERNIA REPAIR N/A 08/20/2017   Procedure: HERNIA REPAIR UMBILICAL ADULT;  Surgeon: Alphonsa Overall, MD;  [facility];  Service: General;  Laterality: N/A;   Family History  Problem Relation Age of Onset  Fibroids Mother    Colon polyps Father    Breast cancer Sister 61   Breast cancer Sister 21       Negative genetic testing on a 54 gene panel through [facility]   Thyroid cancer Sister 24       Lung cancer   Colon polyps Sister    Colon polyps Sister    Healthy Sister    Healthy Brother    Healthy Brother    Heart disease Maternal Grandmother    Stroke Paternal Grandmother    Colon cancer Paternal Grandfather 45   Breast cancer Maternal Aunt 19   Brain cancer Maternal Aunt 61       died 3 days after diagnosis   Breast cancer Maternal Aunt 80   Prostate cancer Maternal Uncle 77   Healthy Paternal Aunt    Healthy Paternal Aunt    Social History    Tobacco Use   Smoking status: Never   Smokeless tobacco: Never  Vaping Use   Vaping Use: Never used  Substance Use Topics   Alcohol use: Not Currently    Alcohol/week: 1.0 standard drink    Types: 1 Glasses of wine per week   Drug use: No   Current Outpatient Medications  Medication Sig Dispense Refill   ALPRAZolam (XANAX) 0.5 MG tablet Take 0.5-1 tablets (0.25-0.5 mg total) by mouth daily as needed for anxiety (FOR PANIC/ANXIETY). 5 tablet 0   citalopram (CELEXA) 40 MG tablet Take 40 mg daily by mouth.  1   Na Sulfate-K Sulfate-Mg Sulf 17.5-3.13-1.6 GM/177ML SOLN Take 1 kit by mouth once for 1 dose. 354 mL 0   No current facility-administered medications for this visit.   No Known Allergies   Review of Systems: All systems reviewed and negative except where noted in HPI.   Physical Exam: BP 130/70   Pulse 62   Ht '5\' 3"'  (1.6 m)   Wt 270 lb (122.5 kg)   LMP 05/31/2016 (Approximate)   BMI 47.83 kg/m  Constitutional: Pleasant,well-developed, female in no acute distress. HEENT: Normocephalic and atraumatic. Conjunctivae are normal. No scleral icterus. Cardiovascular: Normal rate, regular rhythm.  Pulmonary/chest: Effort normal and breath sounds normal. No wheezing, rales or rhonchi. Abdominal: Soft, nondistended, nontender. Bowel sounds active throughout. Extremities: No edema Neurological: Alert and oriented to person place and time. Skin: Skin is warm and dry. Psychiatric: Normal mood and affect. Behavior is normal.  Labs 09/2020: BMP normal  CT Abdomen Pelvis W Contrast Result Date: 10/03/2020 FINDINGS: Lower chest: Moderate to large hiatal hernia. Hepatobiliary: No suspicious focal abnormality within the liver parenchyma. Gallbladder is surgically absent. No intrahepatic or extrahepatic biliary dilation. Pancreas: No focal mass lesion. No dilatation of the main duct. No intraparenchymal cyst. No peripancreatic edema. Spleen: No splenomegaly. No focal mass lesion.  Adrenals/Urinary Tract: No adrenal nodule or mass. Kidneys unremarkable. No evidence for hydroureter. The urinary bladder appears normal for the degree of distention. Stomach/Bowel: Moderate to large hiatal hernia. Stomach otherwise unremarkable. Duodenum is normally positioned as is the ligament of Treitz. No small bowel wall thickening. No small bowel dilatation. The terminal ileum is normal. The appendix is best seen on coronal images and is unremarkable. No gross colonic mass. No colonic wall thickening. Vascular/Lymphatic: There is moderate atherosclerotic calcification of the abdominal aorta without aneurysm. There is no gastrohepatic or hepatoduodenal ligament lymphadenopathy. No retroperitoneal or mesenteric lymphadenopathy. No pelvic sidewall lymphadenopathy. No pelvic sidewall mass lesion. Assessment of iliac venous anatomy in the pelvis is limited by bolus  timing, but there is no evidence for venous enlargement or perivascular edema to suggest the presence of an acute thrombosis in the external or common iliac veins. Reproductive: Uterus surgically absent.  There is no adnexal mass. Other: No intraperitoneal free fluid. Musculoskeletal: No worrisome lytic or sclerotic osseous abnormality. IMPRESSION: 1. No findings to suggest recurrent or metastatic disease in the abdomen/pelvis. 2. No findings along the pelvic sidewall to explain the patient's history of new left lower extremity edema. Assessment of the left common iliac and external iliac veins is limited by bolus timing, but there is no venous enlargement or perivascular edema to suggest the presence of an acute venous thrombus.   ASSESSMENT AND PLAN: Colon cancer screening Patient presents for normal risk colon cancer screening. Went over the risks and benefits of colonoscopy. Patient is agreeable to proceed. - Colonoscopy [facility]  [name], MD

## 2020-11-07 ENCOUNTER — Ambulatory Visit (AMBULATORY_SURGERY_CENTER): Payer: Managed Care, Other (non HMO) | Admitting: Internal Medicine

## 2020-11-07 ENCOUNTER — Encounter: Payer: Self-pay | Admitting: Internal Medicine

## 2020-11-07 VITALS — BP 127/79 | HR 63 | Temp 98.0°F | Resp 14 | Ht 63.0 in | Wt 270.0 lb

## 2020-11-07 DIAGNOSIS — D120 Benign neoplasm of cecum: Secondary | ICD-10-CM | POA: Diagnosis not present

## 2020-11-07 DIAGNOSIS — Z1211 Encounter for screening for malignant neoplasm of colon: Secondary | ICD-10-CM | POA: Diagnosis not present

## 2020-11-07 DIAGNOSIS — D122 Benign neoplasm of ascending colon: Secondary | ICD-10-CM

## 2020-11-07 DIAGNOSIS — K649 Unspecified hemorrhoids: Secondary | ICD-10-CM

## 2020-11-07 DIAGNOSIS — D125 Benign neoplasm of sigmoid colon: Secondary | ICD-10-CM

## 2020-11-07 MED ORDER — SODIUM CHLORIDE 0.9 % IV SOLN: 500.0000 mL | Freq: Once | INTRAVENOUS | Status: DC

## 2020-11-07 NOTE — Progress Notes (Signed)
Check-in-AM  V/S-CW

## 2020-11-07 NOTE — Op Note (Addendum)
[facility] Patient Name: Misty [name] Procedure Date: 11/07/2020 10:14 AM MRN: [id number] Endoscopist: Sonny Masters "Misty [name] ,  Age: 60 Referring MD:  Date of Birth: November 14, 1960 Gender: Female Account #: [id number] Procedure:                Colonoscopy Indications:              Screening for colorectal malignant neoplasm Medicines:                Monitored Anesthesia Care Procedure:                Pre-Anesthesia Assessment:                           - Prior to the procedure, a History and Physical                            was performed, and patient medications and                            allergies were reviewed. The patient's tolerance of                            previous anesthesia was also reviewed. The risks                            and benefits of the procedure and the sedation                            options and risks were discussed with the patient.                            All questions were answered, and informed consent                            was obtained. Prior Anticoagulants: The patient has                            taken no previous anticoagulant or antiplatelet                            agents. ASA Grade Assessment: III - A patient with                            severe systemic disease. After reviewing the risks                            and benefits, the patient was deemed in                            satisfactory condition to undergo the procedure.                           After obtaining informed consent, the colonoscope  was passed under direct vision. Throughout the                            procedure, the patient's blood pressure, pulse, and                            oxygen saturations were monitored continuously. The                            Olympus CF-HQ190L [id number]) Colonoscope was                            introduced through the anus and advanced to the the                            terminal  ileum. The colonoscopy was performed                            without difficulty. The patient tolerated the                            procedure well. The quality of the bowel                            preparation was good. Scope In: 10:44:19 AM Scope Out: 11:14:49 AM Scope Withdrawal Time: 0 hours 26 minutes 21 seconds  Total Procedure Duration: 0 hours 30 minutes 30 seconds  Findings:                 The terminal ileum appeared normal.                           A 4 mm polyp was found in the cecum. The polyp was                            sessile. The polyp was removed with a hot snare.                            Resection and retrieval were complete.                           Two sessile polyps were found in the ascending                            colon and cecum. The polyps were 5 to 7 mm in size.                            These polyps were removed with a cold snare.                            Resection and retrieval were complete.                           A 8 mm polyp was  found in the sigmoid colon. The                            polyp was pedunculated. The polyp was removed with                            a hot snare. Resection and retrieval were complete.                           Non-bleeding internal hemorrhoids were found during                            retroflexion. Complications:            No immediate complications. Estimated Blood Loss:     Estimated blood loss was minimal. Impression:               - The examined portion of the ileum was normal.                           - One 4 mm polyp in the cecum, removed with a hot                            snare. Resected and retrieved.                           - Two 5 to 7 mm polyps in the ascending colon and                            in the cecum, removed with a cold snare. Resected                            and retrieved.                           - One 8 mm polyp in the sigmoid colon, removed with                             a hot snare. Resected and retrieved.                           - Non-bleeding internal hemorrhoids. Recommendation:           - Discharge patient to home (with escort).                           - Await pathology results.                           - The findings and recommendations were discussed                            with the patient. Sonny Masters "Misty [name],  11/07/2020 11:20:33 AM

## 2020-11-07 NOTE — Progress Notes (Signed)
No problems noted in the recovery room. maw   Vocal Order from Dr. Henriette Combs, "NO ASPIRIN, ASPIRIN CONTAINING PRODUCTS (BC OR GOODY POWDERS) OR NSAIDS (IBUPROFEN, ADVIL, ALEVE, AND MOTRIN) FOR 2 weeks; TYLENOL IS OK TO TAKE. "  Message was given and written on AVS. With pt and her spouse.    Pt needed to urinate while she was still on the monitor.  I assisted her with bedpan.  I also assited cleaning pt and dressing her after 20 minutes was up. maw

## 2020-11-07 NOTE — Patient Instructions (Signed)
Handouts were given to your care partner on polyps and hemorrhoids. NO ASPIRIN, ASPIRIN CONTAINING PRODUCTS (BC OR GOODY POWDERS) OR NSAIDS (IBUPROFEN, ADVIL, ALEVE, AND MOTRIN) FOR  2 weeks; TYLENOL IS OK TO TAKE. You may resume your current medications today. Await biopsy results.  May take 1-3 weeks to receive pathology results. Please call if any questions or concerns.     YOU HAD AN ENDOSCOPIC PROCEDURE TODAY AT [facility]:   Refer to the procedure report that was given to you for any specific questions about what was found during the examination.  If the procedure report does not answer your questions, please call your gastroenterologist to clarify.  If you requested that your care partner not be given the details of your procedure findings, then the procedure report has been included in a sealed envelope for you to review at your convenience later.  YOU SHOULD EXPECT: Some feelings of bloating in the abdomen. Passage of more gas than usual.  Walking can help get rid of the air that was put into your GI tract during the procedure and reduce the bloating. If you had a lower endoscopy (such as a colonoscopy or flexible sigmoidoscopy) you may notice spotting of blood in your stool or on the toilet paper. If you underwent a bowel prep for your procedure, you may not have a normal bowel movement for a few days.  Please Note:  You might notice some irritation and congestion in your nose or some drainage.  This is from the oxygen used during your procedure.  There is no need for concern and it should clear up in a day or so.  SYMPTOMS TO REPORT IMMEDIATELY:  Following lower endoscopy (colonoscopy or flexible sigmoidoscopy):  Excessive amounts of blood in the stool  Significant tenderness or worsening of abdominal pains  Swelling of the abdomen that is new, acute  Fever of 100F or higher  For urgent or emergent issues, a gastroenterologist can be reached at any hour by calling  [phone]. Do not use MyChart messaging for urgent concerns.    DIET:  We do recommend a small meal at first, but then you may proceed to your regular diet.  Drink plenty of fluids but you should avoid alcoholic beverages for 24 hours.  ACTIVITY:  You should plan to take it easy for the rest of today and you should NOT DRIVE or use heavy machinery until tomorrow (because of the sedation medicines used during the test).    FOLLOW UP: Our staff will call the number listed on your records 48-72 hours following your procedure to check on you and address any questions or concerns that you may have regarding the information given to you following your procedure. If we do not reach you, we will leave a message.  We will attempt to reach you two times.  During this call, we will ask if you have developed any symptoms of COVID 19. If you develop any symptoms (ie: fever, flu-like symptoms, shortness of breath, cough etc.) before then, please call [phone].  If you test positive for Covid 19 in the 2 weeks post procedure, please call and report this information to [place].    If any biopsies were taken you will be contacted by phone or by letter within the next 1-3 weeks.  Please call us at [phone] if you have not heard about the biopsies in 3 weeks.    SIGNATURES/CONFIDENTIALITY: You and/or your care partner have signed paperwork which will  be entered into your electronic medical record.  These signatures attest to the fact that that the information above on your After Visit Summary has been reviewed and is understood.  Full responsibility of the confidentiality of this discharge information lies with you and/or your care-partner.

## 2020-11-07 NOTE — Progress Notes (Signed)
Called to room to assist during endoscopic procedure.  Patient ID and intended procedure confirmed with present staff. Received instructions for my participation in the procedure from the performing physician.  

## 2020-11-07 NOTE — Progress Notes (Signed)
Report given to PACU, vss 

## 2020-11-07 NOTE — Progress Notes (Signed)
GASTROENTEROLOGY PROCEDURE H&P NOTE   Primary Care Physician: Pcp, No    Reason for Procedure:   Colon cancer screening  Plan:    Colonoscopy  Patient is appropriate for endoscopic procedure(s) in the ambulatory ([facility]) setting.  The nature of the procedure, as well as the risks, benefits, and alternatives were carefully and thoroughly reviewed with the patient. Ample time for discussion and questions allowed. The patient understood, was satisfied, and agreed to proceed.     HPI: Misty [name] is a 60 y.o. female who presents for colonoscopy for colon cancer screening .  Patient was most recently seen in the Gastroenterology Clinic on 10/10/20.  No interval change in medical history since that appointment. Please refer to that note for full details regarding GI history and clinical presentation.   Past Medical History:  Diagnosis Date   Ambulates with cane    Anemia    prior to hysterectomy   Ankle fracture    Anxiety    Aortic atherosclerosis (HCC)    Cardiomegaly    Mild   Endometrial cancer (HCC)    Family history of brain cancer    Family history of breast cancer    Family history of colon cancer    Family history of colonic polyps    Family history of prostate cancer    History of chemotherapy    History of gallstones    History of hiatal hernia    History of radiation therapy 09/07/16-10/07/16   HDR to vaginal vault 30 gy in 5 fractions   Hyperlipidemia    Hypertension    No BP medications, related more to anxiety   Inguinal hernia    Right   Neuropathy    Chemo induce, feet and fingers   Obesity    Paraumbilical hernia    Small    Past Surgical History:  Procedure Laterality Date   ANKLE FRACTURE SURGERY Right    BREAST REDUCTION SURGERY Bilateral 09/01/2018   Procedure: MAMMARY REDUCTION  (BREAST);  Surgeon: Wallace Going, DO;  Location: [facility];  Service: Plastics;  Laterality: Bilateral;  4 hours, please   CHOLECYSTECTOMY     INGUINAL  HERNIA REPAIR Right 08/20/2017   Procedure: OPEN RIGHT INGUINAL HERNIA REPAIR ERA PATHWAY;  Surgeon: Alphonsa Overall, MD;  Location: [facility];  Service: General;  Laterality: Right;   INSERTION OF MESH Right 08/20/2017   Procedure: INSERTION OF MESH;  Surgeon: Alphonsa Overall, MD;  Location: [facility];  Service: General;  Laterality: Right;   IR FLUORO GUIDE PORT INSERTION RIGHT  07/31/2016   IR REMOVAL TUN ACCESS W/ PORT W/O FL MOD SED  01/13/2017   IR US GUIDE VASC ACCESS RIGHT  07/31/2016   LAPAROSCOPY N/A 08/20/2017   Procedure: ABDOMINAL LAPAROSCOPY;  Surgeon: Alphonsa Overall, MD;  Location: [facility];  Service: General;  Laterality: N/A;   LYMPH NODE BIOPSY N/A 06/25/2016   Procedure: LYMPH NODE BIOPSY;  Surgeon: Everitt Amber, MD;  Location: [facility];  Service: Gynecology;  Laterality: N/A;   REDUCTION MAMMAPLASTY Bilateral    ROBOTIC ASSISTED TOTAL HYSTERECTOMY WITH BILATERAL SALPINGO OOPHERECTOMY Bilateral 06/25/2016   Procedure: XI ROBOTIC ASSISTED TOTAL HYSTERECTOMY WITH BILATERAL SALPINGO OOPHORECTOMY for uterus greater 250 grams ;  Surgeon: Everitt Amber, MD;  Location: [facility];  Service: Gynecology;  Laterality: Bilateral;   TONSILLECTOMY     UMBILICAL HERNIA REPAIR N/A 08/20/2017   Procedure: HERNIA REPAIR UMBILICAL ADULT;  Surgeon: Alphonsa Overall, MD;  Location: [facility];  Service: General;  Laterality: N/A;    Prior to Admission medications   Medication Sig Start Date End Date Taking? Authorizing Provider  ALPRAZolam (XANAX) 0.5 MG tablet Take 0.5-1 tablets (0.25-0.5 mg total) by mouth daily as needed for anxiety (FOR PANIC/ANXIETY). 09/08/18   Raiford Noble Latif, DO  citalopram (CELEXA) 40 MG tablet Take 40 mg daily by mouth. 11/25/16   [provider]    Current Outpatient Medications  Medication Sig Dispense Refill   ALPRAZolam (XANAX) 0.5 MG tablet Take 0.5-1 tablets (0.25-0.5 mg total) by mouth daily as needed for anxiety (FOR PANIC/ANXIETY). 5 tablet 0   citalopram (CELEXA) 40 MG tablet Take 40  mg daily by mouth.  1   Current Facility-Administered Medications  Medication Dose Route Frequency Provider Last Rate Last Admin   0.9 %  sodium chloride infusion  500 mL Intravenous Once Sharyn Creamer, MD        Allergies as of 11/07/2020   (No Known Allergies)    Family History  Problem Relation Age of Onset   Fibroids Mother    Colon polyps Father    Breast cancer Sister 41   Breast cancer Sister 95       Negative genetic testing on a 46 gene panel through [facility]   Thyroid cancer Sister 24       Lung cancer   Colon polyps Sister    Colon polyps Sister    Healthy Sister    Healthy Brother    Healthy Brother    Heart disease Maternal Grandmother    Stroke Paternal Grandmother    Colon cancer Paternal Grandfather 45   Breast cancer Maternal Aunt 87   Brain cancer Maternal Aunt 56       died 3 days after diagnosis   Breast cancer Maternal Aunt 80   Prostate cancer Maternal Uncle 77   Healthy Paternal Aunt    Healthy Paternal Aunt     Social History   Socioeconomic History   Marital status: Married    Spouse name: [name]   Number of children: 2   Years of education: Not on file   Highest education level: Professional school degree (e.g., MD, DDS, DVM, JD)  Occupational History   Occupation: retired  Tobacco Use   Smoking status: Never   Smokeless tobacco: Never  Vaping Use   Vaping Use: Never used  Substance and Sexual Activity   Alcohol use: Not Currently    Alcohol/week: 1.0 standard drink    Types: 1 Glasses of wine per week   Drug use: No   Sexual activity: Not on file  Other Topics Concern   Not on file  Social History Narrative   Lives with husband in a 2 story home.  Has 2 sons.  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    Right handed   Social Determinants of Health   Financial Resource Strain: Not on file  Food Insecurity: Not on file  Transportation Needs: Not on file  Physical Activity: Not on file  Stress:  Not on file  Social Connections: Not on file  Intimate Partner Violence: Not on file    Physical Exam: Vital signs in last 24 hours: BP (!) 162/79 (Patient Position: Sitting)   Pulse 63   Temp 98 F (36.7 C)   Ht 5\' 3"  (1.6 m)   Wt 270 lb (122.5 kg)   LMP 05/31/2016 (Approximate)   SpO2 98%   BMI 47.83 kg/m  GEN: NAD EYE:  Sclerae anicteric ENT: MMM CV: Non-tachycardic Pulm: No increased WOB GI: Soft NEURO:  Alert & Oriented   [name], MD [facility]   11/07/2020 10:24 AM

## 2020-11-12 ENCOUNTER — Telehealth: Payer: Self-pay

## 2020-11-12 NOTE — Telephone Encounter (Signed)
No answer, left message to call if having any issues or concerns, B.Seline Enzor, RN. 

## 2020-11-16 ENCOUNTER — Encounter: Payer: Self-pay | Admitting: Internal Medicine

## 2021-03-26 NOTE — Progress Notes (Signed)
?Radiation Oncology         [phone] ?________________________________ ? ?Name: Misty [name] MRN: [id number]  ?Date: 03/27/2021  DOB: 02-11-1960 ? ?Follow-Up Visit Note ? ?CC: Pcp, No  Everitt Amber, MD ? ?  ICD-10-CM   ?1. Endometrial cancer ([facility])  C54.1   ?  ? ? 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 ?Interval Since Last Radiation: 4 years, 5 months, and 18 days  ? ?Radiation treatment dates: 09/07/2016 - 10/07/2016, 4 years and 9 months since her surgery ?  ?Site/dose: Vaginal vault / 30 Gy in 5 fractions,  6 Gy per treatment ? ?Narrative:  The patient returns today for routine 6 month follow-up, she as last seen here for follow-up on 07/12/20. Since her last visit, the patient followed up with Dr. Denman George on 09/25/20. During which time, the patient was noted to tolerate therapy fairly well other than persistent neuropathy. She also reported female pattern baldness, facial hirsutism, and increased LE edema. Given her LE edema, Dr. Denman George ordered a CT scan to rule out enlarging pelvic masses or lymph nodes.  ? ?Subsequent CT of the abdomen and pelvis on 10/02/20 showed no findings suggestive of recurrent or metastatic disease in the abdomen/pelvis. No findings were appreciated along the pelvic sidewall to explain the patient's history of new left lower extremity edema.  ? ?Of note: patient underwent a colonoscopy on 11/07/20 which showed no evidence of malignancy and tubular adenomas.             ? ?Otherwise, no significant interval history since the patient was last seen.  Dr. Denman George recommended that she follow-up today with me and then resume follow-up with her gynecologist on a yearly basis afterward since she is far out from her treatment. ? ?She denies any pain within the pelvis area, vaginal bleeding, hematuria or rectal bleeding.  She denies any problems of the with abdominal bloating. ? ?Allergies:  has No Known Allergies. ? ?Meds: ?Current Outpatient Medications   ?Medication Sig Dispense Refill  ? ALPRAZolam (XANAX) 0.5 MG tablet Take 0.5-1 tablets (0.25-0.5 mg total) by mouth daily as needed for anxiety (FOR PANIC/ANXIETY). 5 tablet 0  ? citalopram (CELEXA) 40 MG tablet Take 40 mg daily by mouth.  1  ? ?No current facility-administered medications for this encounter.  ? ? ?Physical Findings: ?The patient is in no acute distress. Patient is alert and oriented.  Accompanied by her husband on evaluation today ? height is '5\' 3"'$  (1.6 m) and weight is 248 lb 6 oz (112.7 kg). Her temporal temperature is 96.8 ?F (36 ?C) (abnormal). Her blood pressure is 139/87 and her pulse is 70. Her respiration is 18 and oxygen saturation is 100%. .  No significant changes. Lungs are clear to auscultation bilaterally. Heart has regular rate and rhythm. No palpable cervical, supraclavicular, or axillary adenopathy. Abdomen soft, non-tender, normal bowel sounds. ? ?On pelvic examination the external genitalia were unremarkable. A speculum exam was performed. There are no mucosal lesions noted in the vaginal vault.  On bimanual examination there were no pelvic masses appreciated.  Vaginal cuff intact. ? ? ?Lab Findings: ?Lab Results  ?Component Value Date  ? WBC 7.0 09/07/2018  ? HGB 8.8 (L) 09/07/2018  ? HCT 29.7 (L) 09/07/2018  ? MCV 83.7 09/07/2018  ? PLT 254 09/07/2018  ? ? ?Radiographic Findings: ?No results found. ? 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 ?No evidence of recurrence on clinical  exam today.  She does not exhibit any long-term effects from her surgery or vaginal brachytherapy. ? ?Plan: Patient has successfully completed follow-up in gynecologic oncology and radiation oncology.  She will resume follow-up with her gynecologist.  Recommended she see her gynecologist in approximately 6 months. ? ? ?25 minutes of total time was spent for this patient encounter, including preparation, face-to-face counseling with the patient and coordination of care,  physical exam, and documentation of the encounter. ?____________________________________ ? ?Blair Promise, PhD, MD ? ? ?This document serves as a record of services personally performed by Gery Pray, MD. It was created on his behalf by Roney Mans, a trained medical scribe. The creation of this record is based on the scribe's personal observations and the provider's statements to them. This document has been checked and approved by the attending provider. ? ?

## 2021-03-27 ENCOUNTER — Encounter: Payer: Self-pay | Admitting: Radiation Oncology

## 2021-03-27 ENCOUNTER — Ambulatory Visit
Admission: RE | Admit: 2021-03-27 | Discharge: 2021-03-27 | Disposition: A | Discharge status: Home / Self Care | Payer: Managed Care, Other (non HMO) | Source: Ambulatory Visit | Attending: Radiation Oncology | Admitting: Radiation Oncology

## 2021-03-27 ENCOUNTER — Other Ambulatory Visit: Payer: Self-pay

## 2021-03-27 DIAGNOSIS — Z923 Personal history of irradiation: Secondary | ICD-10-CM | POA: Diagnosis not present

## 2021-03-27 DIAGNOSIS — Z8542 Personal history of malignant neoplasm of other parts of uterus: Secondary | ICD-10-CM | POA: Insufficient documentation

## 2021-03-27 NOTE — Progress Notes (Signed)
Misty [name] is here today for follow up post radiation to the pelvic. ? ?They completed their radiation on: 10/07/2016  ? ?Does the patient complain of any of the following: ? ?Pain:no ?Abdominal bloating: no ?Diarrhea/Constipation: occasional gas and constipation.  ?Nausea/Vomiting: no ?Vaginal Discharge: no ?Blood in Urine or Stool: no ?Urinary Issues (dysuria/incomplete emptying/ incontinence/ increased frequency/urgency): no ?Does patient report using vaginal dilator 2-3 times a week and/or sexually active 2-3 weeks: Yes, patient reports being sexually active. ?Post radiation skin changes: no ? ? ?Additional comments if applicable:  ? ?Vitals:  ? 03/27/21 1103  ?BP: 139/87  ?Pulse: 70  ?Resp: 18  ?Temp: (!) 96.8 ?F (36 ?C)  ?TempSrc: Temporal  ?SpO2: 100%  ?Weight: 248 lb 6 oz (112.7 kg)  ?Height: '5\' 3"'$  (1.6 m)  ?  ? ? ?
